# Patient Record
Sex: Male | Born: 1935
Health system: Southern US, Community
[De-identification: ages and names within clinical notes are randomized; demographics above are authoritative.]

## PROBLEM LIST (undated history)

## (undated) DIAGNOSIS — F32A Depression, unspecified: Secondary | ICD-10-CM

## (undated) DIAGNOSIS — J449 Chronic obstructive pulmonary disease, unspecified: Secondary | ICD-10-CM

## (undated) DIAGNOSIS — I10 Essential (primary) hypertension: Secondary | ICD-10-CM

## (undated) DIAGNOSIS — F319 Bipolar disorder, unspecified: Secondary | ICD-10-CM

## (undated) DIAGNOSIS — N400 Enlarged prostate without lower urinary tract symptoms: Secondary | ICD-10-CM

## (undated) DIAGNOSIS — N189 Chronic kidney disease, unspecified: Secondary | ICD-10-CM

## (undated) DIAGNOSIS — F329 Major depressive disorder, single episode, unspecified: Secondary | ICD-10-CM

## (undated) DIAGNOSIS — C801 Malignant (primary) neoplasm, unspecified: Secondary | ICD-10-CM

## (undated) DIAGNOSIS — E119 Type 2 diabetes mellitus without complications: Secondary | ICD-10-CM

## (undated) DIAGNOSIS — H269 Unspecified cataract: Secondary | ICD-10-CM

## (undated) HISTORY — DX: Benign prostatic hyperplasia without lower urinary tract symptoms: N40.0

## (undated) HISTORY — DX: Chronic obstructive pulmonary disease, unspecified: J44.9

## (undated) HISTORY — DX: Essential (primary) hypertension: I10

## (undated) HISTORY — DX: Chronic kidney disease, unspecified: N18.9

## (undated) HISTORY — DX: Type 2 diabetes mellitus without complications: E11.9

## (undated) HISTORY — DX: Unspecified cataract: H26.9

## (undated) HISTORY — DX: Bipolar disorder, unspecified: F31.9

## (undated) HISTORY — DX: Malignant (primary) neoplasm, unspecified: C80.1

## (undated) HISTORY — DX: Depression, unspecified: F32.A

## (undated) HISTORY — DX: Major depressive disorder, single episode, unspecified: F32.9

---

## 2000-10-23 ENCOUNTER — Inpatient Hospital Stay (HOSPITAL_COMMUNITY): Admission: EM | Admit: 2000-10-23 | Discharge: 2000-10-25 | Payer: Self-pay | Admitting: Emergency Medicine

## 2000-10-23 ENCOUNTER — Encounter: Payer: Self-pay | Admitting: Neurology

## 2000-10-23 ENCOUNTER — Encounter: Payer: Self-pay | Admitting: Emergency Medicine

## 2000-10-24 ENCOUNTER — Encounter: Payer: Self-pay | Admitting: Neurology

## 2001-07-18 ENCOUNTER — Ambulatory Visit (HOSPITAL_COMMUNITY): Admission: RE | Admit: 2001-07-18 | Discharge: 2001-07-18 | Payer: Self-pay | Admitting: *Deleted

## 2001-07-18 ENCOUNTER — Encounter (INDEPENDENT_AMBULATORY_CARE_PROVIDER_SITE_OTHER): Payer: Self-pay | Admitting: *Deleted

## 2004-09-10 ENCOUNTER — Ambulatory Visit (HOSPITAL_COMMUNITY): Admission: RE | Admit: 2004-09-10 | Discharge: 2004-09-10 | Payer: Self-pay | Admitting: *Deleted

## 2004-09-10 ENCOUNTER — Encounter (INDEPENDENT_AMBULATORY_CARE_PROVIDER_SITE_OTHER): Payer: Self-pay | Admitting: *Deleted

## 2007-03-22 ENCOUNTER — Ambulatory Visit (HOSPITAL_COMMUNITY): Admission: RE | Admit: 2007-03-22 | Discharge: 2007-03-22 | Payer: Self-pay | Admitting: Urology

## 2007-03-28 ENCOUNTER — Encounter (INDEPENDENT_AMBULATORY_CARE_PROVIDER_SITE_OTHER): Payer: Self-pay | Admitting: Urology

## 2007-03-28 ENCOUNTER — Inpatient Hospital Stay (HOSPITAL_COMMUNITY): Admission: RE | Admit: 2007-03-28 | Discharge: 2007-04-01 | Payer: Self-pay | Admitting: Urology

## 2007-09-24 ENCOUNTER — Ambulatory Visit (HOSPITAL_COMMUNITY): Admission: RE | Admit: 2007-09-24 | Discharge: 2007-09-24 | Payer: Self-pay | Admitting: Urology

## 2008-04-08 ENCOUNTER — Ambulatory Visit (HOSPITAL_COMMUNITY): Admission: RE | Admit: 2008-04-08 | Discharge: 2008-04-08 | Payer: Self-pay | Admitting: Urology

## 2008-10-10 ENCOUNTER — Ambulatory Visit (HOSPITAL_COMMUNITY): Admission: RE | Admit: 2008-10-10 | Discharge: 2008-10-10 | Payer: Self-pay | Admitting: Urology

## 2009-04-17 ENCOUNTER — Ambulatory Visit (HOSPITAL_COMMUNITY): Admission: RE | Admit: 2009-04-17 | Discharge: 2009-04-17 | Payer: Self-pay | Admitting: Urology

## 2009-11-17 ENCOUNTER — Ambulatory Visit (HOSPITAL_COMMUNITY): Admission: RE | Admit: 2009-11-17 | Discharge: 2009-11-17 | Payer: Self-pay | Admitting: Internal Medicine

## 2010-03-29 ENCOUNTER — Emergency Department (HOSPITAL_COMMUNITY): Admission: EM | Admit: 2010-03-29 | Discharge: 2010-03-29 | Payer: Self-pay | Admitting: Emergency Medicine

## 2010-12-14 NOTE — Consult Note (Signed)
NAME:  Blake Hines, Blake Hines NO.:  [id number]   MEDICAL RECORD NO.:  [id number]         PATIENT TYPE:  LINP   LOCATION:                               FACILITY:  [facility]   PHYSICIAN:  Felizardo Hoffmann, M.D.  DATE OF BIRTH:  [birth year]   DATE OF CONSULTATION:  03/30/2007  DATE OF DISCHARGE:                                 CONSULTATION   REASON FOR CONSULTATION:  Agitation.   REQUESTING PHYSICIAN:  Raynelle Bring, MD   HISTORY OF PRESENT ILLNESS:  Blake Hines is a 77 year old male admitted  to the [facility] on March 23, 2007, with a left renal mass   Blake Hines had been experiencing approximately 4 days of tangential  thought process with increased thought speed and expansive mood.  He has  also had elevated energy. He had been placed on Zyprexa 5 mg b.i.d.   His symptoms have improved.  He is now cooperative. He is not having any  thoughts of harming himself or others.  He is having no hallucinations  or delusions. He has constructive future goals   PAST PSYCHIATRIC HISTORY:  In review of the past medical record, in  10/27/2000, Blake Hines developed an alteration in his personality; he  had become more talkative with inappropriate comments. He also was  having memory difficulty at that time.   The patient was ultimately admitted to [facility]  for his manic psychosis.  He was treated with Zyprexa which was  eventually reduced down to a very small dose, but the patient has not  been taking the Zyprexa correctly.   FAMILY PSYCHIATRIC HISTORY:  One of the patient's children has bipolar  disorder.   The patient's father died of suicide.   SOCIAL HISTORY:  The patient has a history of drinking one mixed drink a  day   The patient is widowed.  His wife passed away in 10/28/03.  Occupation:  Retired English as a second language teacher.   [place]:  1. Type 2 diabetes.  2. Hypertension.  3. Benign prostatic hypertrophy.  4. History of colonic adenomas.  5.  Gilbert's syndrome.   ALLERGIES:  No known drug allergies.   MEDICATIONS:  The MAR is reviewed.  The patient is on Zyprexa 5 mg p.o.  b.i.d.   LABORATORY DATA:  TSH within normal limits.  Sodium 124, creatinine  1.59, BUN 7.  WBC 13.3, hemoglobin 12.4, platelet count 319.   REVIEW OF SYSTEMS:  Constitutional, head, eyes, ears, nose, throat,  mouth, neurologic, psychiatric, cardiovascular, respiratory,  gastrointestinal, genitourinary, skin, musculoskeletal, hematologic,  lymphatic, endocrine, metabolic all unremarkable.   PHYSICAL EXAMINATION:  VITAL SIGNS:  Temperature 98.1, pulse 109,  respiratory rate 20, blood pressure 136/88, O2 saturation on 2 liters  98%.  GENERAL APPEARANCE:  Blake Hines is an elderly male reclining in a supine  position in his hospital bed with no abnormal involuntary movements.   MENTAL STATUS EXAM:  Blake Hines is alert.  He is oriented to all  spheres.  His eye contact is good.  His attention span is within normal  limits.  His concentration is within normal limits.  His mood is  slightly anxious.  His affect is slightly anxious. He is oriented to all  spheres.  His fund of knowledge and intelligence are grossly within  normal limits.  Speech involves normal rate and prosody without  dysarthria.  Thought process is logical, coherent and goal directed.  No  looseness of associations.  Thought content:  No thoughts of harming  himself.  No thoughts of harming others. No delusions, no  hallucinations.  Insight is intact. Judgment is intact.   ASSESSMENT:  AXIS I:  (293.83) Mood disorder not otherwise specified  (history of manic symptoms accompanied with memory dysfunction but  recurring in a functional pattern similar to bipolar disorder). Now  stable after restarting Zyprexa.  AXIS II:  None.  AXIS III:  See Past Medical History.  AXIS IV:  General medical, primary support group.  AXIS V:  55.   Blake Hines is not at risk to harm himself or others.   He agrees to call  emergency services immediately for any psychiatric emergency symptoms.   RECOMMENDATIONS:  The patient's Zyprexa can be reduced to 5 mg p.o.  nightly for anti-psychosis and anti-mania as well as prevention.   Would have the patient follow up with one of the intensive outpatient  programs at [facility], [facility] or [facility]  psychiatric departments.      Felizardo Hoffmann, M.D.  Electronically Signed     JW/MEDQ  D:  12/09/2007  T:  12/09/2007  Job:  [id number]

## 2010-12-14 NOTE — Op Note (Signed)
NAME:  BRITT, BORST NO.:  [id number]   MEDICAL RECORD NO.:  [id number]          PATIENT TYPE:  INP   LOCATION:  [id number]                         FACILITY:  [facility]   PHYSICIAN:  Raynelle Bring, MD      DATE OF BIRTH:  25-Aug-1930   DATE OF PROCEDURE:  03/28/2007  DATE OF DISCHARGE:                               OPERATIVE REPORT   PREOPERATIVE DIAGNOSES:  1. Left renal mass.  2. Hematuria.   POSTOPERATIVE DIAGNOSES:  1. Left renal mass.  2. Hematuria.   PROCEDURE:  1. Left laparoscopic nephroureterectomy.  2. Retroperitoneal lymph node dissection (limited).   SURGEON:  Dr. Raynelle Bring.   ASSISTANT:  Dr. Finis Bud.   ANESTHESIA:  General.   COMPLICATIONS:  None.   ESTIMATED BLOOD LOSS:  175 mL.   SPECIMENS:  1. Left kidney and ureter with bladder cuff.  2. Periaortic and left renal hilar lymph nodes.   Disposition of specimens: Pathology.   DRAINS:  Number 15 Blake pelvic drain.   INDICATIONS:  Mr. Kaster is a 77 year old gentleman who recently  presented with hematuria and left-sided flank pain.  He underwent a CT  scan which demonstrated a left renal mass that was centrally located and  worrisome for a urothelial carcinoma versus possible centrally located  renal cell carcinoma.  He underwent cystoscopy which demonstrated no  bladder abnormalities, and his right kidney and ureter did not  demonstrate any concerning findings.  The patient underwent metastatic  evaluation which was negative except for some borderline retroperitoneal  lymphadenopathy which was concerning but not definite for metastatic  disease.  After discussing management options, the patient elected to  proceed with the above procedures.  Potential risks, complications and  alternative options were discussed with the patient.  Informed consent  was obtained.   DESCRIPTION OF PROCEDURE:  The patient was taken to the operating room,  and a general anesthetic was administered.   He was given preoperative  antibiotics, placed in the left modified flank position.  The patient's  abdomen was prepped and draped in the usual sterile fashion.  A  preoperative time-out was performed.  A site was selected just superior  to the umbilicus for placement of the camera port.  This was placed  using a standard open Hassan technique which allowed entry into the  peritoneal cavity under direct vision without difficulty.  A 10-mm  Hassan port was then placed and secured with 0 Vicryl fascial sutures.  A pneumoperitoneum was established, and the 30-degree lens was used to  inspect the abdomen which did not demonstrate any evidence of intra-  abdominal injuries or other abnormalities.  A 12-mm port was then placed  in the left lower quadrant and a 5-mm port was placed in the left upper  quadrant.  With the aid of the harmonic scalpel, the white line of Toldt  was incised along the length of the descending colon allowing the colon  be mobilized medially and the plane between the anterior layer of  Gerota's fascia and the colonic mesentery to be developed.  The patient  was noted on his preoperative imaging to have a urine leak medially  likely a result from his obstruction.  Therefore, an attempt was made to  stay out of Gerota's fascia.  The ureter and gonadal vein were  identified and lifted anteriorly off the psoas fascia.  There was noted  to be significant edema within the retroperitoneal fat.  However, the  dissection proceeded without tremendous difficulty.  Dissection then  continued superiorly toward the renal hilum.  The renal vein was  identified within the retroperitoneal fat.  Just below the level of the  renal vein, there was noted to be some lymphadenopathy.  The lymph node  tissue between the left renal vein, aorta and ureter was removed with  Hem-o-lok clips used for lymphostasis and hemostasis.  This was sent as  a permanent pathologic specimen.  A small lower pole  renal artery was  identified and was divided between multiple Hem-o-lok clips.  Dissection  then turned to the renal hilum where the main renal artery was  identified and ligated with multiple Hem-o-lok clips and was divided.  The renal vein was then isolated and divided with a 45-mm flex ETS  stapler with a vascular staple load.  This resulted in good hemostasis,  and Gerota's fascia was then intentionally entered superiorly allowing  the adrenal gland to be intentionally preserved.  The remainder of the  kidney was then dissected free with Gerota's fascia intact.  The gonadal  vein was then divided between multiple Hem-o-lok clips, and the ureter  was dissected inferiorly until it crossed the iliac vessels.  At this  point, the patient was placed in Trendelenburg position.  An additional  12-mm port was placed in the lower midline, and an additional 5-mm port  was placed in the left lower quadrant.  The bladder was then reflected  posteriorly, and the space of Retzius was entered on left side of the  bladder.  The ureter was dissected into the pelvis.  The vas deferens  was identified and divided between Hem-o-lok clips, and the superior  vesical artery on this side was identified and dissected and was divided  between multiple Hem-o-lok clips.  This allowed the ureter to be freed  down to a point just above its entry into the bladder.  At this point, 0  Vicryl sutures were used to close the 12-mm port sites.  The renal hilum  and adrenal gland were inspected prior to removal of ports and appeared  to be hemostatic.  A piece of Surgicel had been placed over the adrenal  bed previously.  All ports were then removed from the abdomen under  direct vision.  The 12-mm port site in the lower midline was then  extended allowing an opening just large enough for the kidney to be  removed from the abdomen.  A Bookwalter self-retaining retractor was  then placed.  The ureter was further dissected  until it was clearly  entering the bladder.  A figure-of-eight 2-0 Vicryl stitch was placed  well around the ureteral hiatus.  The ureter was then dissected off the  bladder, and the bladder was opened with Metzenbaum scissors, resulting  in a nice bladder cuff.  Unfortunately, the figure-of-eight suture was  also cut, and therefore, the opening in the bladder was closed with a  figure-of-eight 2-0 Vicryl suture followed by a second imbricating layer  of interrupted 2-0 Vicryl sutures.  The bladder was then filled with 200  mL of sterile saline, and no  leak was demonstrated.  Hemostasis appeared  excellent at this point.  A #15 Blake drain was brought through the left  lower quadrant 5-mm port site and positioned in the space of Retzius.  It was secured to the skin with a nylon suture.  The lower midline  incision was then closed with a running #1 PDS suture.  The superficial  wound was then  copiously irrigated, and all incision sites were closed with staples.  Sterile dressings were applied.  The patient appeared to tolerate the  procedure well and without complications.  He was able to be awakened  and transferred to the recovery unit in satisfactory condition.      Raynelle Bring, MD  Electronically Signed     LB/MEDQ  D:  03/28/2007  T:  03/29/2007  Job:  [id number]

## 2010-12-14 NOTE — Consult Note (Signed)
NAME:  BRIANNE, MUHS NO.:  [id number]   MEDICAL RECORD NO.:  [id number]          PATIENT TYPE:  INP   LOCATION:  [place]                         FACILITY:  [facility]   PHYSICIAN:  Mark A. Perini, M.D.   DATE OF BIRTH:  [birth year]   DATE OF CONSULTATION:  03/29/2007  DATE OF DISCHARGE:                                 CONSULTATION   REQUESTING PHYSICIAN:  Dr. Dutch Gray.   REASON FOR CONSULTATION:  Medical followup and mania.   HISTORY OF PRESENT ILLNESS:  Desean is a 77 year old male who presented  with gross hematuria last week.  He was found to have a renal mass  suspicious for carcinoma and underwent resection yesterday.  He states  his pain is well-controlled today; however, it has been noticed that he  has had increasing change in his affect with pressured speech as well.   PAST MEDICAL HISTORY:  1. Hypertension since 11/03/1990.  2. Dyslipidemia.  3. Psychotic manic episode in November 02, 2000 requiring inpatient stay at [facility].  He had good response to Zyprexa; however, he has not      been taking Zyprexa very much over the last year and when he does      take it, he takes a very small dose on the order of 1 to 1.5 mg in      the evening.  4. Type 2 diabetes.  5. Colonic adenomas.  6. Benign prostatic hypertrophy.  7. Non-melanoma skin cancer.  8. Gilbert's syndrome.  9. Right lower extremity sciatica in the past.   ALLERGIES:  LESCOL caused weakness.  MSG gives him problems.  ZOCOR  caused back pain and LIPITOR caused back pain.   MEDICATIONS AT HOME:  1. Hydrochlorothiazide 50 mg daily.  2. Atacand 16 mg daily.  3. Pepcid as needed.  4. Aspirin 325 mg every other day.  5. Multivitamin daily.  6. Fish oil daily.  7. Crestor one-half of a 5-mg pill two times a week.   MEDICATIONS IN THE HOSPITAL:  1. Dilaudid PCA.  2. Hydrochlorothiazide 50 mg daily.  3. Zocor 10 mg daily.  4. Ancef.  5. Cipro.   P.R.N. MEDICATIONS:  1. Protonix 40 mg daily.  2.  Sudafed 60 mg.  3. Zyprexa 5 mg twice a day.   SOCIAL HISTORY:  His wife' Collie Siad died in 2003/11/03 of lung cancer.  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  He quit tobacco in 11-02-04, although  according to his daughter, he still smokes an occasional cigarette.  He  uses some alcohol and no drug use.   FAMILY HISTORY:  Father died at 20 of suicide.  Mother died at 48 of  stroke and was in a nursing home.  He has no siblings.   PHYSICAL EXAM:  VITAL SIGNS:  Temperature 98.9, afebrile, pulse 93,  respiratory 16, blood pressure 125/74, SAT 98% on 1 L of oxygen.  He had  2300 mL in, 1600 mL out.  GENERAL:  He is sitting in a chair.  He is awake, alert.  Speech  is a  bit pressure.  His thoughts are a bit disordered and he seems to jump  from topic to topic too readily.  LUNGS:  Clear to auscultation bilaterally with no wheezes, rales or  rhonchi.  HEART:  Regular rate and rhythm with no murmur, rub or gallop.  ABDOMEN:  Soft, nontender.  He has wounds that are bandaged.  The  dressings are clean and dry.  EXTREMITIES:  He has no edema.  He moves extremities x4.   LABORATORY DATA:  White count 9.1, hemoglobin 12.4, platelet count  345,000.  Sodium 132, potassium 3.4, chloride 97, CO2 30, BUN 14,  creatinine 1.57, glucose 153.  GFR 43.  Calcium 7.8.   ASSESSMENT AND PLAN:  1. Renal mass and hematuria, status post resection, postoperative      management per Urology.  2. Hypertension.  He has always insisted on continuing on the higher-      dose hydrochlorothiazide; however, I will decrease it to 25 mg      daily, given his mild hyponatremia.  3. Nasal congestion.  I am concerned about using Sudafed, given his      hypertension history.  We will change to just nasal saline for now.  4. History of mania and psychosis.  He seems a bit manic at this time.      We will resume Zyprexa 5 mg twice daily and Dr. Rhona Raider is being      consulted as well.  5. Type 2 diabetes.  We will check his sugars  and follow along.  6. Pain control per Urology.  The timing of the discontinuation of the      patient-controlled analgesic pump will be per Urology.  7. We will replace potassium chloride.  We will follow with you.           ______________________________  Jeannette How. Joylene Draft, M.D.     MAP/MEDQ  D:  03/30/2007  T:  03/31/2007  Job:  [id number]

## 2010-12-17 NOTE — H&P (Signed)
[facility]  Patient:    Blake Hines, Blake Hines                          MRN: [id number] Adm. Date:  [id number] Attending:  Lenor Coffin CC:         Crist Infante, M.D.   History and Physical  HISTORY OF PRESENT ILLNESS:  Blake Hines is a 77 year old right-handed white male -- born 1931/06/10 -- with a history of progressive alteration in personality.  This patient was initially noted to become more talkative, which is out of character for him, about three to four weeks ago.  Within the last five days, patient has become inappropriate with a lot of his comments, that has significantly worsened over the last two days prior to this admission. Patient has been somewhat forgetful, cannot remember where important business papers have been kept and has lost his billfold.  The patient is laughing inappropriately quite a bit.  The problem has continually worsened and this patient is brought in for an evaluation.  CT of the head is unremarkable. Neurology was asked to see the patient for an evaluation.  PAST MEDICAL HISTORY: 1. History of altered personality, unusual affect, as above. 2. History of hypertension. 3. Tobacco abuse.  MEDICATIONS:  Medications at this time include hydrochlorothiazide 25 mg a day.  SOCIAL HISTORY:  Tobacco:  One-half pack of cigarettes a day.  Alcohol:  One drink of bourbon daily.  According to the wife, patient does not drink to excess.  This patient lives in the [place] area, is employed, lives with his wife, has two children.  One daughter is alive and well.  One son has a history of bipolar disorder.  ALLERGIES:  Patient has no known allergies.  FAMILY MEDICAL HISTORY:  Notable that mother has history of recurrent bladder infections and GU sepsis.  Father died following suicide.  Patient has no brothers or sisters.  REVIEW OF SYSTEMS:  Notable for no fevers or chills.  Patient had the flu three weeks ago lasting  about a week.  Patient denies any headache.  Blurred vision is present but is mild.  Patient has some ongoing neck pain and neck stiffness.  Denies shortness of breath, chest pains.  Denies any problems with nausea or vomiting.  Has some trouble with bowels.  Denies any bladder control problems.  Denies blackout episodes, dizziness.  PHYSICAL EXAMINATION:  VITALS:  Blood pressure is 168/103.  Heart rate 106 and regular.  Respiratory rate 22.  Temperature:  Afebrile.  GENERAL:  This patient is a well-developed white male who is somewhat diaphoretic, nervous appearing at the time of examination.  HEENT:  Head is atraumatic.  Eyes:  Pupils are equal, round and react to light.  Disks are flat bilaterally.  NECK:  Supple.  No carotid bruits noted.  RESPIRATORY:  Clear to auscultation and percussion.  CARDIOVASCULAR:  Regular rate and rhythm without obvious murmurs or rubs.  EXTREMITIES:  Without significant edema.  ABDOMEN:  Positive bowel sounds.  No organomegaly or tenderness is noted.  NEUROLOGIC:  Cranial nerves as above.  Facial symmetry is present.  Patient has a normal pinprick sensation of the face, has full extraocular movements, has full visual fields.  Speech is well-enunciated and not aphasic.  Patient has normal strength in all four extremities.  Good and symmetric motor tone is noted throughout.  Sensory testing is intact to pinprick, soft touch and vibratory sensation  throughout.  Cerebellar testing reveals normal finger-to-nose-to-finger, heel-to-shin.  No pronator drift is seen.  Patient was not ambulated.  Deep tendon reflexes are symmetric.  Toes are downgoing bilaterally.  No asterixis was seen on todays evaluation.  LABORATORY AND X-RAY FINDINGS:  CT of the head is unremarkable.  Negative urine toxin screen.  Blood work reveals WBC of 8.4, hemoglobin 15.2, hematocrit 44.5, MCV of 85.1, platelets of 317,000; sodium 134, potassium 3.1, chloride of 99, CO2 27,  glucose of 97, BUN of 13, creatinine 1.1, calcium 9.1, total protein 6.4, albumin of 4.0, AST of 24, ALT of 27, alkaline phosphatase of 36, total bilirubin 1.7.  EKG and chest x-ray are pending at this time.  Blood gas reveals pH of 7.487, PCO2 of 37, PO2 of 79.  IMPRESSION: 1. History of personality alteration, etiology unclear. 2. History of hypertension.  Mini-Mental Status Examination shows that this patient is basically fully oriented, is able to recall three-of-three words at five minutes, has fair math function, repeats well, names well, can follow three-step commands. Patients speech is somewhat hesitant and slightly inappropriate.  By history, the patients alteration in personality occurred over several weeks.  Need to rule out a low-grade glioma process not apparent on CT, rule out small vessel ischemic changes and rule out primary psychiatric disease. Also need to consider possibility of metabolic disturbance like thyroid disease or possibility of alcohol withdrawal, although patient claims he does not drink more than one 1-ounce drink per night.  Patient has been on no other medications he might withdraw from.  PLAN: 1. Admission to [facility]. 2. MRI scan of the brain. 3. EEG study. 4. Check blood work for TSH, RPR, B12, folate level, sed rate, CEA level. 5. Administer thiamine and will follow patients clinical course while in    house. DD:  10/23/00 TD:  10/24/00 Job: [id number]

## 2010-12-17 NOTE — Discharge Summary (Signed)
[facility]  Patient:    Blake Hines, Blake Hines                          MRN: [id number] Adm. Date:  [id number] Disc. Date: [id number] Attending:  Lenor Coffin                           Discharge Summary  ADMITTING DIAGNOSES: 1. Behavioral alteration, etiology unclear. 2. Hypertension. 3. Tobacco abuse.  DISCHARGE DIAGNOSES: 1. Altered personality, possible depression DD:  10/25/00 TD:  10/25/00 Job: [id number]

## 2010-12-17 NOTE — Discharge Summary (Signed)
NAMEBRITTIAN, Blake Hines NO.:  [id number]   MEDICAL RECORD NO.:  [id number]          PATIENT TYPE:  INP   LOCATION:  [id number]                         FACILITY:  [facility]   PHYSICIAN:  Raynelle Bring, MD      DATE OF BIRTH:  [birth year]   DATE OF ADMISSION:  03/28/2007  DATE OF DISCHARGE:  04/01/2007                               DISCHARGE SUMMARY   ADMISSION DIAGNOSES:  1. Hematuria.  2. Left renal mass.   DISCHARGE DIAGNOSES:  1. Hematuria.  2. Renal cell carcinoma.   HISTORY AND PHYSICAL:  For full details, please see admission History  and Physical.  Briefly, Mr. Blake Hines is a 77 year old gentleman who  presented with gross hematuria and left-sided flank pain.  On  evaluation, he was found to have an enhancing left renal mass worrisome  for a malignancy.  Due to the central location of the patient's renal  mass, this was felt to represent a possible urothelial carcinoma versus  a centrally located renal cell carcinoma.  He underwent a metastatic  evaluation which did demonstrate some retroperitoneal lymphadenopathy,  although nothing that was definitive for metastatic disease.  The  remainder of his metastatic evaluation was negative.  After discussing  options, the patient elected to proceed with a left nephroureterectomy  due to the high likelihood that this could be a urothelial carcinoma.   HOSPITAL COURSE:  On March 28, 2007, the patient was taken to the  operating room, and a left laparoscopic nephroureterectomy was  performed.  The patient tolerated this procedure well without  complications.  A regional lymphadenectomy was also performed for  staging purposes.  Postoperatively, the patient was able to be  transferred to a regular hospital room following recovery from  anesthesia.  He was monitored carefully and remained hemodynamically  stable over the first 24 hours.  Specifically, his hematocrit remained  stable at 35.4.  He was noted to have and an  increase in his serum  creatinine which stabilized. Toward the end of his hospitalization, it  was found to be 1.78 on April 01, 2007.   On postoperative day #1, the patient was also noted to exhibit pressured  speech as well as demonstrating sleeplessness which had been consistent  even in the days preceding surgery according to his daughter.  He did  have a known history of bipolar disorder with significant manic episodes  in the past which have been managed by his primary care physician, Dr.  Crist Hines.  Dr. Joylene Hines was, therefore, consulted and did recommend  that the patient restart his Zyprexa which he had been hesitant to take  on a regular basis in the past.   Over the course of the next a couple of days, the patient's diet was  gradually advanced as tolerated.  He was able to begin ambulating which  he did without difficulty, and his pain medication was able to be  transitioned to oral pain control.  A creatinine level was checked from  his pelvic drain and was found to be consistent with serum at 1.6.  His  drain  was, therefore, removed.  He also underwent a cystogram which did  not demonstrate any leak and, therefore, his Foley catheter was able to  be discontinued prior to his discharge.  By April 01, 2007, the patient  had met all discharge criteria and appeared to be stable from a medical  and psychiatric standpoint.  He was, therefore, able to be discharged  home in excellent condition.   SURGICAL PATHOLOGY:  The patient's pathology subsequently did  demonstrate a clear cell renal cell carcinoma with negative margins.  Regional lymph nodes were negative for metastatic disease.   DISPOSITION:  Home.   DISCHARGE MEDICATIONS:  The patient was given a prescription to take  Vicodin as needed for pain and Colace as a stool softener.  He was told  to resume his regular home medications except any aspirin, nonsteroidal  anti-inflammatory drugs, or herbal supplements.  He  was instructed to  resume his Zyprexa at 5 mg p.o. twice daily.   DISCHARGE INSTRUCTIONS:  The patient was instructed to refrain from any  heavy lifting, strenuous activity, or driving.  He was told to be on a  regular diet as tolerated.   FOLLOW UP:  Blake Hines will follow up in approximately 1 week for  removal of the staples and for further discussion of his surgical  pathology.  I also will check his renal function at that time.      Raynelle Bring, MD  Electronically Signed     LB/MEDQ  D:  04/02/2007  T:  04/02/2007  Job:  [id number]   cc:   Elta Guadeloupe A. Perini, M.D.  Fax: [phone]

## 2011-05-13 LAB — BASIC METABOLIC PANEL WITH GFR
BUN: 13
CO2: 32
Calcium: 8.3 — ABNORMAL LOW
Chloride: 87 — ABNORMAL LOW
Creatinine, Ser: 1.78 — ABNORMAL HIGH
GFR calc non Af Amer: 38 — ABNORMAL LOW
Glucose, Bld: 134 — ABNORMAL HIGH
Potassium: 3.8
Sodium: 126 — ABNORMAL LOW

## 2011-05-13 LAB — BASIC METABOLIC PANEL
BUN: 13
BUN: 16
CO2: 29
CO2: 29
CO2: 29
Calcium: 7.7 — ABNORMAL LOW
Calcium: 7.8 — ABNORMAL LOW
Calcium: 8.3 — ABNORMAL LOW
Calcium: 9.2
Chloride: 85 — ABNORMAL LOW
Chloride: 97
Creatinine, Ser: 1.5
Creatinine, Ser: 1.57 — ABNORMAL HIGH
Creatinine, Ser: 1.81 — ABNORMAL HIGH
GFR calc Af Amer: 53 — ABNORMAL LOW
GFR calc Af Amer: 55 — ABNORMAL LOW
GFR calc non Af Amer: 37 — ABNORMAL LOW
GFR calc non Af Amer: 46 — ABNORMAL LOW
Glucose, Bld: 116 — ABNORMAL HIGH
Glucose, Bld: 161 — ABNORMAL HIGH
Glucose, Bld: 264 — ABNORMAL HIGH
Potassium: 3.3 — ABNORMAL LOW
Potassium: 3.4 — ABNORMAL LOW
Sodium: 122 — ABNORMAL LOW
Sodium: 124 — ABNORMAL LOW
Sodium: 125 — ABNORMAL LOW
Sodium: 132 — ABNORMAL LOW

## 2011-05-13 LAB — CBC
HCT: 35.4 — ABNORMAL LOW
HCT: 35.4 — ABNORMAL LOW
HCT: 35.4 — ABNORMAL LOW
Hemoglobin: 12.4 — ABNORMAL LOW
Hemoglobin: 12.4 — ABNORMAL LOW
Hemoglobin: 14.9
MCHC: 34.1
MCHC: 35
MCV: 85.4
MCV: 86
MCV: 86
MCV: 86.5
Platelets: 319
Platelets: 378
RBC: 4.14 — ABNORMAL LOW
RDW: 11.8
RDW: 12.1
WBC: 13.3 — ABNORMAL HIGH
WBC: 18.5 — ABNORMAL HIGH
WBC: 9.1

## 2011-05-13 LAB — ABO/RH: ABO/RH(D): O POS

## 2011-05-13 LAB — DIFFERENTIAL
Basophils Absolute: 0
Basophils Relative: 0
Eosinophils Relative: 1
Monocytes Absolute: 0.8 — ABNORMAL HIGH

## 2011-05-13 LAB — TYPE AND SCREEN

## 2011-08-17 DIAGNOSIS — C649 Malignant neoplasm of unspecified kidney, except renal pelvis: Secondary | ICD-10-CM | POA: Diagnosis not present

## 2011-10-06 DIAGNOSIS — R7989 Other specified abnormal findings of blood chemistry: Secondary | ICD-10-CM | POA: Diagnosis not present

## 2011-10-06 DIAGNOSIS — E785 Hyperlipidemia, unspecified: Secondary | ICD-10-CM | POA: Diagnosis not present

## 2011-12-05 DIAGNOSIS — N184 Chronic kidney disease, stage 4 (severe): Secondary | ICD-10-CM | POA: Diagnosis not present

## 2011-12-05 DIAGNOSIS — I10 Essential (primary) hypertension: Secondary | ICD-10-CM | POA: Diagnosis not present

## 2011-12-05 DIAGNOSIS — F329 Major depressive disorder, single episode, unspecified: Secondary | ICD-10-CM | POA: Diagnosis not present

## 2011-12-05 DIAGNOSIS — E119 Type 2 diabetes mellitus without complications: Secondary | ICD-10-CM | POA: Diagnosis not present

## 2012-04-06 DIAGNOSIS — Z125 Encounter for screening for malignant neoplasm of prostate: Secondary | ICD-10-CM | POA: Diagnosis not present

## 2012-04-06 DIAGNOSIS — E119 Type 2 diabetes mellitus without complications: Secondary | ICD-10-CM | POA: Diagnosis not present

## 2012-04-06 DIAGNOSIS — E785 Hyperlipidemia, unspecified: Secondary | ICD-10-CM | POA: Diagnosis not present

## 2012-04-06 DIAGNOSIS — I10 Essential (primary) hypertension: Secondary | ICD-10-CM | POA: Diagnosis not present

## 2012-04-12 DIAGNOSIS — E119 Type 2 diabetes mellitus without complications: Secondary | ICD-10-CM | POA: Diagnosis not present

## 2012-04-12 DIAGNOSIS — J449 Chronic obstructive pulmonary disease, unspecified: Secondary | ICD-10-CM | POA: Diagnosis not present

## 2012-04-12 DIAGNOSIS — Z23 Encounter for immunization: Secondary | ICD-10-CM | POA: Diagnosis not present

## 2012-04-12 DIAGNOSIS — Z125 Encounter for screening for malignant neoplasm of prostate: Secondary | ICD-10-CM | POA: Diagnosis not present

## 2012-04-12 DIAGNOSIS — N184 Chronic kidney disease, stage 4 (severe): Secondary | ICD-10-CM | POA: Diagnosis not present

## 2012-04-12 DIAGNOSIS — Z1212 Encounter for screening for malignant neoplasm of rectum: Secondary | ICD-10-CM | POA: Diagnosis not present

## 2012-04-12 DIAGNOSIS — Z0000 Encounter for general adult medical examination without abnormal findings: Secondary | ICD-10-CM | POA: Diagnosis not present

## 2012-04-16 ENCOUNTER — Ambulatory Visit (HOSPITAL_COMMUNITY)
Admission: RE | Admit: 2012-04-16 | Discharge: 2012-04-16 | Disposition: A | Discharge status: Home / Self Care | Payer: Medicare Other | Source: Ambulatory Visit | Attending: Urology | Admitting: Urology

## 2012-04-16 ENCOUNTER — Other Ambulatory Visit (HOSPITAL_COMMUNITY): Payer: Self-pay | Admitting: Urology

## 2012-04-16 DIAGNOSIS — C649 Malignant neoplasm of unspecified kidney, except renal pelvis: Secondary | ICD-10-CM

## 2012-04-17 DIAGNOSIS — N281 Cyst of kidney, acquired: Secondary | ICD-10-CM | POA: Diagnosis not present

## 2012-04-17 DIAGNOSIS — C649 Malignant neoplasm of unspecified kidney, except renal pelvis: Secondary | ICD-10-CM | POA: Diagnosis not present

## 2012-04-20 DIAGNOSIS — C649 Malignant neoplasm of unspecified kidney, except renal pelvis: Secondary | ICD-10-CM | POA: Diagnosis not present

## 2012-10-12 DIAGNOSIS — C649 Malignant neoplasm of unspecified kidney, except renal pelvis: Secondary | ICD-10-CM | POA: Diagnosis not present

## 2013-01-23 DIAGNOSIS — E119 Type 2 diabetes mellitus without complications: Secondary | ICD-10-CM | POA: Diagnosis not present

## 2013-04-22 DIAGNOSIS — E119 Type 2 diabetes mellitus without complications: Secondary | ICD-10-CM | POA: Diagnosis not present

## 2013-04-22 DIAGNOSIS — Z125 Encounter for screening for malignant neoplasm of prostate: Secondary | ICD-10-CM | POA: Diagnosis not present

## 2013-04-22 DIAGNOSIS — E785 Hyperlipidemia, unspecified: Secondary | ICD-10-CM | POA: Diagnosis not present

## 2013-04-22 DIAGNOSIS — I10 Essential (primary) hypertension: Secondary | ICD-10-CM | POA: Diagnosis not present

## 2013-05-03 DIAGNOSIS — Z6828 Body mass index (BMI) 28.0-28.9, adult: Secondary | ICD-10-CM | POA: Diagnosis not present

## 2013-05-03 DIAGNOSIS — Z125 Encounter for screening for malignant neoplasm of prostate: Secondary | ICD-10-CM | POA: Diagnosis not present

## 2013-05-03 DIAGNOSIS — Z23 Encounter for immunization: Secondary | ICD-10-CM | POA: Diagnosis not present

## 2013-05-03 DIAGNOSIS — Z1331 Encounter for screening for depression: Secondary | ICD-10-CM | POA: Diagnosis not present

## 2013-05-03 DIAGNOSIS — Z0000 Encounter for general adult medical examination without abnormal findings: Secondary | ICD-10-CM | POA: Diagnosis not present

## 2013-05-03 DIAGNOSIS — F329 Major depressive disorder, single episode, unspecified: Secondary | ICD-10-CM | POA: Diagnosis not present

## 2013-05-03 DIAGNOSIS — E119 Type 2 diabetes mellitus without complications: Secondary | ICD-10-CM | POA: Diagnosis not present

## 2013-05-03 DIAGNOSIS — E785 Hyperlipidemia, unspecified: Secondary | ICD-10-CM | POA: Diagnosis not present

## 2013-05-03 DIAGNOSIS — N184 Chronic kidney disease, stage 4 (severe): Secondary | ICD-10-CM | POA: Diagnosis not present

## 2013-05-03 DIAGNOSIS — J449 Chronic obstructive pulmonary disease, unspecified: Secondary | ICD-10-CM | POA: Diagnosis not present

## 2013-05-07 DIAGNOSIS — Z1212 Encounter for screening for malignant neoplasm of rectum: Secondary | ICD-10-CM | POA: Diagnosis not present

## 2013-11-11 DIAGNOSIS — I10 Essential (primary) hypertension: Secondary | ICD-10-CM | POA: Diagnosis not present

## 2013-11-11 DIAGNOSIS — N184 Chronic kidney disease, stage 4 (severe): Secondary | ICD-10-CM | POA: Diagnosis not present

## 2013-11-11 DIAGNOSIS — J449 Chronic obstructive pulmonary disease, unspecified: Secondary | ICD-10-CM | POA: Diagnosis not present

## 2013-11-11 DIAGNOSIS — Z6827 Body mass index (BMI) 27.0-27.9, adult: Secondary | ICD-10-CM | POA: Diagnosis not present

## 2013-11-11 DIAGNOSIS — E785 Hyperlipidemia, unspecified: Secondary | ICD-10-CM | POA: Diagnosis not present

## 2013-11-11 DIAGNOSIS — E119 Type 2 diabetes mellitus without complications: Secondary | ICD-10-CM | POA: Diagnosis not present

## 2013-11-20 DIAGNOSIS — Z23 Encounter for immunization: Secondary | ICD-10-CM | POA: Diagnosis not present

## 2014-04-24 DIAGNOSIS — Z23 Encounter for immunization: Secondary | ICD-10-CM | POA: Diagnosis not present

## 2014-04-28 DIAGNOSIS — L570 Actinic keratosis: Secondary | ICD-10-CM | POA: Diagnosis not present

## 2014-04-28 DIAGNOSIS — Z85828 Personal history of other malignant neoplasm of skin: Secondary | ICD-10-CM | POA: Diagnosis not present

## 2014-04-28 DIAGNOSIS — L821 Other seborrheic keratosis: Secondary | ICD-10-CM | POA: Diagnosis not present

## 2014-05-20 DIAGNOSIS — Z125 Encounter for screening for malignant neoplasm of prostate: Secondary | ICD-10-CM | POA: Diagnosis not present

## 2014-05-20 DIAGNOSIS — E785 Hyperlipidemia, unspecified: Secondary | ICD-10-CM | POA: Diagnosis not present

## 2014-05-20 DIAGNOSIS — E119 Type 2 diabetes mellitus without complications: Secondary | ICD-10-CM | POA: Diagnosis not present

## 2014-05-20 DIAGNOSIS — I10 Essential (primary) hypertension: Secondary | ICD-10-CM | POA: Diagnosis not present

## 2014-05-27 DIAGNOSIS — E119 Type 2 diabetes mellitus without complications: Secondary | ICD-10-CM | POA: Diagnosis not present

## 2014-05-27 DIAGNOSIS — Z1389 Encounter for screening for other disorder: Secondary | ICD-10-CM | POA: Diagnosis not present

## 2014-05-27 DIAGNOSIS — Z1212 Encounter for screening for malignant neoplasm of rectum: Secondary | ICD-10-CM | POA: Diagnosis not present

## 2014-05-27 DIAGNOSIS — E785 Hyperlipidemia, unspecified: Secondary | ICD-10-CM | POA: Diagnosis not present

## 2014-05-27 DIAGNOSIS — Z008 Encounter for other general examination: Secondary | ICD-10-CM | POA: Diagnosis not present

## 2014-05-27 DIAGNOSIS — H9190 Unspecified hearing loss, unspecified ear: Secondary | ICD-10-CM | POA: Diagnosis not present

## 2014-05-27 DIAGNOSIS — J449 Chronic obstructive pulmonary disease, unspecified: Secondary | ICD-10-CM | POA: Diagnosis not present

## 2014-05-27 DIAGNOSIS — I10 Essential (primary) hypertension: Secondary | ICD-10-CM | POA: Diagnosis not present

## 2014-05-27 DIAGNOSIS — N184 Chronic kidney disease, stage 4 (severe): Secondary | ICD-10-CM | POA: Diagnosis not present

## 2014-05-27 DIAGNOSIS — F329 Major depressive disorder, single episode, unspecified: Secondary | ICD-10-CM | POA: Diagnosis not present

## 2014-08-27 DIAGNOSIS — I10 Essential (primary) hypertension: Secondary | ICD-10-CM | POA: Diagnosis not present

## 2014-08-27 DIAGNOSIS — E119 Type 2 diabetes mellitus without complications: Secondary | ICD-10-CM | POA: Diagnosis not present

## 2014-08-27 DIAGNOSIS — Z6826 Body mass index (BMI) 26.0-26.9, adult: Secondary | ICD-10-CM | POA: Diagnosis not present

## 2014-10-08 DIAGNOSIS — E119 Type 2 diabetes mellitus without complications: Secondary | ICD-10-CM | POA: Diagnosis not present

## 2014-10-08 DIAGNOSIS — I10 Essential (primary) hypertension: Secondary | ICD-10-CM | POA: Diagnosis not present

## 2014-10-08 DIAGNOSIS — Z6827 Body mass index (BMI) 27.0-27.9, adult: Secondary | ICD-10-CM | POA: Diagnosis not present

## 2014-10-08 DIAGNOSIS — N184 Chronic kidney disease, stage 4 (severe): Secondary | ICD-10-CM | POA: Diagnosis not present

## 2014-11-24 DIAGNOSIS — Z1389 Encounter for screening for other disorder: Secondary | ICD-10-CM | POA: Diagnosis not present

## 2014-11-24 DIAGNOSIS — N184 Chronic kidney disease, stage 4 (severe): Secondary | ICD-10-CM | POA: Diagnosis not present

## 2014-11-24 DIAGNOSIS — I10 Essential (primary) hypertension: Secondary | ICD-10-CM | POA: Diagnosis not present

## 2014-11-24 DIAGNOSIS — F3173 Bipolar disorder, in partial remission, most recent episode manic: Secondary | ICD-10-CM | POA: Diagnosis not present

## 2014-11-24 DIAGNOSIS — J449 Chronic obstructive pulmonary disease, unspecified: Secondary | ICD-10-CM | POA: Diagnosis not present

## 2014-11-24 DIAGNOSIS — F329 Major depressive disorder, single episode, unspecified: Secondary | ICD-10-CM | POA: Diagnosis not present

## 2014-11-24 DIAGNOSIS — Z6826 Body mass index (BMI) 26.0-26.9, adult: Secondary | ICD-10-CM | POA: Diagnosis not present

## 2014-11-24 DIAGNOSIS — E1129 Type 2 diabetes mellitus with other diabetic kidney complication: Secondary | ICD-10-CM | POA: Diagnosis not present

## 2015-03-26 DIAGNOSIS — Z6826 Body mass index (BMI) 26.0-26.9, adult: Secondary | ICD-10-CM | POA: Diagnosis not present

## 2015-03-26 DIAGNOSIS — N184 Chronic kidney disease, stage 4 (severe): Secondary | ICD-10-CM | POA: Diagnosis not present

## 2015-03-26 DIAGNOSIS — E1129 Type 2 diabetes mellitus with other diabetic kidney complication: Secondary | ICD-10-CM | POA: Diagnosis not present

## 2015-03-26 DIAGNOSIS — I10 Essential (primary) hypertension: Secondary | ICD-10-CM | POA: Diagnosis not present

## 2015-03-26 DIAGNOSIS — E785 Hyperlipidemia, unspecified: Secondary | ICD-10-CM | POA: Diagnosis not present

## 2015-03-26 DIAGNOSIS — J449 Chronic obstructive pulmonary disease, unspecified: Secondary | ICD-10-CM | POA: Diagnosis not present

## 2015-03-26 DIAGNOSIS — F329 Major depressive disorder, single episode, unspecified: Secondary | ICD-10-CM | POA: Diagnosis not present

## 2015-04-07 DIAGNOSIS — Z23 Encounter for immunization: Secondary | ICD-10-CM | POA: Diagnosis not present

## 2015-04-29 DIAGNOSIS — L821 Other seborrheic keratosis: Secondary | ICD-10-CM | POA: Diagnosis not present

## 2015-04-29 DIAGNOSIS — L570 Actinic keratosis: Secondary | ICD-10-CM | POA: Diagnosis not present

## 2015-04-29 DIAGNOSIS — D1801 Hemangioma of skin and subcutaneous tissue: Secondary | ICD-10-CM | POA: Diagnosis not present

## 2015-06-01 DIAGNOSIS — Z6827 Body mass index (BMI) 27.0-27.9, adult: Secondary | ICD-10-CM | POA: Diagnosis not present

## 2015-06-01 DIAGNOSIS — J029 Acute pharyngitis, unspecified: Secondary | ICD-10-CM | POA: Diagnosis not present

## 2015-06-01 DIAGNOSIS — J209 Acute bronchitis, unspecified: Secondary | ICD-10-CM | POA: Diagnosis not present

## 2015-07-09 DIAGNOSIS — E1129 Type 2 diabetes mellitus with other diabetic kidney complication: Secondary | ICD-10-CM | POA: Diagnosis not present

## 2015-07-09 DIAGNOSIS — Z125 Encounter for screening for malignant neoplasm of prostate: Secondary | ICD-10-CM | POA: Diagnosis not present

## 2015-07-09 DIAGNOSIS — E784 Other hyperlipidemia: Secondary | ICD-10-CM | POA: Diagnosis not present

## 2015-07-09 DIAGNOSIS — N184 Chronic kidney disease, stage 4 (severe): Secondary | ICD-10-CM | POA: Diagnosis not present

## 2015-07-16 DIAGNOSIS — Z0000 Encounter for general adult medical examination without abnormal findings: Secondary | ICD-10-CM | POA: Diagnosis not present

## 2015-07-16 DIAGNOSIS — J449 Chronic obstructive pulmonary disease, unspecified: Secondary | ICD-10-CM | POA: Diagnosis not present

## 2015-07-16 DIAGNOSIS — Z1389 Encounter for screening for other disorder: Secondary | ICD-10-CM | POA: Diagnosis not present

## 2015-07-16 DIAGNOSIS — H9190 Unspecified hearing loss, unspecified ear: Secondary | ICD-10-CM | POA: Diagnosis not present

## 2015-07-16 DIAGNOSIS — N184 Chronic kidney disease, stage 4 (severe): Secondary | ICD-10-CM | POA: Diagnosis not present

## 2015-07-16 DIAGNOSIS — F329 Major depressive disorder, single episode, unspecified: Secondary | ICD-10-CM | POA: Diagnosis not present

## 2015-07-16 DIAGNOSIS — E1129 Type 2 diabetes mellitus with other diabetic kidney complication: Secondary | ICD-10-CM | POA: Diagnosis not present

## 2015-07-16 DIAGNOSIS — E784 Other hyperlipidemia: Secondary | ICD-10-CM | POA: Diagnosis not present

## 2015-07-16 DIAGNOSIS — F3173 Bipolar disorder, in partial remission, most recent episode manic: Secondary | ICD-10-CM | POA: Diagnosis not present

## 2015-07-16 DIAGNOSIS — Z6826 Body mass index (BMI) 26.0-26.9, adult: Secondary | ICD-10-CM | POA: Diagnosis not present

## 2015-09-08 DIAGNOSIS — E119 Type 2 diabetes mellitus without complications: Secondary | ICD-10-CM | POA: Diagnosis not present

## 2015-09-08 DIAGNOSIS — H524 Presbyopia: Secondary | ICD-10-CM | POA: Diagnosis not present

## 2015-12-14 DIAGNOSIS — E119 Type 2 diabetes mellitus without complications: Secondary | ICD-10-CM | POA: Diagnosis not present

## 2015-12-14 DIAGNOSIS — I10 Essential (primary) hypertension: Secondary | ICD-10-CM | POA: Diagnosis not present

## 2015-12-14 DIAGNOSIS — J449 Chronic obstructive pulmonary disease, unspecified: Secondary | ICD-10-CM | POA: Diagnosis not present

## 2015-12-14 DIAGNOSIS — R05 Cough: Secondary | ICD-10-CM | POA: Diagnosis not present

## 2015-12-14 DIAGNOSIS — Z6826 Body mass index (BMI) 26.0-26.9, adult: Secondary | ICD-10-CM | POA: Diagnosis not present

## 2015-12-14 DIAGNOSIS — J441 Chronic obstructive pulmonary disease with (acute) exacerbation: Secondary | ICD-10-CM | POA: Diagnosis not present

## 2015-12-14 DIAGNOSIS — E1129 Type 2 diabetes mellitus with other diabetic kidney complication: Secondary | ICD-10-CM | POA: Diagnosis not present

## 2015-12-18 DIAGNOSIS — J441 Chronic obstructive pulmonary disease with (acute) exacerbation: Secondary | ICD-10-CM | POA: Diagnosis not present

## 2015-12-18 DIAGNOSIS — Z6826 Body mass index (BMI) 26.0-26.9, adult: Secondary | ICD-10-CM | POA: Diagnosis not present

## 2016-01-19 DIAGNOSIS — F329 Major depressive disorder, single episode, unspecified: Secondary | ICD-10-CM | POA: Diagnosis not present

## 2016-01-19 DIAGNOSIS — Z1389 Encounter for screening for other disorder: Secondary | ICD-10-CM | POA: Diagnosis not present

## 2016-01-19 DIAGNOSIS — J441 Chronic obstructive pulmonary disease with (acute) exacerbation: Secondary | ICD-10-CM | POA: Diagnosis not present

## 2016-01-19 DIAGNOSIS — E1129 Type 2 diabetes mellitus with other diabetic kidney complication: Secondary | ICD-10-CM | POA: Diagnosis not present

## 2016-03-22 DIAGNOSIS — H1131 Conjunctival hemorrhage, right eye: Secondary | ICD-10-CM | POA: Diagnosis not present

## 2016-04-14 DIAGNOSIS — Z23 Encounter for immunization: Secondary | ICD-10-CM | POA: Diagnosis not present

## 2016-04-28 DIAGNOSIS — L821 Other seborrheic keratosis: Secondary | ICD-10-CM | POA: Diagnosis not present

## 2016-04-28 DIAGNOSIS — L570 Actinic keratosis: Secondary | ICD-10-CM | POA: Diagnosis not present

## 2016-04-28 DIAGNOSIS — Z85828 Personal history of other malignant neoplasm of skin: Secondary | ICD-10-CM | POA: Diagnosis not present

## 2016-07-29 DIAGNOSIS — N184 Chronic kidney disease, stage 4 (severe): Secondary | ICD-10-CM | POA: Diagnosis not present

## 2016-07-29 DIAGNOSIS — E784 Other hyperlipidemia: Secondary | ICD-10-CM | POA: Diagnosis not present

## 2016-07-29 DIAGNOSIS — E1129 Type 2 diabetes mellitus with other diabetic kidney complication: Secondary | ICD-10-CM | POA: Diagnosis not present

## 2016-07-29 DIAGNOSIS — Z125 Encounter for screening for malignant neoplasm of prostate: Secondary | ICD-10-CM | POA: Diagnosis not present

## 2016-07-29 DIAGNOSIS — R946 Abnormal results of thyroid function studies: Secondary | ICD-10-CM | POA: Diagnosis not present

## 2016-08-05 DIAGNOSIS — I10 Essential (primary) hypertension: Secondary | ICD-10-CM | POA: Diagnosis not present

## 2016-08-05 DIAGNOSIS — Z6827 Body mass index (BMI) 27.0-27.9, adult: Secondary | ICD-10-CM | POA: Diagnosis not present

## 2016-08-05 DIAGNOSIS — N184 Chronic kidney disease, stage 4 (severe): Secondary | ICD-10-CM | POA: Diagnosis not present

## 2016-08-05 DIAGNOSIS — E784 Other hyperlipidemia: Secondary | ICD-10-CM | POA: Diagnosis not present

## 2016-08-05 DIAGNOSIS — F3173 Bipolar disorder, in partial remission, most recent episode manic: Secondary | ICD-10-CM | POA: Diagnosis not present

## 2016-08-05 DIAGNOSIS — Z1389 Encounter for screening for other disorder: Secondary | ICD-10-CM | POA: Diagnosis not present

## 2016-08-05 DIAGNOSIS — J449 Chronic obstructive pulmonary disease, unspecified: Secondary | ICD-10-CM | POA: Diagnosis not present

## 2016-08-05 DIAGNOSIS — Z0000 Encounter for general adult medical examination without abnormal findings: Secondary | ICD-10-CM | POA: Diagnosis not present

## 2016-08-05 DIAGNOSIS — C649 Malignant neoplasm of unspecified kidney, except renal pelvis: Secondary | ICD-10-CM | POA: Diagnosis not present

## 2016-08-05 DIAGNOSIS — R808 Other proteinuria: Secondary | ICD-10-CM | POA: Diagnosis not present

## 2016-08-05 DIAGNOSIS — H9193 Unspecified hearing loss, bilateral: Secondary | ICD-10-CM | POA: Diagnosis not present

## 2016-08-05 DIAGNOSIS — E1129 Type 2 diabetes mellitus with other diabetic kidney complication: Secondary | ICD-10-CM | POA: Diagnosis not present

## 2017-01-04 DIAGNOSIS — H524 Presbyopia: Secondary | ICD-10-CM | POA: Diagnosis not present

## 2017-01-04 DIAGNOSIS — E119 Type 2 diabetes mellitus without complications: Secondary | ICD-10-CM | POA: Diagnosis not present

## 2017-01-04 DIAGNOSIS — H25043 Posterior subcapsular polar age-related cataract, bilateral: Secondary | ICD-10-CM | POA: Diagnosis not present

## 2017-01-04 DIAGNOSIS — H2513 Age-related nuclear cataract, bilateral: Secondary | ICD-10-CM | POA: Diagnosis not present

## 2017-02-02 DIAGNOSIS — R946 Abnormal results of thyroid function studies: Secondary | ICD-10-CM | POA: Diagnosis not present

## 2017-02-02 DIAGNOSIS — E1129 Type 2 diabetes mellitus with other diabetic kidney complication: Secondary | ICD-10-CM | POA: Diagnosis not present

## 2017-02-02 DIAGNOSIS — I10 Essential (primary) hypertension: Secondary | ICD-10-CM | POA: Diagnosis not present

## 2017-02-02 DIAGNOSIS — Z6826 Body mass index (BMI) 26.0-26.9, adult: Secondary | ICD-10-CM | POA: Diagnosis not present

## 2017-02-02 DIAGNOSIS — N184 Chronic kidney disease, stage 4 (severe): Secondary | ICD-10-CM | POA: Diagnosis not present

## 2017-02-02 DIAGNOSIS — F3173 Bipolar disorder, in partial remission, most recent episode manic: Secondary | ICD-10-CM | POA: Diagnosis not present

## 2017-04-04 DIAGNOSIS — H25043 Posterior subcapsular polar age-related cataract, bilateral: Secondary | ICD-10-CM | POA: Diagnosis not present

## 2017-04-04 DIAGNOSIS — H40013 Open angle with borderline findings, low risk, bilateral: Secondary | ICD-10-CM | POA: Diagnosis not present

## 2017-04-04 DIAGNOSIS — E119 Type 2 diabetes mellitus without complications: Secondary | ICD-10-CM | POA: Diagnosis not present

## 2017-04-04 DIAGNOSIS — H2513 Age-related nuclear cataract, bilateral: Secondary | ICD-10-CM | POA: Diagnosis not present

## 2017-04-05 DIAGNOSIS — Z23 Encounter for immunization: Secondary | ICD-10-CM | POA: Diagnosis not present

## 2017-04-18 DIAGNOSIS — H2513 Age-related nuclear cataract, bilateral: Secondary | ICD-10-CM | POA: Diagnosis not present

## 2017-04-18 DIAGNOSIS — H40013 Open angle with borderline findings, low risk, bilateral: Secondary | ICD-10-CM | POA: Diagnosis not present

## 2017-04-20 DIAGNOSIS — K648 Other hemorrhoids: Secondary | ICD-10-CM | POA: Diagnosis not present

## 2017-04-20 DIAGNOSIS — Z6826 Body mass index (BMI) 26.0-26.9, adult: Secondary | ICD-10-CM | POA: Diagnosis not present

## 2017-04-20 DIAGNOSIS — K921 Melena: Secondary | ICD-10-CM | POA: Diagnosis not present

## 2017-05-02 DIAGNOSIS — L821 Other seborrheic keratosis: Secondary | ICD-10-CM | POA: Diagnosis not present

## 2017-05-02 DIAGNOSIS — L570 Actinic keratosis: Secondary | ICD-10-CM | POA: Diagnosis not present

## 2017-05-25 DIAGNOSIS — H25042 Posterior subcapsular polar age-related cataract, left eye: Secondary | ICD-10-CM | POA: Diagnosis not present

## 2017-05-25 DIAGNOSIS — H2512 Age-related nuclear cataract, left eye: Secondary | ICD-10-CM | POA: Diagnosis not present

## 2017-05-25 DIAGNOSIS — H25812 Combined forms of age-related cataract, left eye: Secondary | ICD-10-CM | POA: Diagnosis not present

## 2017-05-25 DIAGNOSIS — H25012 Cortical age-related cataract, left eye: Secondary | ICD-10-CM | POA: Diagnosis not present

## 2017-06-29 DIAGNOSIS — H25811 Combined forms of age-related cataract, right eye: Secondary | ICD-10-CM | POA: Diagnosis not present

## 2017-06-29 DIAGNOSIS — H2511 Age-related nuclear cataract, right eye: Secondary | ICD-10-CM | POA: Diagnosis not present

## 2017-06-29 DIAGNOSIS — H25041 Posterior subcapsular polar age-related cataract, right eye: Secondary | ICD-10-CM | POA: Diagnosis not present

## 2017-06-29 DIAGNOSIS — H25011 Cortical age-related cataract, right eye: Secondary | ICD-10-CM | POA: Diagnosis not present

## 2017-08-02 DIAGNOSIS — R82998 Other abnormal findings in urine: Secondary | ICD-10-CM | POA: Diagnosis not present

## 2017-08-02 DIAGNOSIS — E1129 Type 2 diabetes mellitus with other diabetic kidney complication: Secondary | ICD-10-CM | POA: Diagnosis not present

## 2017-08-02 DIAGNOSIS — E7849 Other hyperlipidemia: Secondary | ICD-10-CM | POA: Diagnosis not present

## 2017-08-02 DIAGNOSIS — N184 Chronic kidney disease, stage 4 (severe): Secondary | ICD-10-CM | POA: Diagnosis not present

## 2017-08-02 DIAGNOSIS — Z125 Encounter for screening for malignant neoplasm of prostate: Secondary | ICD-10-CM | POA: Diagnosis not present

## 2017-08-09 DIAGNOSIS — E7849 Other hyperlipidemia: Secondary | ICD-10-CM | POA: Diagnosis not present

## 2017-08-09 DIAGNOSIS — I10 Essential (primary) hypertension: Secondary | ICD-10-CM | POA: Diagnosis not present

## 2017-08-09 DIAGNOSIS — H9193 Unspecified hearing loss, bilateral: Secondary | ICD-10-CM | POA: Diagnosis not present

## 2017-08-09 DIAGNOSIS — E1129 Type 2 diabetes mellitus with other diabetic kidney complication: Secondary | ICD-10-CM | POA: Diagnosis not present

## 2017-08-09 DIAGNOSIS — N184 Chronic kidney disease, stage 4 (severe): Secondary | ICD-10-CM | POA: Diagnosis not present

## 2017-08-09 DIAGNOSIS — J449 Chronic obstructive pulmonary disease, unspecified: Secondary | ICD-10-CM | POA: Diagnosis not present

## 2017-08-09 DIAGNOSIS — C649 Malignant neoplasm of unspecified kidney, except renal pelvis: Secondary | ICD-10-CM | POA: Diagnosis not present

## 2017-08-09 DIAGNOSIS — Z6826 Body mass index (BMI) 26.0-26.9, adult: Secondary | ICD-10-CM | POA: Diagnosis not present

## 2017-08-09 DIAGNOSIS — Z1389 Encounter for screening for other disorder: Secondary | ICD-10-CM | POA: Diagnosis not present

## 2017-08-09 DIAGNOSIS — R05 Cough: Secondary | ICD-10-CM | POA: Diagnosis not present

## 2017-08-09 DIAGNOSIS — Z0000 Encounter for general adult medical examination without abnormal findings: Secondary | ICD-10-CM | POA: Diagnosis not present

## 2017-08-15 DIAGNOSIS — Z1212 Encounter for screening for malignant neoplasm of rectum: Secondary | ICD-10-CM | POA: Diagnosis not present

## 2017-10-18 DIAGNOSIS — H401123 Primary open-angle glaucoma, left eye, severe stage: Secondary | ICD-10-CM | POA: Diagnosis not present

## 2017-10-18 DIAGNOSIS — H401112 Primary open-angle glaucoma, right eye, moderate stage: Secondary | ICD-10-CM | POA: Diagnosis not present

## 2018-02-16 DIAGNOSIS — J449 Chronic obstructive pulmonary disease, unspecified: Secondary | ICD-10-CM | POA: Diagnosis not present

## 2018-02-16 DIAGNOSIS — C649 Malignant neoplasm of unspecified kidney, except renal pelvis: Secondary | ICD-10-CM | POA: Diagnosis not present

## 2018-02-16 DIAGNOSIS — Z6827 Body mass index (BMI) 27.0-27.9, adult: Secondary | ICD-10-CM | POA: Diagnosis not present

## 2018-02-16 DIAGNOSIS — I10 Essential (primary) hypertension: Secondary | ICD-10-CM | POA: Diagnosis not present

## 2018-02-16 DIAGNOSIS — F3173 Bipolar disorder, in partial remission, most recent episode manic: Secondary | ICD-10-CM | POA: Diagnosis not present

## 2018-02-16 DIAGNOSIS — E1129 Type 2 diabetes mellitus with other diabetic kidney complication: Secondary | ICD-10-CM | POA: Diagnosis not present

## 2018-02-19 DIAGNOSIS — H401123 Primary open-angle glaucoma, left eye, severe stage: Secondary | ICD-10-CM | POA: Diagnosis not present

## 2018-02-19 DIAGNOSIS — H401112 Primary open-angle glaucoma, right eye, moderate stage: Secondary | ICD-10-CM | POA: Diagnosis not present

## 2018-02-19 DIAGNOSIS — E119 Type 2 diabetes mellitus without complications: Secondary | ICD-10-CM | POA: Diagnosis not present

## 2018-02-19 DIAGNOSIS — H43813 Vitreous degeneration, bilateral: Secondary | ICD-10-CM | POA: Diagnosis not present

## 2018-05-01 DIAGNOSIS — L821 Other seborrheic keratosis: Secondary | ICD-10-CM | POA: Diagnosis not present

## 2018-05-01 DIAGNOSIS — L570 Actinic keratosis: Secondary | ICD-10-CM | POA: Diagnosis not present

## 2018-05-31 DIAGNOSIS — Z23 Encounter for immunization: Secondary | ICD-10-CM | POA: Diagnosis not present

## 2018-06-22 DIAGNOSIS — N184 Chronic kidney disease, stage 4 (severe): Secondary | ICD-10-CM | POA: Diagnosis not present

## 2018-06-22 DIAGNOSIS — R042 Hemoptysis: Secondary | ICD-10-CM | POA: Diagnosis not present

## 2018-06-22 DIAGNOSIS — E1129 Type 2 diabetes mellitus with other diabetic kidney complication: Secondary | ICD-10-CM | POA: Diagnosis not present

## 2018-06-22 DIAGNOSIS — R05 Cough: Secondary | ICD-10-CM | POA: Diagnosis not present

## 2018-06-22 DIAGNOSIS — R918 Other nonspecific abnormal finding of lung field: Secondary | ICD-10-CM | POA: Diagnosis not present

## 2018-06-22 DIAGNOSIS — I10 Essential (primary) hypertension: Secondary | ICD-10-CM | POA: Diagnosis not present

## 2018-06-22 DIAGNOSIS — Z6827 Body mass index (BMI) 27.0-27.9, adult: Secondary | ICD-10-CM | POA: Diagnosis not present

## 2018-06-25 ENCOUNTER — Other Ambulatory Visit: Payer: Self-pay | Admitting: Internal Medicine

## 2018-06-25 DIAGNOSIS — R9389 Abnormal findings on diagnostic imaging of other specified body structures: Secondary | ICD-10-CM

## 2018-06-26 ENCOUNTER — Ambulatory Visit
Admission: RE | Admit: 2018-06-26 | Discharge: 2018-06-26 | Disposition: A | Discharge status: Home / Self Care | Payer: Medicare Other | Source: Ambulatory Visit | Attending: Internal Medicine | Admitting: Internal Medicine

## 2018-06-26 DIAGNOSIS — H401112 Primary open-angle glaucoma, right eye, moderate stage: Secondary | ICD-10-CM | POA: Diagnosis not present

## 2018-06-26 DIAGNOSIS — H401123 Primary open-angle glaucoma, left eye, severe stage: Secondary | ICD-10-CM | POA: Diagnosis not present

## 2018-06-26 DIAGNOSIS — R9389 Abnormal findings on diagnostic imaging of other specified body structures: Secondary | ICD-10-CM

## 2018-06-26 DIAGNOSIS — R918 Other nonspecific abnormal finding of lung field: Secondary | ICD-10-CM | POA: Diagnosis not present

## 2018-07-02 ENCOUNTER — Other Ambulatory Visit (HOSPITAL_COMMUNITY): Payer: Self-pay | Admitting: Internal Medicine

## 2018-07-02 DIAGNOSIS — C7800 Secondary malignant neoplasm of unspecified lung: Secondary | ICD-10-CM

## 2018-07-02 DIAGNOSIS — C7970 Secondary malignant neoplasm of unspecified adrenal gland: Secondary | ICD-10-CM

## 2018-07-05 ENCOUNTER — Telehealth: Payer: Self-pay | Admitting: *Deleted

## 2018-07-05 DIAGNOSIS — R918 Other nonspecific abnormal finding of lung field: Secondary | ICD-10-CM

## 2018-07-05 NOTE — Telephone Encounter (Signed)
Oncology Nurse Navigator Documentation  Oncology Nurse Navigator Flowsheets 07/05/2018  Navigator Location [facility]  Referral date to RadOnc/MedOnc 07/04/2018  Navigator Encounter Type Telephone/I received referral on Blake Hines.  I called him to schedule at [facility] next week. He verbalized understanding of appt time and place.   Telephone Outgoing Call  Treatment Phase Abnormal Scans  Barriers/Navigation Needs Education;Coordination of Care  Education Other  Interventions Coordination of Care;Education  Coordination of Care Appts  Acuity Level 1  Time Spent with Patient 15

## 2018-07-06 ENCOUNTER — Other Ambulatory Visit: Payer: Self-pay | Admitting: Student

## 2018-07-09 ENCOUNTER — Other Ambulatory Visit: Payer: Self-pay

## 2018-07-09 ENCOUNTER — Ambulatory Visit (HOSPITAL_COMMUNITY)
Admission: RE | Admit: 2018-07-09 | Discharge: 2018-07-09 | Disposition: A | Discharge status: Home / Self Care | Payer: Medicare Other | Source: Ambulatory Visit | Attending: Internal Medicine | Admitting: Internal Medicine

## 2018-07-09 ENCOUNTER — Encounter (HOSPITAL_COMMUNITY): Payer: Self-pay

## 2018-07-09 DIAGNOSIS — C7800 Secondary malignant neoplasm of unspecified lung: Secondary | ICD-10-CM | POA: Insufficient documentation

## 2018-07-09 DIAGNOSIS — C7970 Secondary malignant neoplasm of unspecified adrenal gland: Secondary | ICD-10-CM | POA: Insufficient documentation

## 2018-07-09 DIAGNOSIS — E279 Disorder of adrenal gland, unspecified: Secondary | ICD-10-CM | POA: Diagnosis not present

## 2018-07-09 DIAGNOSIS — C801 Malignant (primary) neoplasm, unspecified: Secondary | ICD-10-CM | POA: Diagnosis not present

## 2018-07-09 DIAGNOSIS — C7972 Secondary malignant neoplasm of left adrenal gland: Secondary | ICD-10-CM | POA: Diagnosis not present

## 2018-07-09 LAB — GLUCOSE, CAPILLARY
Glucose-Capillary: 132 mg/dL — ABNORMAL HIGH (ref 70–99)
Glucose-Capillary: 146 mg/dL — ABNORMAL HIGH (ref 70–99)

## 2018-07-09 LAB — CBC
HCT: 49.4 % (ref 39.0–52.0)
Hemoglobin: 16.6 g/dL (ref 13.0–17.0)
MCH: 28.6 pg (ref 26.0–34.0)
MCHC: 33.6 g/dL (ref 30.0–36.0)
MCV: 85 fL (ref 80.0–100.0)
NRBC: 0 % (ref 0.0–0.2)
PLATELETS: 246 10*3/uL (ref 150–400)
RBC: 5.81 MIL/uL (ref 4.22–5.81)
RDW: 12.5 % (ref 11.5–15.5)
WBC: 8.5 10*3/uL (ref 4.0–10.5)

## 2018-07-09 LAB — PROTIME-INR
INR: 0.96
Prothrombin Time: 12.7 seconds (ref 11.4–15.2)

## 2018-07-09 MED ORDER — HYDROCODONE-ACETAMINOPHEN 5-325 MG PO TABS
1.0000 | ORAL_TABLET | ORAL | Status: DC | PRN
  Filled 2018-07-09: qty 2

## 2018-07-09 MED ORDER — MIDAZOLAM HCL 2 MG/2ML IJ SOLN
INTRAMUSCULAR | Status: AC
  Filled 2018-07-09: qty 2

## 2018-07-09 MED ORDER — MIDAZOLAM HCL 2 MG/2ML IJ SOLN
INTRAMUSCULAR | Status: AC | PRN
  Administered 2018-07-09: 1 mg via INTRAVENOUS
  Administered 2018-07-09: 0.5 mg via INTRAVENOUS

## 2018-07-09 MED ORDER — FENTANYL CITRATE (PF) 100 MCG/2ML IJ SOLN
INTRAMUSCULAR | Status: AC
  Filled 2018-07-09: qty 2

## 2018-07-09 MED ORDER — FENTANYL CITRATE (PF) 100 MCG/2ML IJ SOLN
INTRAMUSCULAR | Status: AC | PRN
  Administered 2018-07-09: 50 ug via INTRAVENOUS
  Administered 2018-07-09: 25 ug via INTRAVENOUS

## 2018-07-09 MED ORDER — SODIUM CHLORIDE 0.9 % IV SOLN
INTRAVENOUS | Status: AC | PRN
  Administered 2018-07-09: 10 mL/h via INTRAVENOUS

## 2018-07-09 MED ORDER — SODIUM CHLORIDE 0.9 % IV SOLN: INTRAVENOUS | Status: DC

## 2018-07-09 NOTE — Procedures (Signed)
Interventional Radiology Procedure:   Indications: Metastatic disease and needs tissue diagnosis.  Left adrenal mass  Procedure: CT guided left adrenal mass biopsy  Findings: Scant core material collected.  Aspirated 4 ml of blood and send for cytology  Complications: None     EBL: Minimal  Plan: Bedrest 3 hours.   Wetona Viramontes R. Anselm Pancoast, MD  Pager: [phone]

## 2018-07-09 NOTE — Sedation Documentation (Signed)
Patient is resting comfortably. 

## 2018-07-09 NOTE — Discharge Instructions (Addendum)

## 2018-07-09 NOTE — H&P (Signed)
Chief Complaint: Adrenal mass  Referring Physician(s): Perini,Mark  Supervising Physician: Markus Daft  Patient Status: [facility] - Out-pt  History of Present Illness: Blake Hines is a 84 y.o. male with history of left nephrectomy for cancer.  He apparently developed some hemoptysis and was seen by his PCP.  Xray was obtained which "abnormal" per chart so CT scan was done on 06/27/2018.  CT showed a left upper quadrant mass likely arising from the left adrenal gland.   This conceivably could be recurrent tumor in the nephrectomy bed.  He is a poor historian and really couldn't tell me why he was here today.   Once I explained things to him he said "oh yeah that's right". He didn't know the mass was on the left, but he did confirm he had the left kidney removed in the past.  Other medical issues include diabetes and hypertension.  He is NPO. He does not take blood thinners. He feels well today. No fever/chills, no cough/cold, no N/V   Allergies: Patient has no known allergies.  Medications: Prior to Admission medications   Medication Sig Start Date End Date Taking? Authorizing Provider  cholecalciferol (VITAMIN D3) 25 MCG (1000 UT) tablet Take 1,000 Units by mouth daily.   Yes [provider]  Coenzyme Q10 (COQ10) 200 MG CAPS Take 1 capsule by mouth daily.   Yes [provider]  fenofibrate (TRICOR) 48 MG tablet Take 48 mg by mouth daily.   Yes [provider]  hydrochlorothiazide (HYDRODIURIL) 25 MG tablet Take 25 mg by mouth daily.   Yes [provider]  Insulin Glargine (TOUJEO SOLOSTAR [place]) Inject 3 Units into the skin daily.   Yes [provider]  insulin lispro (HUMALOG KWIKPEN) 100 UNIT/ML KwikPen Inject 3 Units into the skin every evening. With dinner   Yes [provider]  latanoprost (XALATAN) 0.005 % ophthalmic solution Place 1 drop into both eyes at bedtime.   Yes [provider]  losartan (COZAAR)  100 MG tablet Take 100 mg by mouth daily.   Yes [provider]  Multiple Vitamins-Minerals (MULTIVITAMIN WITH MINERALS) tablet Take 1 tablet by mouth daily.   Yes [provider]  OLANZapine (ZYPREXA) 2.5 MG tablet Take 2.5 mg by mouth at bedtime.   Yes [provider]  sertraline (ZOLOFT) 50 MG tablet Take 50 mg by mouth daily.   Yes [provider]  sitaGLIPtin (JANUVIA) 50 MG tablet Take 50 mg by mouth every morning.   Yes [provider]     History reviewed. No pertinent family history.  Social History   Socioeconomic History  . Marital status: Widowed    Spouse name: Not on file  . Number of children: Not on file  . Years of education: Not on file  . Highest education level: Not on file  Occupational History  . Not on file  Social Needs  . Financial resource strain: Not on file  . Food insecurity:    Worry: Not on file    Inability: Not on file  . Transportation needs:    Medical: Not on file    Non-medical: Not on file  Tobacco Use  . Smoking status: Not on file  Substance and Sexual Activity  . Alcohol use: Not on file  . Drug use: Not on file  . Sexual activity: Not on file  Lifestyle  . Physical activity:    Days per week: Not on file    Minutes per  session: Not on file  . Stress: Not on file  Relationships  . Social connections:    Talks on phone: Not on file    Gets together: Not on file    Attends religious service: Not on file    Active member of club or organization: Not on file    Attends meetings of clubs or organizations: Not on file    Relationship status: Not on file  Other Topics Concern  . Not on file  Social History Narrative  . Not on file     Review of Systems: A 12 point ROS discussed and pertinent positives are indicated in the HPI above.  All other systems are negative.  Review of Systems  Vital Signs: BP (!) 199/96 (BP Location: Right Arm)   Pulse 97   Temp (!) 97.4 F (36.3 C)  (Oral)   Ht 5\' 8"  (1.727 m)   Wt 79.4 kg   SpO2 96%   BMI 26.61 kg/m   Physical Exam  Constitutional: He is oriented to person, place, and time. He appears well-developed.  HENT:  Head: Normocephalic and atraumatic.  Eyes: EOM are normal.  Neck: Normal range of motion.  Cardiovascular: Normal rate, regular rhythm and normal heart sounds.  Pulmonary/Chest: Effort normal and breath sounds normal. No respiratory distress.  Abdominal: Soft. He exhibits no distension. There is no tenderness.  Musculoskeletal: Normal range of motion.  Neurological: He is alert and oriented to person, place, and time.  Skin: Skin is warm and dry.  Psychiatric: He has a normal mood and affect. His behavior is normal. Judgment and thought content normal.  Vitals reviewed.   Imaging: Ct Chest Wo Contrast  Addendum Date: 06/27/2018   ADDENDUM REPORT: 06/27/2018 08:53 ADDENDUM: Mediastinum: Also noted is fullness in the left infrahilar region, likely left infrahilar adenopathy measuring up to 2.4 cm. Electronically Signed   By: Rolm Baptise M.D.   On: 06/27/2018 08:53   Result Date: 06/27/2018 CLINICAL DATA:  Hemoptysis. History of left renal cell carcinoma and nephrectomy. Abnormal chest x-ray in office EXAM: CT CHEST WITHOUT CONTRAST TECHNIQUE: Multidetector CT imaging of the chest was performed following the standard protocol without IV contrast. COMPARISON:  Chest x-ray 04/16/2012.  Chest CT 04/20/2011 FINDINGS: Cardiovascular: Aortic atherosclerotic calcifications. Heart is normal size. Aorta is normal caliber. Mediastinum/Nodes: Large subcarinal nodal mass measures 5.6 x 4.4 cm. There are calcifications in the subcarinal region, likely separate calcified subcarinal lymph nodes. Small scattered mediastinal lymph nodes elsewhere, including 9 mm AP window lymph node and similarly sized pretracheal/paratracheal lymph nodes. Lungs/Pleura: There are numerous calcified pulmonary nodules compatible with old  granulomas. Several noncalcified nodules are noted, the largest 2.3 x 1.9 cm in the medial right lower lobe on image 83. Peripheral right lower lobe nodule on image 93 measures 11 mm. Right lower lobe nodule on image 113 measures 7 mm. There is airway thickening and obstruction in the right upper lobe with adjacent tubular nodule on image 50 measuring 1.4 x 2.0 cm. Nodule is noted along the surface of the left lower lobe and the posterior mediastinum posterior to the heart on image 127 measuring 1.8 cm. It is difficult to determine if this is within the lung or possible pleural nodule. Nodularity noted along both diaphragms. There is nodular opacities and interstitial thickening in both upper lobes, right greater than left most compatible with scarring, present on prior study of 2012 left progressed. No effusions. Upper Abdomen: Prior left nephrectomy. There is a soft tissue mass  which appears to be extending from the left adrenal gland which measures up to 5.4 cm. This is new since 2012. Small nodule in the right adrenal gland measures 1.3 cm, new since 2012. Calcifications in the spleen. Musculoskeletal: Chest wall soft tissues are unremarkable. Sclerotic focus in a midthoracic vertebral body was present in 2012, likely bone island. Lucent areas noted along the inferior endplates at P92 and L1 is likely degenerative related. IMPRESSION: There are numerous bilateral pulmonary nodules which are suspicious for metastatic disease. Cannot exclude a primary lung cancer in the right lower lobe medially. Associated bulky subcarinal adenopathy and scattered borderline sized mediastinal lymph nodes elsewhere. Probable left infrahilar adenopathy, difficult to visualize well on this noncontrast study. Obstruction of the right upper lobe airways which could be related to endobronchial lesion. There is adjacent nodularity in the right upper lobe. Left upper quadrant mass likely arising from the left adrenal gland. This  conceivably could be recurrent tumor in the nephrectomy bed. Old granulomatous disease. Aortic atherosclerosis. These results were called by telephone at the time of interpretation on 06/27/2018 at 8:27 am to Dr. Crist Infante , who verbally acknowledged these results. Electronically Signed: By: Rolm Baptise M.D. On: 06/27/2018 08:27    Labs:  CBC: No results for input(s): WBC, HGB, HCT, PLT in the last 8760 hours.  COAGS: No results for input(s): INR, APTT in the last 8760 hours.  BMP: No results for input(s): NA, K, CL, CO2, GLUCOSE, BUN, CALCIUM, CREATININE, GFRNONAA, GFRAA in the last 8760 hours.  Invalid input(s): CMP  LIVER FUNCTION TESTS: No results for input(s): BILITOT, AST, ALT, ALKPHOS, PROT, ALBUMIN in the last 8760 hours.  TUMOR MARKERS: No results for input(s): AFPTM, CEA, CA199, CHROMGRNA in the last 8760 hours.  Assessment and Plan:  Left adrenal mass in the setting of previous renal cancer s/p nephrectomy.  Will proceed with image guided biopsy today by Dr. Anselm Pancoast.  Risks and benefits discussed with the patient including, but not limited to bleeding, infection, damage to adjacent structures or low yield requiring additional tests.  All of the patient's questions were answered, patient is agreeable to proceed. Consent signed and in chart.  Thank you for this interesting consult.  I greatly enjoyed meeting Blake Hines and look forward to participating in their care.  A copy of this report was sent to the requesting provider on this date.  Electronically Signed: Murrell Redden, PA-C   07/09/2018, 7:49 AM      I spent a total of  30 Minutes in face to face in clinical consultation, greater than 50% of which was counseling/coordinating care for left adrenal biopsy.

## 2018-07-11 ENCOUNTER — Encounter: Payer: Self-pay | Admitting: *Deleted

## 2018-07-11 DIAGNOSIS — R918 Other nonspecific abnormal finding of lung field: Secondary | ICD-10-CM

## 2018-07-12 ENCOUNTER — Inpatient Hospital Stay: Payer: Medicare Other

## 2018-07-12 ENCOUNTER — Telehealth: Payer: Self-pay

## 2018-07-12 ENCOUNTER — Other Ambulatory Visit: Payer: Self-pay | Admitting: *Deleted

## 2018-07-12 ENCOUNTER — Inpatient Hospital Stay: Payer: Medicare Other | Attending: Internal Medicine | Admitting: Internal Medicine

## 2018-07-12 ENCOUNTER — Encounter: Payer: Self-pay | Admitting: Internal Medicine

## 2018-07-12 ENCOUNTER — Telehealth: Payer: Self-pay | Admitting: *Deleted

## 2018-07-12 ENCOUNTER — Ambulatory Visit: Payer: Medicare Other | Admitting: Radiation Oncology

## 2018-07-12 VITALS — BP 147/93 | HR 88 | Temp 98.0°F | Resp 16 | Ht 68.0 in | Wt 180.6 lb

## 2018-07-12 DIAGNOSIS — C649 Malignant neoplasm of unspecified kidney, except renal pelvis: Secondary | ICD-10-CM | POA: Insufficient documentation

## 2018-07-12 DIAGNOSIS — Z5112 Encounter for antineoplastic immunotherapy: Secondary | ICD-10-CM

## 2018-07-12 DIAGNOSIS — R918 Other nonspecific abnormal finding of lung field: Secondary | ICD-10-CM

## 2018-07-12 DIAGNOSIS — C7972 Secondary malignant neoplasm of left adrenal gland: Secondary | ICD-10-CM | POA: Insufficient documentation

## 2018-07-12 DIAGNOSIS — F17210 Nicotine dependence, cigarettes, uncomplicated: Secondary | ICD-10-CM

## 2018-07-12 DIAGNOSIS — Z79899 Other long term (current) drug therapy: Secondary | ICD-10-CM | POA: Diagnosis not present

## 2018-07-12 DIAGNOSIS — R5383 Other fatigue: Secondary | ICD-10-CM | POA: Diagnosis not present

## 2018-07-12 DIAGNOSIS — E1122 Type 2 diabetes mellitus with diabetic chronic kidney disease: Secondary | ICD-10-CM

## 2018-07-12 DIAGNOSIS — I129 Hypertensive chronic kidney disease with stage 1 through stage 4 chronic kidney disease, or unspecified chronic kidney disease: Secondary | ICD-10-CM | POA: Insufficient documentation

## 2018-07-12 DIAGNOSIS — Z794 Long term (current) use of insulin: Secondary | ICD-10-CM

## 2018-07-12 DIAGNOSIS — N189 Chronic kidney disease, unspecified: Secondary | ICD-10-CM | POA: Insufficient documentation

## 2018-07-12 DIAGNOSIS — Z87891 Personal history of nicotine dependence: Secondary | ICD-10-CM | POA: Insufficient documentation

## 2018-07-12 DIAGNOSIS — R5382 Chronic fatigue, unspecified: Secondary | ICD-10-CM

## 2018-07-12 DIAGNOSIS — C7802 Secondary malignant neoplasm of left lung: Secondary | ICD-10-CM | POA: Insufficient documentation

## 2018-07-12 DIAGNOSIS — F1020 Alcohol dependence, uncomplicated: Secondary | ICD-10-CM | POA: Diagnosis not present

## 2018-07-12 DIAGNOSIS — C7801 Secondary malignant neoplasm of right lung: Secondary | ICD-10-CM

## 2018-07-12 DIAGNOSIS — Z7189 Other specified counseling: Secondary | ICD-10-CM

## 2018-07-12 DIAGNOSIS — Z85528 Personal history of other malignant neoplasm of kidney: Secondary | ICD-10-CM | POA: Diagnosis not present

## 2018-07-12 LAB — CBC WITH DIFFERENTIAL (CANCER CENTER ONLY)
Abs Immature Granulocytes: 0.03 10*3/uL (ref 0.00–0.07)
Basophils Absolute: 0 10*3/uL (ref 0.0–0.1)
Basophils Relative: 1 %
Eosinophils Absolute: 0.3 10*3/uL (ref 0.0–0.5)
Eosinophils Relative: 4 %
HCT: 47 % (ref 39.0–52.0)
Hemoglobin: 16.1 g/dL (ref 13.0–17.0)
Immature Granulocytes: 0 %
Lymphocytes Relative: 21 %
Lymphs Abs: 1.5 10*3/uL (ref 0.7–4.0)
MCH: 29.2 pg (ref 26.0–34.0)
MCHC: 34.3 g/dL (ref 30.0–36.0)
MCV: 85.3 fL (ref 80.0–100.0)
Monocytes Absolute: 0.6 10*3/uL (ref 0.1–1.0)
Monocytes Relative: 8 %
Neutro Abs: 4.5 10*3/uL (ref 1.7–7.7)
Neutrophils Relative %: 66 %
Platelet Count: 224 10*3/uL (ref 150–400)
RBC: 5.51 MIL/uL (ref 4.22–5.81)
RDW: 12.6 % (ref 11.5–15.5)
WBC: 6.9 10*3/uL (ref 4.0–10.5)
nRBC: 0 % (ref 0.0–0.2)

## 2018-07-12 LAB — CMP (CANCER CENTER ONLY)
ALT: 22 U/L (ref 0–44)
ANION GAP: 9 (ref 5–15)
AST: 21 U/L (ref 15–41)
Albumin: 4.3 g/dL (ref 3.5–5.0)
Alkaline Phosphatase: 72 U/L (ref 38–126)
BUN: 31 mg/dL — ABNORMAL HIGH (ref 8–23)
CO2: 29 mmol/L (ref 22–32)
Calcium: 10.5 mg/dL — ABNORMAL HIGH (ref 8.9–10.3)
Chloride: 95 mmol/L — ABNORMAL LOW (ref 98–111)
Creatinine: 1.89 mg/dL — ABNORMAL HIGH (ref 0.61–1.24)
GFR, EST NON AFRICAN AMERICAN: 32 mL/min — AB (ref >60)
GFR, Est AFR Am: 37 mL/min — ABNORMAL LOW (ref >60)
Glucose, Bld: 176 mg/dL — ABNORMAL HIGH (ref 70–99)
Potassium: 4.5 mmol/L (ref 3.5–5.1)
Sodium: 133 mmol/L — ABNORMAL LOW (ref 135–145)
Total Bilirubin: 0.6 mg/dL (ref 0.3–1.2)
Total Protein: 7.8 g/dL (ref 6.5–8.1)

## 2018-07-12 NOTE — Patient Instructions (Signed)
Kidney Cancer Kidney cancer is an abnormal growth of cells in the kidney that is cancerous (malignant). Unlike noncancerous (benign) tumors, malignant tumors can spread to other parts of your body. The kidneys are the organs that filter your blood and keep it clean. They move waste out of your blood and into your urine. Urine passes from the kidneys, through the ureters, and into the bladder. When you urinate, these wastes leave the body. What increases the risk? There are a number of risk factors that can increase your chances of getting kidney cancer. They include:  Age. The likelihood of developing kidney cancer increases with age.  Family history of kidney cancer.  Being [place] American, Native [place], or Native [place].  Smoking.  Being male.  Obesity.  High blood pressure (hypertension).  Advanced kidney disease, especially requiring long-term kidney dialysis.  Inherited conditions, such as von Hippel-Lindau disease, tuberous sclerosis, and familial papillary renal cell carcinoma.  Exposure to certain chemicals in the workplace.  What are the signs or symptoms? Early kidney cancer often does not cause symptoms. As the cancer grows, symptoms may include:  Blood in the urine.  Abdominal pain or back pain.  Fatigue.  Unexplained weight loss.  Fever.  How is this diagnosed? Your health care provider may ask about your medical history and perform a physical exam. Other tests that may be done include:  Blood and urine tests.  X-rays.  Imaging tests, such as CT scans, MRI, and PET scans.  Intravenous pyelogram.  Angiogram.  Examination of a tissue sample (biopsy) from your kidney.  If kidney cancer is confirmed, it will be staged to determine its severity and extent. Staging is an assessment of the size of the tumor and if and where the cancer has spread. Cancer stages include:  Stage I. The cancer is found only in the kidney. The tumor may be up to 2 inches  (7 cm) wide.  Stage II. The cancer is found only in the kidney. The tumor is wider than 2 inches (7 cm).  Stage III. The cancer has spread to nearby tissues and possibly to the lymph nodes.  Stage IV. The cancer has spread to the lymph nodes and other parts of the body, such as the bones, brain, liver, or lungs.  How is this treated? Treatment for kidney cancer depends on the type and stage of the cancer. Treatment may include one or more of the following:  Surgery. This may include surgery to remove: ? Just the tumor (nephron-sparing surgery). ? The entire kidney (nephrectomy). ? The kidney, some of the surrounding healthy tissue and nearby lymph nodes, as well as the adrenal gland in certain cases (radical nephrectomy).  Radiation therapy. This uses high-energy rays to kill cancer cells.  Cryoablation. This uses a needle that delivers gas into the cancer cells to freeze them.  Radiofrequency ablation. This uses a needle to deliver high-energy radio waves that heat and burn the cancer cells.  Arterial embolization. This uses a tiny tube that is inserted through a groin artery and threaded up toward the kidney tumor. A substance is injected into the tube to block blood flow to the tumor, which causes it to die.  Drug therapy. Drugs may be used to help your body fight the cancer or to stop cancer cells from growing. This may include chemotherapy, biological therapy, and immunological therapy.  Follow these instructions at home:  Take medicines, supplements, and herbal remedies only as directed by your health care provider.  Maintain  a healthy diet.  Consider joining a support group. This may help you learn to cope with the stress of having kidney cancer.  Seek advice to help you manage side effects of treatment.  Keep all follow-up appointments as directed by your health care provider. This is important. Contact a health care provider if:  You notice that you bruise or bleed  easily.  You notice that you are losing weight without trying.  You have new or increased fatigue or weakness. Get help right away if:  You have blood in your urine.  You have a sudden increase in pain.  You have a fever.  You are short of breath.  You have chest pain.  Your skin or your eyes are yellow. This information is not intended to replace advice given to you by your health care provider. Make sure you discuss any questions you have with your health care provider. Document Released: 05/28/2004 Document Revised: 12/24/2015 Document Reviewed: 12/31/2013 Elsevier Interactive Patient Education  2018 [place] What is this medicine? IPILIMUMAB (IP i LIM ue mab) is a monoclonal antibody. It is used to treat melanoma, a type of skin cancer. This medicine may be used for other purposes; ask your health care provider or pharmacist if you have questions. COMMON BRAND NAME(S): YERVOY What should I tell my health care provider before I take this medicine? They need to know if you have any of these conditions: -Addison's disease -blood in your stools (black or tarry stools) or if you have blood in your vomit -eye disease, vision problems -history of pancreatitis -history of stomach bleeding -immune system problems -inflammatory bowel disease -kidney disease -liver disease -lupus -myasthenia gravis -organ transplant -rheumatoid arthritis -sarcoidosis -stomach or intestine problems -thyroid disease -tingling of the fingers or toes, or other nerve disorder -an unusual or allergic reaction to ipilimumab, other medicines, foods, dyes, or preservatives -pregnant or trying to get pregnant -breast-feeding How should I use this medicine? This medicine is for infusion into a vein. It is given by a health care professional in a hospital or clinic setting. A special MedGuide will be given to you before each treatment. Be sure to read this information  carefully each time. Talk to your pediatrician regarding the use of this medicine in children. While this drug may be prescribed for children as young as 12 years for selected conditions, precautions do apply. Overdosage: If you think you have taken too much of this medicine contact a poison control center or emergency room at once. NOTE: This medicine is only for you. Do not share this medicine with others. What if I miss a dose? It is important not to miss your dose. Call your doctor or health care professional if you are unable to keep an appointment. What may interact with this medicine? Interactions are not expected. This list may not describe all possible interactions. Give your health care provider a list of all the medicines, herbs, non-prescription drugs, or dietary supplements you use. Also tell them if you smoke, drink alcohol, or use illegal drugs. Some items may interact with your medicine. What should I watch for while using this medicine? Tell your doctor or healthcare professional if your symptoms do not start to get better or if they get worse. Do not become pregnant while taking this medicine or for 3 months after stopping it. Women should inform their doctor if they wish to become pregnant or think they might be pregnant. There is a potential for serious side  effects to an unborn child. Talk to your health care professional or pharmacist for more information. Do not breast-feed an infant while taking this medicine or for 3 months after the last dose. Your condition will be monitored carefully while you are receiving this medicine. You may need blood work done while you are taking this medicine. What side effects may I notice from receiving this medicine? Side effects that you should report to your doctor or health care professional as soon as possible: -allergic reactions like skin rash, itching or hives, swelling of the face, lips, or tongue -black, tarry stools -bloody or  watery diarrhea -changes in vision -dizziness -eye pain -fast, irregular heartbeat -feeling anxious -feeling faint or lightheaded, falls -nausea, vomiting -pain, tingling, numbness in the hands or feet -redness, blistering, peeling or loosening of the skin, including inside the mouth -signs and symptoms of liver injury like dark yellow or brown urine; general ill feeling or flu-like symptoms; light-colored stools; loss of appetite; nausea; right upper belly pain; unusually weak or tired; yellowing of the eyes or skin -unusual bleeding or bruising Side effects that usually do not require medical attention (report to your doctor or health care professional if they continue or are bothersome): -headache -loss of appetite -trouble sleeping This list may not describe all possible side effects. Call your doctor for medical advice about side effects. You may report side effects to FDA at 1-800-FDA-1088. Where should I keep my medicine? This drug is given in a hospital or clinic and will not be stored at home. NOTE: This sheet is a summary. It may not cover all possible information. If you have questions about this medicine, talk to your doctor, pharmacist, or health care provider.  2018 Elsevier/Gold Standard (2016-02-23 11:41:46)  Nivolumab injection What is this medicine? NIVOLUMAB (nye VOL ue mab) is a monoclonal antibody. It is used to treat melanoma, lung cancer, kidney cancer, head and neck cancer, Hodgkin lymphoma, urothelial cancer, colon cancer, and liver cancer. This medicine may be used for other purposes; ask your health care provider or pharmacist if you have questions. COMMON BRAND NAME(S): Opdivo What should I tell my health care provider before I take this medicine? They need to know if you have any of these conditions: -diabetes -immune system problems -kidney disease -liver disease -lung disease -organ transplant -stomach or intestine problems -thyroid disease -an  unusual or allergic reaction to nivolumab, other medicines, foods, dyes, or preservatives -pregnant or trying to get pregnant -breast-feeding How should I use this medicine? This medicine is for infusion into a vein. It is given by a health care professional in a hospital or clinic setting. A special MedGuide will be given to you before each treatment. Be sure to read this information carefully each time. Talk to your pediatrician regarding the use of this medicine in children. While this drug may be prescribed for children as young as 12 years for selected conditions, precautions do apply. Overdosage: If you think you have taken too much of this medicine contact a poison control center or emergency room at once. NOTE: This medicine is only for you. Do not share this medicine with others. What if I miss a dose? It is important not to miss your dose. Call your doctor or health care professional if you are unable to keep an appointment. What may interact with this medicine? Interactions have not been studied. Give your health care provider a list of all the medicines, herbs, non-prescription drugs, or dietary supplements you use. Also  tell them if you smoke, drink alcohol, or use illegal drugs. Some items may interact with your medicine. This list may not describe all possible interactions. Give your health care provider a list of all the medicines, herbs, non-prescription drugs, or dietary supplements you use. Also tell them if you smoke, drink alcohol, or use illegal drugs. Some items may interact with your medicine. What should I watch for while using this medicine? This drug may make you feel generally unwell. Continue your course of treatment even though you feel ill unless your doctor tells you to stop. You may need blood work done while you are taking this medicine. Do not become pregnant while taking this medicine or for 5 months after stopping it. Women should inform their doctor if they  wish to become pregnant or think they might be pregnant. There is a potential for serious side effects to an unborn child. Talk to your health care professional or pharmacist for more information. Do not breast-feed an infant while taking this medicine. What side effects may I notice from receiving this medicine? Side effects that you should report to your doctor or health care professional as soon as possible: -allergic reactions like skin rash, itching or hives, swelling of the face, lips, or tongue -black, tarry stools -blood in the urine -bloody or watery diarrhea -changes in vision -change in sex drive -changes in emotions or moods -chest pain -confusion -cough -decreased appetite -diarrhea -facial flushing -feeling faint or lightheaded -fever, chills -hair loss -hallucination, loss of contact with reality -headache -irritable -joint pain -loss of memory -muscle pain -muscle weakness -seizures -shortness of breath -signs and symptoms of high blood sugar such as dizziness; dry mouth; dry skin; fruity breath; nausea; stomach pain; increased hunger or thirst; increased urination -signs and symptoms of kidney injury like trouble passing urine or change in the amount of urine -signs and symptoms of liver injury like dark yellow or brown urine; general ill feeling or flu-like symptoms; light-colored stools; loss of appetite; nausea; right upper belly pain; unusually weak or tired; yellowing of the eyes or skin -stiff neck -swelling of the ankles, feet, hands -weight gain Side effects that usually do not require medical attention (report to your doctor or health care professional if they continue or are bothersome): -bone pain -constipation -tiredness -vomiting This list may not describe all possible side effects. Call your doctor for medical advice about side effects. You may report side effects to FDA at 1-800-FDA-1088. Where should I keep my medicine? This drug is given in a  hospital or clinic and will not be stored at home. NOTE: This sheet is a summary. It may not cover all possible information. If you have questions about this medicine, talk to your doctor, pharmacist, or health care provider.  2018 Elsevier/Gold Standard (2016-04-25 17:49:34)

## 2018-07-12 NOTE — Progress Notes (Signed)
[place] [phone]   [phone]  CONSULT NOTE  REFERRING PHYSICIAN: Dr. Crist Infante  REASON FOR CONSULTATION:  84 years old white male with recurrent renal cell carcinoma  HPI Blake Hines is a 84 y.o. male with past medical history significant for bipolar disorder, benign prostatic hypertrophy, stage Ia renal cell carcinoma diagnosed in 2008 status post left nephrectomy on March 29, 2007 with the final pathology consistent with 3.8 cm clear cell carcinoma, T1a.  The patient also has a history of COPD, depression, diabetes mellitus as well as hypertension.  He also has a long history of smoking but quit 10 years ago.  He was seen by his primary care physician Dr. Haynes Kerns complaining of cough and blood in mid October 2019.  Chest x-ray was performed in his office and showed some abnormality in the lung.  This was followed by CT scan of the chest without contrast on June 26, 2018 and it showed numerous calcified pulmonary nodules compatible with old granulomas.  There was also several noncalcified nodules noted the largest measured 2.3 x 1.9 cm in the medial right lower lobe, peripheral right lower lobe nodule measuring 1.1 cm, right lower lobe nodule measuring 0.7 cm.  There was also a nodule noted along the service of the left lower lobe and posterior mediastinum posterior to the heart measuring 1.8 cm.  The scan also showed large subcarinal nodal mass measuring 5.6 x 4.4 cm in addition to small scattered mediastinal lymph nodes elsewhere including 0.9 cm AP window lymph node and similarly sized pretracheal/paratracheal lymph nodes.  The upper abdominal portion of the scan showed a soft tissue mass which appears to be extending from the left adrenal gland and measuring 5.4 cm.  This is new since 2012.  There was also a small nodule in the right adrenal gland measuring 1.3 cm that is also new from 2012. On July 09, 2018 the patient underwent CT-guided  core biopsy of the left adrenal gland mass by interventional radiology and the final pathology [phone]) showed metastatic carcinoma consistent with renal primary. The malignant cells are positive for CD10, PAX-8, and racemase. They are negative for cytokeratin 20, cytokeratin 7, Napsin-A, and TTF-1. Given the patients history, the findings are consistent with metastatic renal cell carcinoma. Dr. Haynes Kerns kindly referred the patient to me today for evaluation and recommendation regarding treatment of his condition. When seen today the patient is feeling fine with no concerning complaints.  He denied having any chest pain, shortness of breath, cough or hemoptysis.  He denied having any significant weight loss or night sweats.  He has no headache or visual changes.  He has no nausea, vomiting, diarrhea or constipation. Family history significant for mother died from old age and dementia.  Father died in a suicide attack. The patient is a widow and has 2 children his son who lives in [place] and daughter who is currently in [place].  He is currently retired and used to work as a English as a second language teacher.  The patient has a history of smoking 0.5 pack/day for around 40 years and quit around 10 years ago.  He drinks wine on daily basis and no history of drug abuse.  HPI  Past Medical History:  Diagnosis Date  . Bipolar 1 disorder ([place])   . BPH (benign prostatic hyperplasia)   . Cancer ([place])    RCC  . Cataract   . Chronic kidney disease   . COPD (chronic obstructive pulmonary disease) ([place])   .  Depression   . Diabetes mellitus without complication ([place])   . Hypertension     No past surgical history on file.  No family history on file.  Social History Social History   Tobacco Use  . Smoking status: Current Every Day Smoker  Substance Use Topics  . Alcohol use: Yes    Comment: 50 cc wild [place] daily  . Drug use: Not on file    No Known Allergies  Current Outpatient Medications  Medication Sig  Dispense Refill  . cholecalciferol (VITAMIN D3) 25 MCG (1000 UT) tablet Take 1,000 Units by mouth daily.    . Coenzyme Q10 (COQ10) 200 MG CAPS Take 1 capsule by mouth daily.    . fenofibrate (TRICOR) 48 MG tablet Take 48 mg by mouth daily.    . hydrochlorothiazide (HYDRODIURIL) 25 MG tablet Take 25 mg by mouth daily.    . Insulin Glargine (TOUJEO SOLOSTAR [place]) Inject 3 Units into the skin daily.    . insulin lispro (HUMALOG KWIKPEN) 100 UNIT/ML KwikPen Inject 3 Units into the skin every evening. With dinner    . latanoprost (XALATAN) 0.005 % ophthalmic solution Place 1 drop into both eyes at bedtime.    Marland Kitchen losartan (COZAAR) 100 MG tablet Take 100 mg by mouth daily.    . Multiple Vitamins-Minerals (MULTIVITAMIN WITH MINERALS) tablet Take 1 tablet by mouth daily.    Marland Kitchen OLANZapine (ZYPREXA) 2.5 MG tablet Take 2.5 mg by mouth at bedtime.    . sertraline (ZOLOFT) 50 MG tablet Take 50 mg by mouth daily.    . sitaGLIPtin (JANUVIA) 50 MG tablet Take 50 mg by mouth every morning.     No current facility-administered medications for this visit.     Review of Systems  Constitutional: positive for fatigue Eyes: negative Ears, nose, mouth, throat, and face: negative Respiratory: negative Cardiovascular: negative Gastrointestinal: negative Genitourinary:negative Integument/breast: negative Hematologic/lymphatic: negative Musculoskeletal:negative Neurological: negative Behavioral/Psych: negative Endocrine: negative Allergic/Immunologic: negative  Physical Exam  NGE:XBMWU, healthy, no distress, well nourished and well developed SKIN: skin color, texture, turgor are normal, no rashes or significant lesions HEAD: Normocephalic, No masses, lesions, tenderness or abnormalities EYES: normal, PERRLA, Conjunctiva are pink and non-injected EARS: External ears normal, Canals clear OROPHARYNX:no exudate, no erythema and lips, buccal mucosa, and tongue normal  NECK: supple, no adenopathy, no JVD LYMPH:   no palpable lymphadenopathy, no hepatosplenomegaly LUNGS: clear to auscultation , and palpation HEART: regular rate & rhythm, no murmurs and no gallops ABDOMEN:abdomen soft, non-tender, normal bowel sounds and no masses or organomegaly BACK: Back symmetric, no curvature., No CVA tenderness EXTREMITIES:no joint deformities, effusion, or inflammation, no edema  NEURO: alert & oriented x 3 with fluent speech, no focal motor/sensory deficits  PERFORMANCE STATUS: ECOG 1  LABORATORY DATA: Lab Results  Component Value Date   WBC 6.9 07/12/2018   HGB 16.1 07/12/2018   HCT 47.0 07/12/2018   MCV 85.3 07/12/2018   PLT 224 07/12/2018      Chemistry      Component Value Date/Time   NA 126 (L) 04/01/2007 0525   K 3.8 04/01/2007 0525   CL 87 DELTA CHECK NOTED RESULT REPEATED AND VERIFIED (L) 04/01/2007 0525   CO2 32 04/01/2007 0525   BUN 13 04/01/2007 0525   CREATININE 1.78 (H) 04/01/2007 0525      Component Value Date/Time   CALCIUM 8.3 (L) 04/01/2007 0525       RADIOGRAPHIC STUDIES: Ct Chest Wo Contrast  Addendum Date: 06/27/2018   ADDENDUM REPORT: 06/27/2018  08:53 ADDENDUM: Mediastinum: Also noted is fullness in the left infrahilar region, likely left infrahilar adenopathy measuring up to 2.4 cm. Electronically Signed   By: Rolm Baptise M.D.   On: 06/27/2018 08:53   Result Date: 06/27/2018 CLINICAL DATA:  Hemoptysis. History of left renal cell carcinoma and nephrectomy. Abnormal chest x-ray in office EXAM: CT CHEST WITHOUT CONTRAST TECHNIQUE: Multidetector CT imaging of the chest was performed following the standard protocol without IV contrast. COMPARISON:  Chest x-ray 04/16/2012.  Chest CT 04/20/2011 FINDINGS: Cardiovascular: Aortic atherosclerotic calcifications. Heart is normal size. Aorta is normal caliber. Mediastinum/Nodes: Large subcarinal nodal mass measures 5.6 x 4.4 cm. There are calcifications in the subcarinal region, likely separate calcified subcarinal lymph nodes. Small  scattered mediastinal lymph nodes elsewhere, including 9 mm AP window lymph node and similarly sized pretracheal/paratracheal lymph nodes. Lungs/Pleura: There are numerous calcified pulmonary nodules compatible with old granulomas. Several noncalcified nodules are noted, the largest 2.3 x 1.9 cm in the medial right lower lobe on image 83. Peripheral right lower lobe nodule on image 93 measures 11 mm. Right lower lobe nodule on image 113 measures 7 mm. There is airway thickening and obstruction in the right upper lobe with adjacent tubular nodule on image 50 measuring 1.4 x 2.0 cm. Nodule is noted along the surface of the left lower lobe and the posterior mediastinum posterior to the heart on image 127 measuring 1.8 cm. It is difficult to determine if this is within the lung or possible pleural nodule. Nodularity noted along both diaphragms. There is nodular opacities and interstitial thickening in both upper lobes, right greater than left most compatible with scarring, present on prior study of 2012 left progressed. No effusions. Upper Abdomen: Prior left nephrectomy. There is a soft tissue mass which appears to be extending from the left adrenal gland which measures up to 5.4 cm. This is new since 2012. Small nodule in the right adrenal gland measures 1.3 cm, new since 2012. Calcifications in the spleen. Musculoskeletal: Chest wall soft tissues are unremarkable. Sclerotic focus in a midthoracic vertebral body was present in 2012, likely bone island. Lucent areas noted along the inferior endplates at T06 and L1 is likely degenerative related. IMPRESSION: There are numerous bilateral pulmonary nodules which are suspicious for metastatic disease. Cannot exclude a primary lung cancer in the right lower lobe medially. Associated bulky subcarinal adenopathy and scattered borderline sized mediastinal lymph nodes elsewhere. Probable left infrahilar adenopathy, difficult to visualize well on this noncontrast study.  Obstruction of the right upper lobe airways which could be related to endobronchial lesion. There is adjacent nodularity in the right upper lobe. Left upper quadrant mass likely arising from the left adrenal gland. This conceivably could be recurrent tumor in the nephrectomy bed. Old granulomatous disease. Aortic atherosclerosis. These results were called by telephone at the time of interpretation on 06/27/2018 at 8:27 am to Dr. Crist Infante , who verbally acknowledged these results. Electronically Signed: By: Rolm Baptise M.D. On: 06/27/2018 08:27   Ct Biopsy  Result Date: 07/09/2018 INDICATION: 84 year old with lung lesions and a large left adrenal mass. Findings are concerning for metastatic disease and possibly a lung primary. Patient also has history of renal cell carcinoma and left nephrectomy. EXAM: CT-GUIDED BIOPSY OF LEFT ADRENAL MASS MEDICATIONS: None. ANESTHESIA/SEDATION: Moderate (conscious) sedation was employed during this procedure. A total of Versed 0.5 mg and Fentanyl 75 mcg was administered intravenously. Moderate Sedation Time: 25 minutes. The patient's level of consciousness and vital signs were monitored continuously  by radiology nursing throughout the procedure under my direct supervision. FLUOROSCOPY TIME:  None COMPLICATIONS: None immediate. PROCEDURE: Informed written consent was obtained from the patient after a thorough discussion of the procedural risks, benefits and alternatives. All questions were addressed. Maximal Sterile Barrier Technique was utilized including caps, mask, sterile gowns, sterile gloves, sterile drape, hand hygiene and skin antiseptic. A timeout was performed prior to the initiation of the procedure. Patient was placed in a left lateral decubitus position. Images through the upper abdomen were obtained. The left adrenal mass was identified. The back was prepped and draped in a sterile fashion. Skin and soft tissues anesthetized with 1% lidocaine. Using CT guidance,  17 gauge coaxial needle was directed into the left adrenal mass from a left paraspinal approach. Needle position confirmed within the posterior aspect of the lesion. Multiple core biopsies were performed with an 18 gauge device but scant bloody material was obtained. Therefore, 4 cc of blood was aspirated through the 17 gauge needle and collected for cytology. 17 gauge coaxial needle was removed without complication. Bandage placed over the puncture site. FINDINGS: Approximately 5.5 cm left adrenal mass. Needle position confirmed within the lesion. Bloody scant material was obtained from the core biopsies. Approximately 4 mL of blood was aspirated from the 17 gauge coaxial needle. Minimal blood around the adrenal lesion after the biopsy. IMPRESSION: CT-guided core biopsies and aspiration from the left adrenal mass. Electronically Signed   By: Markus Daft M.D.   On: 07/09/2018 13:54    ASSESSMENT: This is a very pleasant 84 years old white male recently diagnosed with metastatic renal cell carcinoma in December 2019 that was initially diagnosed as a stage Ia (T1 a, N0, M0) in August 2008 status post left nephrectomy.  He currently present with numerous bilateral pulmonary nodules in addition to large mediastinal lymphadenopathy and metastatic left adrenal mass.   PLAN: I had a lengthy discussion with the patient today about his current disease stage, prognosis and treatment options. I personally and independently reviewed the scan images and discussed the result and showed the images to the patient today. I explained to the patient that he has incurable condition and all the treatment will be of palliative nature. I recommended for the patient to complete the staging work-up by ordering a PET scan as well as MRI of the brain to rule out any other metastatic lesions. I discussed with the patient his treatment options including palliative care and hospice referral versus palliative treatment with immunotherapy  with a combination of ipilimumab 1 mg/KG and nivolumab 3 mg/KG every 3 weeks for 4 cycles followed by maintenance treatment with nivolumab if the patient has no evidence for disease progression after cycle #4. The patient is interested in treatment with this immunotherapy.  I discussed with him the adverse effect of this treatment including but not limited to immunotherapy mediated skin rash, diarrhea, inflammation of the lung, kidney, liver, thyroid or other endocrine dysfunction including type 1 diabetes mellitus. The patient is expected to start the first cycle of this treatment after Christmas. I will arrange for the patient to have a chemotherapy education class before the first dose of his treatment. I will see him back for follow-up visit in around 2 weeks for evaluation and discussion of the pending imaging studies before starting the first dose of his treatment. He was advised to call immediately if he has any concerning symptoms in the interval. The patient voices understanding of current disease status and treatment options and is  in agreement with the current care plan.  All questions were answered. The patient knows to call the clinic with any problems, questions or concerns. We can certainly see the patient much sooner if necessary.  Thank you so much for allowing me to participate in the care of Dimas Chyle. I will continue to follow up the patient with you and assist in his care.  I spent 55 minutes counseling the patient face to face. The total time spent in the appointment was 80 minutes.  Disclaimer: This note was dictated with voice recognition software. Similar sounding words can inadvertently be transcribed and may not be corrected upon review.   Eilleen Kempf July 12, 2018, 1:50 PM

## 2018-07-12 NOTE — Progress Notes (Signed)
START ON PATHWAY REGIMEN - Renal Cell     A cycle is every 21 days:     Nivolumab      Ipilimumab    A cycle is every 28 days:     Nivolumab   **Always confirm dose/schedule in your pharmacy ordering system**  Patient Characteristics: Metastatic, Clear Cell, First Line, Intermediate or Poor Risk AJCC M Category: M1 AJCC 8 Stage Grouping: IV Current evidence of distant metastases<= Yes AJCC T Category: T1a AJCC N Category: N0 Does patient have oligometastatic disease<= No Histology: Clear Cell Line of Therapy: First Line Risk Status: Intermediate Risk Intent of Therapy: Non-Curative / Palliative Intent, Discussed with Patient

## 2018-07-12 NOTE — Progress Notes (Signed)
The proposed treatment discussed in cancer conference 07/12/18 is for discussion purpose only and is not a binding recommendation. The patient was not physically examined nor present therefore, final treatment plans cannot be decided.  

## 2018-07-12 NOTE — Telephone Encounter (Signed)
Printed avs and calender of upcoming appointment. Per 12/12 los 

## 2018-07-12 NOTE — Telephone Encounter (Signed)
Per vate on 07-12-18 cancel appt don't need to see Dr.Moody

## 2018-07-14 ENCOUNTER — Encounter: Payer: Self-pay | Admitting: Internal Medicine

## 2018-07-14 DIAGNOSIS — Z5112 Encounter for antineoplastic immunotherapy: Secondary | ICD-10-CM | POA: Insufficient documentation

## 2018-07-14 DIAGNOSIS — Z7189 Other specified counseling: Secondary | ICD-10-CM | POA: Insufficient documentation

## 2018-07-16 ENCOUNTER — Inpatient Hospital Stay: Payer: Medicare Other

## 2018-07-16 DIAGNOSIS — R5382 Chronic fatigue, unspecified: Secondary | ICD-10-CM

## 2018-07-16 DIAGNOSIS — Z5112 Encounter for antineoplastic immunotherapy: Secondary | ICD-10-CM | POA: Diagnosis not present

## 2018-07-16 DIAGNOSIS — C649 Malignant neoplasm of unspecified kidney, except renal pelvis: Secondary | ICD-10-CM

## 2018-07-16 DIAGNOSIS — C7802 Secondary malignant neoplasm of left lung: Principal | ICD-10-CM

## 2018-07-16 LAB — CMP (CANCER CENTER ONLY)
ALBUMIN: 4.5 g/dL (ref 3.5–5.0)
ALT: 26 U/L (ref 0–44)
AST: 18 U/L (ref 15–41)
Alkaline Phosphatase: 62 U/L (ref 38–126)
Anion gap: 12 (ref 5–15)
BILIRUBIN TOTAL: 0.8 mg/dL (ref 0.3–1.2)
BUN: 29 mg/dL — ABNORMAL HIGH (ref 8–23)
CO2: 26 mmol/L (ref 22–32)
Calcium: 10.9 mg/dL — ABNORMAL HIGH (ref 8.9–10.3)
Chloride: 94 mmol/L — ABNORMAL LOW (ref 98–111)
Creatinine: 1.93 mg/dL — ABNORMAL HIGH (ref 0.61–1.24)
GFR, Est AFR Am: 36 mL/min — ABNORMAL LOW (ref >60)
GFR, Estimated: 31 mL/min — ABNORMAL LOW (ref >60)
GLUCOSE: 251 mg/dL — AB (ref 70–99)
Potassium: 4.1 mmol/L (ref 3.5–5.1)
Sodium: 132 mmol/L — ABNORMAL LOW (ref 135–145)
Total Protein: 8.3 g/dL — ABNORMAL HIGH (ref 6.5–8.1)

## 2018-07-16 LAB — CBC WITH DIFFERENTIAL (CANCER CENTER ONLY)
Abs Immature Granulocytes: 0.05 10*3/uL (ref 0.00–0.07)
Basophils Absolute: 0.1 10*3/uL (ref 0.0–0.1)
Basophils Relative: 1 %
Eosinophils Absolute: 0.3 10*3/uL (ref 0.0–0.5)
Eosinophils Relative: 4 %
HEMATOCRIT: 49.5 % (ref 39.0–52.0)
Hemoglobin: 16.7 g/dL (ref 13.0–17.0)
Immature Granulocytes: 1 %
LYMPHS ABS: 1.5 10*3/uL (ref 0.7–4.0)
Lymphocytes Relative: 19 %
MCH: 28.8 pg (ref 26.0–34.0)
MCHC: 33.7 g/dL (ref 30.0–36.0)
MCV: 85.3 fL (ref 80.0–100.0)
Monocytes Absolute: 0.6 10*3/uL (ref 0.1–1.0)
Monocytes Relative: 7 %
Neutro Abs: 5.5 10*3/uL (ref 1.7–7.7)
Neutrophils Relative %: 68 %
Platelet Count: 245 10*3/uL (ref 150–400)
RBC: 5.8 MIL/uL (ref 4.22–5.81)
RDW: 12.7 % (ref 11.5–15.5)
WBC Count: 8.1 10*3/uL (ref 4.0–10.5)
nRBC: 0 % (ref 0.0–0.2)

## 2018-07-16 LAB — TSH: TSH: 2.603 u[IU]/mL (ref 0.320–4.118)

## 2018-07-19 ENCOUNTER — Telehealth: Payer: Self-pay

## 2018-07-19 NOTE — Telephone Encounter (Signed)
Spoke with patient he is aware of infusion being added on for 12/26.per 12/12 los

## 2018-07-20 ENCOUNTER — Telehealth: Payer: Self-pay

## 2018-07-20 NOTE — Telephone Encounter (Signed)
Unable to leave a msg concerning inf. Approval. Per 12/20 follow up calls to verify appointmwnt

## 2018-07-23 ENCOUNTER — Ambulatory Visit (HOSPITAL_COMMUNITY)
Admission: RE | Admit: 2018-07-23 | Discharge: 2018-07-23 | Disposition: A | Discharge status: Home / Self Care | Payer: Medicare Other | Source: Ambulatory Visit | Attending: Internal Medicine | Admitting: Internal Medicine

## 2018-07-23 DIAGNOSIS — C649 Malignant neoplasm of unspecified kidney, except renal pelvis: Secondary | ICD-10-CM | POA: Diagnosis not present

## 2018-07-23 DIAGNOSIS — R918 Other nonspecific abnormal finding of lung field: Secondary | ICD-10-CM | POA: Insufficient documentation

## 2018-07-23 MED ORDER — GADOBUTROL 1 MMOL/ML IV SOLN
8.0000 mL | Freq: Once | INTRAVENOUS | Status: AC | PRN
  Administered 2018-07-23: 8 mL via INTRAVENOUS

## 2018-07-24 ENCOUNTER — Encounter: Payer: Self-pay | Admitting: Internal Medicine

## 2018-07-24 NOTE — Progress Notes (Signed)
Called pt to introduce myself as his Arboriculturist.  Unfortunately there aren't any foundations offering copay assistance for his Dx and the type of ins he has.  I offered the [employer] and went over what it covers.  Pt declined the grant at this time.  I will give him my card on 07/26/18 for any questions or concerns he may have in the future.

## 2018-07-26 ENCOUNTER — Encounter (HOSPITAL_COMMUNITY): Payer: Self-pay

## 2018-07-26 ENCOUNTER — Ambulatory Visit (HOSPITAL_COMMUNITY)
Admission: RE | Admit: 2018-07-26 | Discharge: 2018-07-26 | Disposition: A | Discharge status: Home / Self Care | Payer: Medicare Other | Source: Ambulatory Visit | Attending: Internal Medicine | Admitting: Internal Medicine

## 2018-07-26 ENCOUNTER — Inpatient Hospital Stay: Payer: Medicare Other

## 2018-07-26 ENCOUNTER — Inpatient Hospital Stay (HOSPITAL_BASED_OUTPATIENT_CLINIC_OR_DEPARTMENT_OTHER): Payer: Medicare Other | Admitting: Medical

## 2018-07-26 VITALS — BP 143/82 | HR 84 | Temp 97.7°F | Resp 16

## 2018-07-26 VITALS — BP 160/93 | HR 93 | Temp 97.8°F | Resp 18 | Ht 68.0 in | Wt 182.6 lb

## 2018-07-26 DIAGNOSIS — C7972 Secondary malignant neoplasm of left adrenal gland: Secondary | ICD-10-CM

## 2018-07-26 DIAGNOSIS — F1020 Alcohol dependence, uncomplicated: Secondary | ICD-10-CM

## 2018-07-26 DIAGNOSIS — Z85528 Personal history of other malignant neoplasm of kidney: Secondary | ICD-10-CM

## 2018-07-26 DIAGNOSIS — Z794 Long term (current) use of insulin: Secondary | ICD-10-CM

## 2018-07-26 DIAGNOSIS — C649 Malignant neoplasm of unspecified kidney, except renal pelvis: Secondary | ICD-10-CM

## 2018-07-26 DIAGNOSIS — C7802 Secondary malignant neoplasm of left lung: Principal | ICD-10-CM

## 2018-07-26 DIAGNOSIS — Z79899 Other long term (current) drug therapy: Secondary | ICD-10-CM

## 2018-07-26 DIAGNOSIS — R918 Other nonspecific abnormal finding of lung field: Secondary | ICD-10-CM | POA: Insufficient documentation

## 2018-07-26 DIAGNOSIS — Z5112 Encounter for antineoplastic immunotherapy: Secondary | ICD-10-CM | POA: Diagnosis not present

## 2018-07-26 DIAGNOSIS — N189 Chronic kidney disease, unspecified: Secondary | ICD-10-CM

## 2018-07-26 DIAGNOSIS — C7801 Secondary malignant neoplasm of right lung: Secondary | ICD-10-CM | POA: Diagnosis not present

## 2018-07-26 DIAGNOSIS — I129 Hypertensive chronic kidney disease with stage 1 through stage 4 chronic kidney disease, or unspecified chronic kidney disease: Secondary | ICD-10-CM

## 2018-07-26 DIAGNOSIS — R5383 Other fatigue: Secondary | ICD-10-CM

## 2018-07-26 DIAGNOSIS — Z87891 Personal history of nicotine dependence: Secondary | ICD-10-CM

## 2018-07-26 DIAGNOSIS — E1122 Type 2 diabetes mellitus with diabetic chronic kidney disease: Secondary | ICD-10-CM

## 2018-07-26 LAB — CBC WITH DIFFERENTIAL (CANCER CENTER ONLY)
Abs Immature Granulocytes: 0.03 10*3/uL (ref 0.00–0.07)
Basophils Absolute: 0.1 10*3/uL (ref 0.0–0.1)
Basophils Relative: 1 %
Eosinophils Absolute: 0.3 10*3/uL (ref 0.0–0.5)
Eosinophils Relative: 4 %
HCT: 46.5 % (ref 39.0–52.0)
Hemoglobin: 16 g/dL (ref 13.0–17.0)
Immature Granulocytes: 0 %
LYMPHS ABS: 2.1 10*3/uL (ref 0.7–4.0)
Lymphocytes Relative: 29 %
MCH: 29.1 pg (ref 26.0–34.0)
MCHC: 34.4 g/dL (ref 30.0–36.0)
MCV: 84.5 fL (ref 80.0–100.0)
MONO ABS: 0.7 10*3/uL (ref 0.1–1.0)
Monocytes Relative: 9 %
Neutro Abs: 4.2 10*3/uL (ref 1.7–7.7)
Neutrophils Relative %: 57 %
Platelet Count: 202 10*3/uL (ref 150–400)
RBC: 5.5 MIL/uL (ref 4.22–5.81)
RDW: 12.5 % (ref 11.5–15.5)
WBC Count: 7.4 10*3/uL (ref 4.0–10.5)
nRBC: 0 % (ref 0.0–0.2)

## 2018-07-26 LAB — CMP (CANCER CENTER ONLY)
ALK PHOS: 59 U/L (ref 38–126)
ALT: 26 U/L (ref 0–44)
AST: 20 U/L (ref 15–41)
Albumin: 4.2 g/dL (ref 3.5–5.0)
Anion gap: 11 (ref 5–15)
BILIRUBIN TOTAL: 0.8 mg/dL (ref 0.3–1.2)
BUN: 30 mg/dL — ABNORMAL HIGH (ref 8–23)
CALCIUM: 10.2 mg/dL (ref 8.9–10.3)
CO2: 26 mmol/L (ref 22–32)
Chloride: 93 mmol/L — ABNORMAL LOW (ref 98–111)
Creatinine: 1.95 mg/dL — ABNORMAL HIGH (ref 0.61–1.24)
GFR, Est AFR Am: 35 mL/min — ABNORMAL LOW (ref >60)
GFR, Estimated: 30 mL/min — ABNORMAL LOW (ref >60)
Glucose, Bld: 121 mg/dL — ABNORMAL HIGH (ref 70–99)
Potassium: 4.3 mmol/L (ref 3.5–5.1)
Sodium: 130 mmol/L — ABNORMAL LOW (ref 135–145)
TOTAL PROTEIN: 7.5 g/dL (ref 6.5–8.1)

## 2018-07-26 LAB — GLUCOSE, CAPILLARY: Glucose-Capillary: 139 mg/dL — ABNORMAL HIGH (ref 70–99)

## 2018-07-26 MED ORDER — SODIUM CHLORIDE 0.9 % IV SOLN
Freq: Once | INTRAVENOUS | Status: AC
  Administered 2018-07-26: 12:00:00 via INTRAVENOUS
  Filled 2018-07-26: qty 250

## 2018-07-26 MED ORDER — SODIUM CHLORIDE 0.9 % IV SOLN
1.0000 mg/kg | Freq: Once | INTRAVENOUS | Status: AC
  Administered 2018-07-26: 80 mg via INTRAVENOUS
  Filled 2018-07-26: qty 16

## 2018-07-26 MED ORDER — FAMOTIDINE IN NACL 20-0.9 MG/50ML-% IV SOLN
20.0000 mg | Freq: Once | INTRAVENOUS | Status: AC
  Administered 2018-07-26: 20 mg via INTRAVENOUS

## 2018-07-26 MED ORDER — DIPHENHYDRAMINE HCL 50 MG/ML IJ SOLN
25.0000 mg | Freq: Once | INTRAMUSCULAR | Status: AC
  Administered 2018-07-26: 25 mg via INTRAVENOUS

## 2018-07-26 MED ORDER — SODIUM CHLORIDE 0.9 % IV SOLN
2.9300 mg/kg | Freq: Once | INTRAVENOUS | Status: AC
  Administered 2018-07-26: 240 mg via INTRAVENOUS
  Filled 2018-07-26: qty 24

## 2018-07-26 MED ORDER — FAMOTIDINE IN NACL 20-0.9 MG/50ML-% IV SOLN
INTRAVENOUS | Status: AC
  Filled 2018-07-26: qty 50

## 2018-07-26 MED ORDER — DIPHENHYDRAMINE HCL 50 MG/ML IJ SOLN
INTRAMUSCULAR | Status: AC
  Filled 2018-07-26: qty 1

## 2018-07-26 NOTE — Patient Instructions (Signed)
PET Scan A PET scan (positron emission tomography) is a test that creates pictures of the inside of your body. For the test, a small amount of radioactive material is injected into a vein. A special scanner then takes pictures of your body. The pictures created during a PET scan can be used to study diseases, like cancer. The colors and brightness on the pictures show different levels of organ and tissue function. For example, cancer tissue appears brighter than normal tissue on a PET scan image. Tell a health care provider about:  Any allergies you have.  All medicines you are taking, including vitamins, herbs, eye drops, creams, and over-the-counter medicines.  Any blood disorders you have.  Any surgeries you have had.  Any medical conditions you have.  If you are afraid of cramped spaces (claustrophobic). If claustrophobia is a problem, it usually can be relieved with a medicine to help you relax (sedative) or a medicine to treat anxiety.  If you have trouble staying still for long periods of time.  Whether you are pregnant or may be pregnant. What are the risks? Generally, this is a safe test. However, problems may occur, including:  Bleeding, pain, or swelling at the injection site.  Allergic reactions to the radioactive material. This is rare. What happens before the procedure?  Do not eat or drink anything after midnight on the night before the procedure, or as directed by your health care provider.  Take medicines only as directed by your health care provider.  Tell your health care provider if you are pregnant or breastfeeding.  If you have diabetes, ask your health care provider for diet guidelines to control your blood sugar (glucose) levels on the day of the test. What happens during the procedure?  An IV will be inserted into one of your veins.  A small amount of radioactive material will be injected into a vein.  You will wait 30-60 minutes after the injection.  This allows the material to travel through your body.  You will lie on a cushioned table, and the table will be moved through the center of an imaging machine similar to a CT scanner.  Pictures of your body will be taken. It will take about 30-60 minutes for the machine to produce the pictures. You will need to stay very still during this time. What happens after the procedure?   Ask your health care provider, or the department that is doing the test: ? When will my results be ready? ? How will I get my results? ? What are my treatment options? ? What other tests do I need? ? What are my next steps?  You may resume your normal diet and activities.  Drink 6-8 glasses of water after the test to flush the radioactive material out of your body. Drink enough fluid to keep your urine pale yellow. Summary  A PET scan is a test that creates pictures of the inside of the body. PET stands for positron emission tomography.  For this test, a small dose of a harmless radioactive material is injected into a vein. It will travel through your body in 30-60 minutes.  While lying down and staying very still, you will be moved through a machine that takes pictures of your body. This will take 30-60 minutes.  The colors and brightness on the pictures show different levels of organ and tissue function. For example, cancer tissue appears brighter than normal tissue on a PET scan image. This information is not intended  to replace advice given to you by your health care provider. Make sure you discuss any questions you have with your health care provider. Document Released: 01/22/2003 Document Revised: 08/09/2017 Document Reviewed: 08/09/2017 Elsevier Interactive Patient Education  2019 [employer].

## 2018-07-26 NOTE — Patient Instructions (Signed)
[facility] Discharge Instructions for Patients Receiving Chemotherapy  Today you received the following chemotherapy agents: Rae Halsted.  To help prevent nausea and vomiting after your treatment, we encourage you to take your nausea medication as prescribed.   If you develop nausea and vomiting that is not controlled by your nausea medication, call the clinic.   BELOW ARE SYMPTOMS THAT SHOULD BE REPORTED IMMEDIATELY:  *FEVER GREATER THAN 100.5 F  *CHILLS WITH OR WITHOUT FEVER  NAUSEA AND VOMITING THAT IS NOT CONTROLLED WITH YOUR NAUSEA MEDICATION  *UNUSUAL SHORTNESS OF BREATH  *UNUSUAL BRUISING OR BLEEDING  TENDERNESS IN MOUTH AND THROAT WITH OR WITHOUT PRESENCE OF ULCERS  *URINARY PROBLEMS  *BOWEL PROBLEMS  UNUSUAL RASH Items with * indicate a potential emergency and should be followed up as soon as possible.  Feel free to call the clinic should you have any questions or concerns. The clinic phone number is [phone].  Please show the [facility] at check-in to the Emergency Department and triage nurse.   Nivolumab injection (Opdivo) What is this medicine? NIVOLUMAB (nye VOL ue mab) is a monoclonal antibody. It is used to treat melanoma, lung cancer, kidney cancer, head and neck cancer, Hodgkin lymphoma, urothelial cancer, colon cancer, and liver cancer. This medicine may be used for other purposes; ask your health care provider or pharmacist if you have questions. COMMON BRAND NAME(S): Opdivo What should I tell my health care provider before I take this medicine? They need to know if you have any of these conditions: -diabetes -immune system problems -kidney disease -liver disease -lung disease -organ transplant -stomach or intestine problems -thyroid disease -an unusual or allergic reaction to nivolumab, other medicines, foods, dyes, or preservatives -pregnant or trying to get pregnant -breast-feeding How should I use this  medicine? This medicine is for infusion into a vein. It is given by a health care professional in a hospital or clinic setting. A special MedGuide will be given to you before each treatment. Be sure to read this information carefully each time. Talk to your pediatrician regarding the use of this medicine in children. While this drug may be prescribed for children as young as 12 years for selected conditions, precautions do apply. Overdosage: If you think you have taken too much of this medicine contact a poison control center or emergency room at once. NOTE: This medicine is only for you. Do not share this medicine with others. What if I miss a dose? It is important not to miss your dose. Call your doctor or health care professional if you are unable to keep an appointment. What may interact with this medicine? Interactions have not been studied. Give your health care provider a list of all the medicines, herbs, non-prescription drugs, or dietary supplements you use. Also tell them if you smoke, drink alcohol, or use illegal drugs. Some items may interact with your medicine. This list may not describe all possible interactions. Give your health care provider a list of all the medicines, herbs, non-prescription drugs, or dietary supplements you use. Also tell them if you smoke, drink alcohol, or use illegal drugs. Some items may interact with your medicine. What should I watch for while using this medicine? This drug may make you feel generally unwell. Continue your course of treatment even though you feel ill unless your doctor tells you to stop. You may need blood work done while you are taking this medicine. Do not become pregnant while taking this medicine or for 5  months after stopping it. Women should inform their doctor if they wish to become pregnant or think they might be pregnant. There is a potential for serious side effects to an unborn child. Talk to your health care professional or  pharmacist for more information. Do not breast-feed an infant while taking this medicine or for 5 months after stopping it. What side effects may I notice from receiving this medicine? Side effects that you should report to your doctor or health care professional as soon as possible: -allergic reactions like skin rash, itching or hives, swelling of the face, lips, or tongue -breathing problems -blood in the urine -bloody or watery diarrhea or black, tarry stools -changes in emotions or moods -changes in vision -chest pain -cough -dizziness -feeling faint or lightheaded, falls -fever, chills -headache with fever, neck stiffness, confusion, loss of memory, sensitivity to light, hallucination, loss of contact with reality, or seizures -joint pain -mouth sores -redness, blistering, peeling or loosening of the skin, including inside the mouth -severe muscle pain or weakness -signs and symptoms of high blood sugar such as dizziness; dry mouth; dry skin; fruity breath; nausea; stomach pain; increased hunger or thirst; increased urination -signs and symptoms of kidney injury like trouble passing urine or change in the amount of urine -signs and symptoms of liver injury like dark yellow or brown urine; general ill feeling or flu-like symptoms; light-colored stools; loss of appetite; nausea; right upper belly pain; unusually weak or tired; yellowing of the eyes or skin -swelling of the ankles, feet, hands -trouble passing urine or change in the amount of urine -unusually weak or tired -weight gain or loss Side effects that usually do not require medical attention (report to your doctor or health care professional if they continue or are bothersome): -bone pain -constipation -decreased appetite -diarrhea -muscle pain -nausea, vomiting -tiredness This list may not describe all possible side effects. Call your doctor for medical advice about side effects. You may report side effects to FDA at  1-800-FDA-1088. Where should I keep my medicine? This drug is given in a hospital or clinic and will not be stored at home. NOTE: This sheet is a summary. It may not cover all possible information. If you have questions about this medicine, talk to your doctor, pharmacist, or health care provider.  2019 Elsevier/Gold Standard (2017-12-06 12:55:04)   Ipilimumab injection Curt Bears) What is this medicine? IPILIMUMAB (IP i LIM ue mab) is a monoclonal antibody. It is used to treat melanoma, colorectal cancer, and kidney cancer. This medicine may be used for other purposes; ask your health care provider or pharmacist if you have questions. COMMON BRAND NAME(S): YERVOY What should I tell my health care provider before I take this medicine? They need to know if you have any of these conditions: -Addison's disease -blood in your stools (black or tarry stools) or if you have blood in your vomit -eye disease, vision problems -history of pancreatitis -history of stomach bleeding -immune system problems -inflammatory bowel disease -kidney disease -liver disease -lupus -myasthenia gravis -organ transplant -rheumatoid arthritis -sarcoidosis -stomach or intestine problems -thyroid disease -tingling of the fingers or toes, or other nerve disorder -an unusual or allergic reaction to ipilimumab, other medicines, foods, dyes, or preservatives -pregnant or trying to get pregnant -breast-feeding How should I use this medicine? This medicine is for infusion into a vein. It is given by a health care professional in a hospital or clinic setting. A special MedGuide will be given to you before each treatment. Be  sure to read this information carefully each time. Talk to your pediatrician regarding the use of this medicine in children. While this drug may be prescribed for children as young as 12 years for selected conditions, precautions do apply. Overdosage: If you think you have taken too much of this  medicine contact a poison control center or emergency room at once. NOTE: This medicine is only for you. Do not share this medicine with others. What if I miss a dose? It is important not to miss your dose. Call your doctor or health care professional if you are unable to keep an appointment. What may interact with this medicine? Interactions are not expected. This list may not describe all possible interactions. Give your health care provider a list of all the medicines, herbs, non-prescription drugs, or dietary supplements you use. Also tell them if you smoke, drink alcohol, or use illegal drugs. Some items may interact with your medicine. What should I watch for while using this medicine? Tell your doctor or healthcare professional if your symptoms do not start to get better or if they get worse. Do not become pregnant while taking this medicine or for 3 months after stopping it. Women should inform their doctor if they wish to become pregnant or think they might be pregnant. There is a potential for serious side effects to an unborn child. Talk to your health care professional or pharmacist for more information. Do not breast-feed an infant while taking this medicine or for 3 months after the last dose. Your condition will be monitored carefully while you are receiving this medicine. You may need blood work done while you are taking this medicine. What side effects may I notice from receiving this medicine? Side effects that you should report to your doctor or health care professional as soon as possible: -allergic reactions like skin rash, itching or hives, swelling of the face, lips, or tongue -black, tarry stools -bloody or watery diarrhea -changes in vision -dizziness -eye pain -fast, irregular heartbeat -feeling anxious -feeling faint or lightheaded, falls -nausea, vomiting -pain, tingling, numbness in the hands or feet -redness, blistering, peeling or loosening of the skin,  including inside the mouth -signs and symptoms of liver injury like dark yellow or brown urine; general ill feeling or flu-like symptoms; light-colored stools; loss of appetite; nausea; right upper belly pain; unusually weak or tired; yellowing of the eyes or skin -unusual bleeding or bruising Side effects that usually do not require medical attention (report to your doctor or health care professional if they continue or are bothersome): -headache -loss of appetite -trouble sleeping This list may not describe all possible side effects. Call your doctor for medical advice about side effects. You may report side effects to FDA at 1-800-FDA-1088. Where should I keep my medicine? This drug is given in a hospital or clinic and will not be stored at home. NOTE: This sheet is a summary. It may not cover all possible information. If you have questions about this medicine, talk to your doctor, pharmacist, or health care provider.  2019 Elsevier/Gold Standard (2017-11-10 13:21:42)

## 2018-07-26 NOTE — Progress Notes (Signed)
Pt to be seen by PA Lucianne Lei today on behalf of MD [facility] for check in appt before his chemo this afternoon.  Pt ambulatory to radiology for PET scan then will return for treatment today.

## 2018-07-26 NOTE — Progress Notes (Signed)
Okay to tx with elevated Cr per Dr. Presley Raddle.

## 2018-07-27 ENCOUNTER — Telehealth: Payer: Self-pay | Admitting: Medical

## 2018-07-27 NOTE — Progress Notes (Signed)
[facility] [phone]   [phone]  CONSULT NOTE  REFERRING PHYSICIAN: Dr. Crist Infante  HPI Blake Hines is a 84 y.o. male with past medical history significant for bipolar disorder, benign prostatic hypertrophy, stage Ia renal cell carcinoma diagnosed in 2008 status post left nephrectomy on March 29, 2007 with the final pathology consistent with 3.8 cm clear cell carcinoma, T1a.  The patient also has a history of COPD, depression, diabetes mellitus as well as hypertension.  He also has a long history of smoking but quit 10 years ago.  He was seen by his primary care physician Dr. Haynes Kerns complaining of cough and blood in mid October 2019.  Chest x-ray was performed in his office and showed some abnormality in the lung.  This was followed by CT scan of the chest without contrast on June 26, 2018 and it showed numerous calcified pulmonary nodules compatible with old granulomas.  There was also several noncalcified nodules noted the largest measured 2.3 x 1.9 cm in the medial right lower lobe, peripheral right lower lobe nodule measuring 1.1 cm, right lower lobe nodule measuring 0.7 cm.  There was also a nodule noted along the service of the left lower lobe and posterior mediastinum posterior to the heart measuring 1.8 cm.  The scan also showed large subcarinal nodal mass measuring 5.6 x 4.4 cm in addition to small scattered mediastinal lymph nodes elsewhere including 0.9 cm AP window lymph node and similarly sized pretracheal/paratracheal lymph nodes.  The upper abdominal portion of the scan showed a soft tissue mass which appears to be extending from the left adrenal gland and measuring 5.4 cm.  This is new since 2012.  There was also a small nodule in the right adrenal gland measuring 1.3 cm that is also new from 2012. On July 09, 2018 the patient underwent CT-guided core biopsy of the left adrenal gland mass by interventional radiology and the final  pathology [phone]) showed metastatic carcinoma consistent with renal primary. The malignant cells are positive for CD10, PAX-8, and racemase. They are negative for cytokeratin 20, cytokeratin 7, Napsin-A, and TTF-1. Given the patients history, the findings are consistent with metastatic renal cell carcinoma.  Dr. Haynes Kerns kindly referred the patient to me for evaluation and recommendation regarding treatment of his condition.    Blake Hines was originally seen by Dr. Julien Nordmann on 07/12/2018.  A PET scan was completed on 07/26/2018.  Blake Hines received cycle 1 of Opdivo and Yervoy on 07/26/2018.  Family history significant for mother died from old age and dementia.  Father died in a suicide attack. The patient is a widow and has 2 children his son who lives in [place] and daughter who is currently in [place].  He is currently retired and used to work as a English as a second language teacher.  The patient has a history of smoking 0.5 pack/day for around 40 years and quit around 10 years ago.  He drinks wine on daily basis and no history of drug abuse.  HPI  Past Medical History:  Diagnosis Date  . Bipolar 1 disorder ([place])   . BPH (benign prostatic hyperplasia)   . Cancer ([place])    RCC  . Cataract   . Chronic kidney disease   . COPD (chronic obstructive pulmonary disease) ([place])   . Depression   . Diabetes mellitus without complication ([place])   . Hypertension     No past surgical history on file.  No family history on file.  Social History  Social History   Tobacco Use  . Smoking status: Former Smoker    Packs/day: 0.50    Years: 40.00    Pack years: 20.00    Types: Cigarettes    Last attempt to quit: 07/13/2003    Years since quitting: 15.0  . Smokeless tobacco: Never Used  Substance Use Topics  . Alcohol use: Yes    Comment: 50 cc wild [place] daily  . Drug use: Not on file    No Known Allergies  Current Outpatient Medications  Medication Sig Dispense Refill  . cholecalciferol (VITAMIN D3) 25  MCG (1000 UT) tablet Take 1,000 Units by mouth daily.    . Coenzyme Q10 (COQ10) 200 MG CAPS Take 1 capsule by mouth daily.    . fenofibrate (TRICOR) 48 MG tablet Take 48 mg by mouth daily.    Marland Kitchen FREESTYLE LITE test strip USE TO TEST BLOOD SUGAR TWICE DAILY (E11.29)  1  . hydrochlorothiazide (HYDRODIURIL) 25 MG tablet Take 25 mg by mouth daily.    . Insulin Glargine (TOUJEO SOLOSTAR [place]) Inject 3 Units into the skin daily.    . insulin lispro (HUMALOG KWIKPEN) 100 UNIT/ML KwikPen Inject 3 Units into the skin every evening. With dinner    . latanoprost (XALATAN) 0.005 % ophthalmic solution Place 1 drop into both eyes at bedtime.    Marland Kitchen losartan (COZAAR) 100 MG tablet Take 100 mg by mouth daily.    . Multiple Vitamins-Minerals (MULTIVITAMIN WITH MINERALS) tablet Take 1 tablet by mouth daily.    Marland Kitchen OLANZapine (ZYPREXA) 2.5 MG tablet Take 2.5 mg by mouth at bedtime.    . sertraline (ZOLOFT) 50 MG tablet Take 50 mg by mouth daily.    . sitaGLIPtin (JANUVIA) 50 MG tablet Take 50 mg by mouth every morning.     No current facility-administered medications for this visit.     Review of Systems  Constitutional: Negative for appetite change, chills, diaphoresis, fever and unexpected weight change.  HENT:   Negative for trouble swallowing.   Respiratory: Negative for cough and shortness of breath.   Cardiovascular: Negative for chest pain and palpitations.  Gastrointestinal: Negative for abdominal distention, abdominal pain, blood in stool, constipation, diarrhea, nausea and vomiting.  Musculoskeletal: Negative for arthralgias and back pain.   Physical Exam Constitutional:      General: He is not in acute distress.    Appearance: He is not diaphoretic.  HENT:     Head: Normocephalic and atraumatic.  Cardiovascular:     Rate and Rhythm: Normal rate and regular rhythm.     Heart sounds: Normal heart sounds. No murmur. No friction rub. No gallop.   Pulmonary:     Effort: Pulmonary effort is normal. No  respiratory distress.     Breath sounds: Normal breath sounds. No wheezing or rales.  Skin:    General: Skin is warm and dry.     Findings: No erythema or rash.  Neurological:     General: No focal deficit present.     Mental Status: He is alert.     Coordination: Coordination normal.  Psychiatric:        Mood and Affect: Mood normal.        Behavior: Behavior normal.        Thought Content: Thought content normal.        Judgment: Judgment normal.     PERFORMANCE STATUS: ECOG 1  LABORATORY DATA: Lab Results  Component Value Date   WBC 7.4 07/26/2018   HGB  16.0 07/26/2018   HCT 46.5 07/26/2018   MCV 84.5 07/26/2018   PLT 202 07/26/2018      Chemistry      Component Value Date/Time   NA 130 (L) 07/26/2018 0839   K 4.3 07/26/2018 0839   CL 93 (L) 07/26/2018 0839   CO2 26 07/26/2018 0839   BUN 30 (H) 07/26/2018 0839   CREATININE 1.95 (H) 07/26/2018 0839      Component Value Date/Time   CALCIUM 10.2 07/26/2018 0839   ALKPHOS 59 07/26/2018 0839   AST 20 07/26/2018 0839   ALT 26 07/26/2018 0839   BILITOT 0.8 07/26/2018 0839       RADIOGRAPHIC STUDIES: Mr Jeri Cos VE Contrast  Result Date: 07/23/2018 CLINICAL DATA:  Lung mass. Kidney cancer. Staging for metastatic disease. EXAM: MRI HEAD WITHOUT AND WITH CONTRAST TECHNIQUE: Multiplanar, multiecho pulse sequences of the brain and surrounding structures were obtained without and with intravenous contrast. CONTRAST:  8 mL Gadovist IV COMPARISON:  None. FINDINGS: Brain: Generalized atrophy. Patchy white matter hyperintensities bilaterally consistent with chronic ischemia. Small hyperintensity in the mid pons. Negative for acute infarct. Negative for hemorrhage or mass Normal enhancement.  No enhancing metastatic deposits in the brain. Vascular: Normal arterial flow voids.  Normal venous enhancement. Skull and upper cervical spine: No skull lesions. Sinuses/Orbits: Mild mucosal edema paranasal sinuses and left mastoid sinus.  Large retention cyst left maxillary sinus. Bilateral cataract surgery Other: None IMPRESSION: Negative for metastatic disease to the brain.  No acute abnormality. Electronically Signed   By: Franchot Gallo M.D.   On: 07/23/2018 11:37   Ct Biopsy  Result Date: 07/09/2018 INDICATION: 84 year old with lung lesions and a large left adrenal mass. Findings are concerning for metastatic disease and possibly a lung primary. Patient also has history of renal cell carcinoma and left nephrectomy. EXAM: CT-GUIDED BIOPSY OF LEFT ADRENAL MASS MEDICATIONS: None. ANESTHESIA/SEDATION: Moderate (conscious) sedation was employed during this procedure. A total of Versed 0.5 mg and Fentanyl 75 mcg was administered intravenously. Moderate Sedation Time: 25 minutes. The patient's level of consciousness and vital signs were monitored continuously by radiology nursing throughout the procedure under my direct supervision. FLUOROSCOPY TIME:  None COMPLICATIONS: None immediate. PROCEDURE: Informed written consent was obtained from the patient after a thorough discussion of the procedural risks, benefits and alternatives. All questions were addressed. Maximal Sterile Barrier Technique was utilized including caps, mask, sterile gowns, sterile gloves, sterile drape, hand hygiene and skin antiseptic. A timeout was performed prior to the initiation of the procedure. Patient was placed in a left lateral decubitus position. Images through the upper abdomen were obtained. The left adrenal mass was identified. The back was prepped and draped in a sterile fashion. Skin and soft tissues anesthetized with 1% lidocaine. Using CT guidance, 17 gauge coaxial needle was directed into the left adrenal mass from a left paraspinal approach. Needle position confirmed within the posterior aspect of the lesion. Multiple core biopsies were performed with an 18 gauge device but scant bloody material was obtained. Therefore, 4 cc of blood was aspirated through the  17 gauge needle and collected for cytology. 17 gauge coaxial needle was removed without complication. Bandage placed over the puncture site. FINDINGS: Approximately 5.5 cm left adrenal mass. Needle position confirmed within the lesion. Bloody scant material was obtained from the core biopsies. Approximately 4 mL of blood was aspirated from the 17 gauge coaxial needle. Minimal blood around the adrenal lesion after the biopsy. IMPRESSION: CT-guided core biopsies and aspiration  from the left adrenal mass. Electronically Signed   By: Markus Daft M.D.   On: 07/09/2018 13:54    ASSESSMENT: Blake Hines is an 84 year old male with a history of a metastatic renal cell carcinoma who is managed by Dr. Julien Nordmann.  He has a PET scan planned for today.  He additionally will start with cycle #1 of Opdivo and Yervoy today.   PLAN: 1) A PET scan is pending. 2) Cycle 1 of Opdivo and Yervoy will be given today. 3) Blake Hines will return for labs only on 08/03/2018. 4) Blake Hines will return to see Dr. Julien Nordmann and for consideration of his next cycle of Opdivo and Yervoy on 08/17/2018. 5) This case was discussed with Dr. Julien Nordmann who agrees with this assessment and plan.  Lyndon Code  July 27, 2018, 5:48 PM

## 2018-07-27 NOTE — Telephone Encounter (Signed)
No los per 12/26 °

## 2018-07-30 ENCOUNTER — Telehealth: Payer: Self-pay | Admitting: *Deleted

## 2018-07-30 NOTE — Telephone Encounter (Signed)
-----   Message from Levada Schilling, RN sent at 07/26/2018  3:29 PM EST ----- Regarding: Blake Hines 1st tx f/u call Please call for f/u of 1st time treatment.

## 2018-07-30 NOTE — Telephone Encounter (Signed)
TCT patient to follow up with him after his 1st time treatment with Yervoy and Opdivo.  Spoke with patient. He states he feels well, no c/o, in good spirits. Eating and drinking well.  He is aware of upcoming appts and knows to call with any questions or concerns.

## 2018-08-03 ENCOUNTER — Inpatient Hospital Stay (HOSPITAL_BASED_OUTPATIENT_CLINIC_OR_DEPARTMENT_OTHER): Payer: Medicare Other | Admitting: Medical

## 2018-08-03 ENCOUNTER — Ambulatory Visit (HOSPITAL_COMMUNITY)
Admission: RE | Admit: 2018-08-03 | Discharge: 2018-08-03 | Disposition: A | Discharge status: Home / Self Care | Payer: Medicare Other | Source: Ambulatory Visit | Attending: Medical | Admitting: Medical

## 2018-08-03 ENCOUNTER — Inpatient Hospital Stay: Payer: Medicare Other | Attending: Internal Medicine

## 2018-08-03 VITALS — BP 138/80 | HR 129 | Temp 98.7°F | Resp 22

## 2018-08-03 DIAGNOSIS — D72823 Leukemoid reaction: Secondary | ICD-10-CM | POA: Insufficient documentation

## 2018-08-03 DIAGNOSIS — Z5112 Encounter for antineoplastic immunotherapy: Secondary | ICD-10-CM | POA: Diagnosis not present

## 2018-08-03 DIAGNOSIS — R509 Fever, unspecified: Secondary | ICD-10-CM | POA: Insufficient documentation

## 2018-08-03 DIAGNOSIS — R918 Other nonspecific abnormal finding of lung field: Secondary | ICD-10-CM | POA: Diagnosis not present

## 2018-08-03 DIAGNOSIS — C649 Malignant neoplasm of unspecified kidney, except renal pelvis: Secondary | ICD-10-CM | POA: Diagnosis not present

## 2018-08-03 DIAGNOSIS — R5382 Chronic fatigue, unspecified: Secondary | ICD-10-CM

## 2018-08-03 DIAGNOSIS — Z794 Long term (current) use of insulin: Secondary | ICD-10-CM | POA: Insufficient documentation

## 2018-08-03 DIAGNOSIS — C7802 Secondary malignant neoplasm of left lung: Secondary | ICD-10-CM

## 2018-08-03 DIAGNOSIS — C7801 Secondary malignant neoplasm of right lung: Secondary | ICD-10-CM | POA: Diagnosis not present

## 2018-08-03 DIAGNOSIS — E119 Type 2 diabetes mellitus without complications: Secondary | ICD-10-CM | POA: Diagnosis not present

## 2018-08-03 DIAGNOSIS — C7972 Secondary malignant neoplasm of left adrenal gland: Secondary | ICD-10-CM | POA: Diagnosis not present

## 2018-08-03 DIAGNOSIS — Z87891 Personal history of nicotine dependence: Secondary | ICD-10-CM

## 2018-08-03 DIAGNOSIS — R042 Hemoptysis: Secondary | ICD-10-CM | POA: Diagnosis not present

## 2018-08-03 DIAGNOSIS — Z79899 Other long term (current) drug therapy: Secondary | ICD-10-CM | POA: Insufficient documentation

## 2018-08-03 LAB — CMP (CANCER CENTER ONLY)
ALT: 63 U/L — ABNORMAL HIGH (ref 0–44)
AST: 38 U/L (ref 15–41)
Albumin: 3.7 g/dL (ref 3.5–5.0)
Alkaline Phosphatase: 61 U/L (ref 38–126)
Anion gap: 9 (ref 5–15)
BUN: 26 mg/dL — AB (ref 8–23)
CO2: 24 mmol/L (ref 22–32)
Calcium: 10.3 mg/dL (ref 8.9–10.3)
Chloride: 88 mmol/L — ABNORMAL LOW (ref 98–111)
Creatinine: 1.88 mg/dL — ABNORMAL HIGH (ref 0.61–1.24)
GFR, Est AFR Am: 37 mL/min — ABNORMAL LOW (ref >60)
GFR, Estimated: 32 mL/min — ABNORMAL LOW (ref >60)
Glucose, Bld: 319 mg/dL — ABNORMAL HIGH (ref 70–99)
Potassium: 4.4 mmol/L (ref 3.5–5.1)
Sodium: 121 mmol/L — ABNORMAL LOW (ref 135–145)
Total Bilirubin: 1.7 mg/dL — ABNORMAL HIGH (ref 0.3–1.2)
Total Protein: 7.7 g/dL (ref 6.5–8.1)

## 2018-08-03 LAB — TSH: TSH: 3.615 u[IU]/mL (ref 0.320–4.118)

## 2018-08-03 LAB — CBC WITH DIFFERENTIAL (CANCER CENTER ONLY)
Abs Immature Granulocytes: 0.08 10*3/uL — ABNORMAL HIGH (ref 0.00–0.07)
BASOS ABS: 0 10*3/uL (ref 0.0–0.1)
Basophils Relative: 0 %
EOS PCT: 3 %
Eosinophils Absolute: 0.4 10*3/uL (ref 0.0–0.5)
HCT: 43.5 % (ref 39.0–52.0)
Hemoglobin: 15.2 g/dL (ref 13.0–17.0)
Immature Granulocytes: 1 %
Lymphocytes Relative: 7 %
Lymphs Abs: 0.9 10*3/uL (ref 0.7–4.0)
MCH: 29 pg (ref 26.0–34.0)
MCHC: 34.9 g/dL (ref 30.0–36.0)
MCV: 82.9 fL (ref 80.0–100.0)
MONO ABS: 1 10*3/uL (ref 0.1–1.0)
Monocytes Relative: 8 %
NRBC: 0 % (ref 0.0–0.2)
Neutro Abs: 10.1 10*3/uL — ABNORMAL HIGH (ref 1.7–7.7)
Neutrophils Relative %: 81 %
Platelet Count: 215 10*3/uL (ref 150–400)
RBC: 5.25 MIL/uL (ref 4.22–5.81)
RDW: 12 % (ref 11.5–15.5)
WBC: 12.4 10*3/uL — AB (ref 4.0–10.5)

## 2018-08-03 MED ORDER — DOXYCYCLINE HYCLATE 100 MG PO TABS: 100.0000 mg | ORAL_TABLET | Freq: Two times a day (BID) | ORAL | 0 refills | Status: DC

## 2018-08-03 NOTE — Progress Notes (Signed)
These results were called to Dimas Chyle and were reviewed with him . His were answered. He expressed understanding.

## 2018-08-03 NOTE — Progress Notes (Signed)
Pt presents today from the lab with c/o coughing up blood.  Pt coughing during assessment, thin bloody sputum visible.  Denies CP or SOB.  Denies N/V/D or changes in bowels/bladder.  Denies dark stools.  Reports intermittent fever over 101 and chills over last few days.  Denies fatigue or weakness or dizziness.  Denies recent injury or falls.  Denies nosebleeds or excessive bruising/bleeding or changes in skin.  A&Ox4.  Ambulatory w/steady gait.  DOE but speaks in full sentences.  Currently afebrile.  Pt sent to radiology for CXR, ambulatory w/belongings.  Pt to go home after and start oral abx ordered by PA Lucianne Lei.  Pt verbalized understanding and knows to call with worsening symptoms.  Will receive call from [facility] about CXR results.

## 2018-08-03 NOTE — Patient Instructions (Signed)
Chest X-Ray  A chest X-ray is a painless test that uses radiation to create images of the structures inside of your chest. Chest X-rays are used to look for many health conditions, including heart failure, pneumonia, tuberculosis, rib fractures, breathing disorders, and cancer. They may be used to diagnose chest pain, constant coughing, or trouble breathing. Tell a health care provider about:  Any allergies you have.  All medicines you are taking, including vitamins, herbs, eye drops, creams, and over-the-counter medicines.  Any surgeries you have had.  Any medical conditions you have.  Whether you are pregnant or may be pregnant. What are the risks? Getting a chest X-ray is a safe procedure. However, you will be exposed to a small amount of radiation. Being exposed to too much radiation over a lifetime can increase the risk of cancer. This risk is small, but it may occur if you have many X-rays throughout your life. What happens before the procedure?  You may be asked to remove glasses, jewelry, and any other metal objects.  You will be asked to undress from the waist up. You may be given a hospital gown to wear.  You may be asked to wear a protective lead apron to protect parts of your body from radiation. What happens during the procedure?  You will be asked to stand still as each picture is taken to get the best possible images.  You will be asked to take a deep breath and hold your breath for a few seconds.  The X-ray machine will create a picture of your chest using a tiny burst of radiation. This is painless.  More pictures may be taken from other angles. Typically, one picture will be taken while you face the X-ray camera, and another picture will be taken from the side while you stand. If you cannot stand, you may be asked to lie down. The procedure may vary among health care providers and hospitals. What happens after the procedure?  The X-ray(s) will be reviewed by your  health care provider or an X-ray (radiology) specialist.  It is up to you to get your test results. Ask your health care provider, or the department that is doing the test, when your results will be ready.  Your health care provider will tell you if you need more tests or a follow-up exam. Keep all follow-up visits as told by your health care provider. This is important. Summary  A chest X-ray is a safe, painless test that is used to examine the inside of the chest, heart, and lungs.  You will need to undress from the waist up and remove jewelry and metal objects before the procedure.  You will be exposed to a small amount of radiation during the procedure.  The X-ray machine will take one or more pictures of your chest while you remain as still as possible.  Later, a health care provider or specialist will review the test results with you. This information is not intended to replace advice given to you by your health care provider. Make sure you discuss any questions you have with your health care provider. Document Released: 09/13/2016 Document Revised: 09/13/2016 Document Reviewed: 09/13/2016 Elsevier Interactive Patient Education  2019 [employer]  

## 2018-08-07 NOTE — Progress Notes (Signed)
[facility] Progress Note   Blake Hines [id number] February 17, 1931 85 y.o.  Blake Hines is managed by Dr. Julien Nordmann  Actively treated with chemotherapy/immunotherapy/hormonal therapy: yes  Current Therapy: Opdivo and Yervoy  Last Treated: 07/26/2018 (cycle 1, day 1)  Assessment: Plan:    Hemoptysis - Plan: DG Chest 2 View, doxycycline (VIBRA-TABS) 100 MG tablet  Fever, unspecified fever cause - Plan: DG Chest 2 View, doxycycline (VIBRA-TABS) 100 MG tablet  Leukemoid reaction - Plan: DG Chest 2 View, doxycycline (VIBRA-TABS) 100 MG tablet   Hemoptysis: A CBC was completed today which showed a hemoglobin of 15.2 and a hematocrit at 43.5.  This was slightly lower than 8 days ago when his hemoglobin was 16 and his hematocrit was 46.5.  The patient has been reassured and has been asked to return or present to the emergency room should hemoptysis worsen.  Fever and leukemoid reaction.  The patient reports that he has had temperatures to around 100.4 episodically since last week.  A CBC returned today with a WBC of 12.4 and an ANC of 10.1.  He was referred for a chest x-ray returned as follows:  1.  No active cardiopulmonary disease. 2.  Grossly unchanged subcarinal lymphadenopathy and right lower lobe pulmonary metastasis. Other pulmonary nodules seen on recent CT are not well visualized by x-ray.  He was given a prescription for doxycycline 100 mg p.o. twice daily x7 days.  Please see After Visit Summary for patient specific instructions.  Future Appointments  Date Time Provider [place]  08/10/2018 10:00 AM [facility] PET CT 1 [facility] [place]  08/10/2018 11:00 AM [facility] LAB 3 [facility] None  08/17/2018 10:00 AM [facility] LAB 4 [facility] None  08/17/2018 10:30 AM Curt Bears, MD [facility] None  08/17/2018 11:15 AM [facility] INFUSION [facility] None  08/24/2018 11:00 AM [facility] LAB 6 [facility] None  08/31/2018 11:00 AM [facility] LAB 1  [facility] None    Orders Placed This Encounter  Procedures  . DG Chest 2 View       Subjective:   Patient ID:  Blake Hines is a 85 y.o. (DOB February 15, 1931) male.  Chief Complaint:  Chief Complaint  Patient presents with  . Hemoptysis    HPI Blake Hines is an 85 year old male with a history of a metastatic recurrent renal cell carcinoma who is managed by Dr. Julien Nordmann and is currently treated with Armida Sans and Yervoy with cycle 1, day 1 dosed on 07/26/2018.  He presents to the clinic today as a walk-in patient after having an episode of hemoptysis today.  He had his first episode of hemoptysis and mid October and then again in early November.  He coughed up a small amount of blood today which was thin.  He is on no blood thinners.  He had labs completed today which showed a platelet count of 215 and a hemoglobin of 15.2 with a hematocrit of 43.5.  He has had no other evidence of bleeding.  He did report having a low-grade temperature of 100.4 with chills last week.  He took several doses of Tylenol.  He denies appetite axis, fatigue, dizziness, weakness, chest pain, shortness of breath, nausea, vomiting, diarrhea, melena, or bright red blood per rectum.  A CBC today returned with a WBC of 12.4 and an ANC of 10.1.  Medications: I have reviewed the patient's current medications.  Allergies: No Known Allergies  Past Medical History:  Diagnosis Date  . Bipolar 1 disorder ([place])   . BPH (  benign prostatic hyperplasia)   . Cancer ([place])    RCC  . Cataract   . Chronic kidney disease   . COPD (chronic obstructive pulmonary disease) ([place])   . Depression   . Diabetes mellitus without complication ([place])   . Hypertension     No past surgical history on file.  No family history on file.  Social History   Socioeconomic History  . Marital status: Widowed    Spouse name: Not on file  . Number of children: Not on file  . Years of education: Not on file  . Highest education level: Not on  file  Occupational History  . Not on file  Social Needs  . Financial resource strain: Not on file  . Food insecurity:    Worry: Not on file    Inability: Not on file  . Transportation needs:    Medical: Not on file    Non-medical: Not on file  Tobacco Use  . Smoking status: Former Smoker    Packs/day: 0.50    Years: 40.00    Pack years: 20.00    Types: Cigarettes    Last attempt to quit: 07/13/2003    Years since quitting: 15.0  . Smokeless tobacco: Never Used  Substance and Sexual Activity  . Alcohol use: Yes    Comment: 50 cc wild [place] daily  . Drug use: Not on file  . Sexual activity: Not on file  Lifestyle  . Physical activity:    Days per week: Not on file    Minutes per session: Not on file  . Stress: Not on file  Relationships  . Social connections:    Talks on phone: Not on file    Gets together: Not on file    Attends religious service: Not on file    Active member of club or organization: Not on file    Attends meetings of clubs or organizations: Not on file    Relationship status: Not on file  . Intimate partner violence:    Fear of current or ex partner: Not on file    Emotionally abused: Not on file    Physically abused: Not on file    Forced sexual activity: Not on file  Other Topics Concern  . Not on file  Social History Narrative  . Not on file    Past Medical History, Surgical history, Social history, and Family history were reviewed and updated as appropriate.   Please see review of systems for further details on the patient's review from today.   Review of Systems:  Review of Systems  Constitutional: Positive for fever. Negative for chills, diaphoresis and fatigue.  HENT: Negative for congestion, postnasal drip, rhinorrhea and sore throat.   Respiratory: Positive for cough. Negative for shortness of breath and wheezing.        Hemoptysis  Cardiovascular: Negative for palpitations.  Gastrointestinal: Negative for anal bleeding and blood  in stool.  Genitourinary: Negative for hematuria.  Neurological: Negative for dizziness, weakness, light-headedness and headaches.    Objective:   Physical Exam:  BP 138/80 (BP Location: Left Arm, Patient Position: Sitting) Comment: manual  Pulse (!) 129   Temp 98.7 F (37.1 C) (Oral)   Resp (!) 22   SpO2 97%  ECOG: 0  Manual Pulse: 84  Physical Exam Constitutional:      General: He is not in acute distress.    Appearance: He is not diaphoretic.  HENT:     Head: Normocephalic and atraumatic.  Mouth/Throat:     Pharynx: No oropharyngeal exudate.  Cardiovascular:     Rate and Rhythm: Normal rate and regular rhythm.     Heart sounds: Normal heart sounds. No murmur. No friction rub. No gallop.   Pulmonary:     Effort: Pulmonary effort is normal. No respiratory distress.     Breath sounds: Normal breath sounds. No wheezing or rales.  Skin:    General: Skin is warm and dry.     Findings: No erythema or rash.  Neurological:     Mental Status: He is alert.     Coordination: Coordination normal.  Psychiatric:        Behavior: Behavior normal.        Thought Content: Thought content normal.        Judgment: Judgment normal.     Lab Review:     Component Value Date/Time   NA 121 (L) 08/03/2018 1057   K 4.4 08/03/2018 1057   CL 88 (L) 08/03/2018 1057   CO2 24 08/03/2018 1057   GLUCOSE 319 (H) 08/03/2018 1057   BUN 26 (H) 08/03/2018 1057   CREATININE 1.88 (H) 08/03/2018 1057   CALCIUM 10.3 08/03/2018 1057   PROT 7.7 08/03/2018 1057   ALBUMIN 3.7 08/03/2018 1057   AST 38 08/03/2018 1057   ALT 63 (H) 08/03/2018 1057   ALKPHOS 61 08/03/2018 1057   BILITOT 1.7 (H) 08/03/2018 1057   GFRNONAA 32 (L) 08/03/2018 1057   GFRAA 37 (L) 08/03/2018 1057       Component Value Date/Time   WBC 12.4 (H) 08/03/2018 1057   WBC 8.5 07/09/2018 0839   RBC 5.25 08/03/2018 1057   HGB 15.2 08/03/2018 1057   HCT 43.5 08/03/2018 1057   PLT 215 08/03/2018 1057   MCV 82.9 08/03/2018  1057   MCH 29.0 08/03/2018 1057   MCHC 34.9 08/03/2018 1057   RDW 12.0 08/03/2018 1057   LYMPHSABS 0.9 08/03/2018 1057   MONOABS 1.0 08/03/2018 1057   EOSABS 0.4 08/03/2018 1057   BASOSABS 0.0 08/03/2018 1057   -------------------------------  Imaging from last 24 hours (if applicable):  Radiology interpretation: Dg Chest 2 View  Result Date: 08/03/2018 CLINICAL DATA:  Fever and hemoptysis. EXAM: CHEST - 2 VIEW COMPARISON:  CT chest dated June 26, 2018. FINDINGS: The heart size and mediastinal contours are within normal limits. Normal pulmonary vascularity. Stable calcified granuloma in the right lung. Grossly unchanged pulmonary nodule in the medial superior segment of the right lower lobe. Other previously seen pulmonary nodules on recent chest CT are not well visualized on x-ray. No focal consolidation, pleural effusion, or pneumothorax. Grossly unchanged subcarinal nodal metastasis. No acute osseous abnormality. IMPRESSION: 1.  No active cardiopulmonary disease. 2. Grossly unchanged subcarinal lymphadenopathy and right lower lobe pulmonary metastasis. Other pulmonary nodules seen on recent CT are not well visualized by x-ray. Electronically Signed   By: Titus Dubin M.D.   On: 08/03/2018 12:49   Mr Jeri Cos OZ Contrast  Result Date: 07/23/2018 CLINICAL DATA:  Lung mass. Kidney cancer. Staging for metastatic disease. EXAM: MRI HEAD WITHOUT AND WITH CONTRAST TECHNIQUE: Multiplanar, multiecho pulse sequences of the brain and surrounding structures were obtained without and with intravenous contrast. CONTRAST:  8 mL Gadovist IV COMPARISON:  None. FINDINGS: Brain: Generalized atrophy. Patchy white matter hyperintensities bilaterally consistent with chronic ischemia. Small hyperintensity in the mid pons. Negative for acute infarct. Negative for hemorrhage or mass Normal enhancement.  No enhancing metastatic deposits in the brain. Vascular: Normal  arterial flow voids.  Normal venous  enhancement. Skull and upper cervical spine: No skull lesions. Sinuses/Orbits: Mild mucosal edema paranasal sinuses and left mastoid sinus. Large retention cyst left maxillary sinus. Bilateral cataract surgery Other: None IMPRESSION: Negative for metastatic disease to the brain.  No acute abnormality. Electronically Signed   By: Franchot Gallo M.D.   On: 07/23/2018 11:37   Ct Biopsy  Result Date: 07/09/2018 INDICATION: 85 year old with lung lesions and a large left adrenal mass. Findings are concerning for metastatic disease and possibly a lung primary. Patient also has history of renal cell carcinoma and left nephrectomy. EXAM: CT-GUIDED BIOPSY OF LEFT ADRENAL MASS MEDICATIONS: None. ANESTHESIA/SEDATION: Moderate (conscious) sedation was employed during this procedure. A total of Versed 0.5 mg and Fentanyl 75 mcg was administered intravenously. Moderate Sedation Time: 25 minutes. The patient's level of consciousness and vital signs were monitored continuously by radiology nursing throughout the procedure under my direct supervision. FLUOROSCOPY TIME:  None COMPLICATIONS: None immediate. PROCEDURE: Informed written consent was obtained from the patient after a thorough discussion of the procedural risks, benefits and alternatives. All questions were addressed. Maximal Sterile Barrier Technique was utilized including caps, mask, sterile gowns, sterile gloves, sterile drape, hand hygiene and skin antiseptic. A timeout was performed prior to the initiation of the procedure. Patient was placed in a left lateral decubitus position. Images through the upper abdomen were obtained. The left adrenal mass was identified. The back was prepped and draped in a sterile fashion. Skin and soft tissues anesthetized with 1% lidocaine. Using CT guidance, 17 gauge coaxial needle was directed into the left adrenal mass from a left paraspinal approach. Needle position confirmed within the posterior aspect of the lesion. Multiple core  biopsies were performed with an 18 gauge device but scant bloody material was obtained. Therefore, 4 cc of blood was aspirated through the 17 gauge needle and collected for cytology. 17 gauge coaxial needle was removed without complication. Bandage placed over the puncture site. FINDINGS: Approximately 5.5 cm left adrenal mass. Needle position confirmed within the lesion. Bloody scant material was obtained from the core biopsies. Approximately 4 mL of blood was aspirated from the 17 gauge coaxial needle. Minimal blood around the adrenal lesion after the biopsy. IMPRESSION: CT-guided core biopsies and aspiration from the left adrenal mass. Electronically Signed   By: Markus Daft M.D.   On: 07/09/2018 13:54        This case was discussed with Dr. Julien Nordmann. He expressed agreement with my management of this patient.

## 2018-08-10 ENCOUNTER — Encounter (HOSPITAL_COMMUNITY)
Admission: RE | Admit: 2018-08-10 | Discharge: 2018-08-10 | Disposition: A | Discharge status: Home / Self Care | Payer: Medicare Other | Source: Ambulatory Visit | Attending: Internal Medicine | Admitting: Internal Medicine

## 2018-08-10 ENCOUNTER — Telehealth: Payer: Self-pay | Admitting: Internal Medicine

## 2018-08-10 ENCOUNTER — Inpatient Hospital Stay: Payer: Medicare Other

## 2018-08-10 DIAGNOSIS — Z5112 Encounter for antineoplastic immunotherapy: Secondary | ICD-10-CM | POA: Diagnosis not present

## 2018-08-10 DIAGNOSIS — C7801 Secondary malignant neoplasm of right lung: Secondary | ICD-10-CM | POA: Diagnosis not present

## 2018-08-10 DIAGNOSIS — R918 Other nonspecific abnormal finding of lung field: Secondary | ICD-10-CM | POA: Diagnosis not present

## 2018-08-10 DIAGNOSIS — C7972 Secondary malignant neoplasm of left adrenal gland: Secondary | ICD-10-CM | POA: Diagnosis not present

## 2018-08-10 DIAGNOSIS — E119 Type 2 diabetes mellitus without complications: Secondary | ICD-10-CM | POA: Diagnosis not present

## 2018-08-10 DIAGNOSIS — C7802 Secondary malignant neoplasm of left lung: Secondary | ICD-10-CM | POA: Diagnosis not present

## 2018-08-10 DIAGNOSIS — C649 Malignant neoplasm of unspecified kidney, except renal pelvis: Secondary | ICD-10-CM | POA: Diagnosis not present

## 2018-08-10 LAB — CBC WITH DIFFERENTIAL (CANCER CENTER ONLY)
Abs Immature Granulocytes: 0.14 10*3/uL — ABNORMAL HIGH (ref 0.00–0.07)
Basophils Absolute: 0.1 10*3/uL (ref 0.0–0.1)
Basophils Relative: 1 %
Eosinophils Absolute: 0.4 10*3/uL (ref 0.0–0.5)
Eosinophils Relative: 4 %
HEMATOCRIT: 43.4 % (ref 39.0–52.0)
HEMOGLOBIN: 14.9 g/dL (ref 13.0–17.0)
Immature Granulocytes: 1 %
LYMPHS PCT: 13 %
Lymphs Abs: 1.3 10*3/uL (ref 0.7–4.0)
MCH: 28.6 pg (ref 26.0–34.0)
MCHC: 34.3 g/dL (ref 30.0–36.0)
MCV: 83.3 fL (ref 80.0–100.0)
Monocytes Absolute: 0.8 10*3/uL (ref 0.1–1.0)
Monocytes Relative: 8 %
Neutro Abs: 7.6 10*3/uL (ref 1.7–7.7)
Neutrophils Relative %: 73 %
Platelet Count: 312 10*3/uL (ref 150–400)
RBC: 5.21 MIL/uL (ref 4.22–5.81)
RDW: 12 % (ref 11.5–15.5)
WBC Count: 10.4 10*3/uL (ref 4.0–10.5)
nRBC: 0 % (ref 0.0–0.2)

## 2018-08-10 LAB — CMP (CANCER CENTER ONLY)
ALT: 37 U/L (ref 0–44)
AST: 21 U/L (ref 15–41)
Albumin: 3.8 g/dL (ref 3.5–5.0)
Alkaline Phosphatase: 64 U/L (ref 38–126)
Anion gap: 8 (ref 5–15)
BUN: 25 mg/dL — ABNORMAL HIGH (ref 8–23)
CO2: 28 mmol/L (ref 22–32)
Calcium: 10.3 mg/dL (ref 8.9–10.3)
Chloride: 90 mmol/L — ABNORMAL LOW (ref 98–111)
Creatinine: 1.62 mg/dL — ABNORMAL HIGH (ref 0.61–1.24)
GFR, Est AFR Am: 44 mL/min — ABNORMAL LOW (ref >60)
GFR, Estimated: 38 mL/min — ABNORMAL LOW (ref >60)
GLUCOSE: 140 mg/dL — AB (ref 70–99)
Potassium: 4.8 mmol/L (ref 3.5–5.1)
Sodium: 126 mmol/L — ABNORMAL LOW (ref 135–145)
Total Bilirubin: 0.8 mg/dL (ref 0.3–1.2)
Total Protein: 7.7 g/dL (ref 6.5–8.1)

## 2018-08-10 LAB — GLUCOSE, CAPILLARY: Glucose-Capillary: 144 mg/dL — ABNORMAL HIGH (ref 70–99)

## 2018-08-10 MED ORDER — FLUDEOXYGLUCOSE F - 18 (FDG) INJECTION
9.1000 | Freq: Once | INTRAVENOUS | Status: AC | PRN
  Administered 2018-08-10: 9.1 via INTRAVENOUS

## 2018-08-10 NOTE — Telephone Encounter (Signed)
Per MM moved appointments for 1/17 to 1/16. Left message for patient and mailed schedule.

## 2018-08-16 ENCOUNTER — Inpatient Hospital Stay (HOSPITAL_BASED_OUTPATIENT_CLINIC_OR_DEPARTMENT_OTHER): Payer: Medicare Other | Admitting: Internal Medicine

## 2018-08-16 ENCOUNTER — Inpatient Hospital Stay: Payer: Medicare Other

## 2018-08-16 ENCOUNTER — Encounter: Payer: Self-pay | Admitting: Internal Medicine

## 2018-08-16 ENCOUNTER — Telehealth: Payer: Self-pay | Admitting: Internal Medicine

## 2018-08-16 VITALS — BP 153/88 | HR 93 | Temp 97.5°F | Resp 17 | Ht 68.0 in | Wt 177.6 lb

## 2018-08-16 DIAGNOSIS — E119 Type 2 diabetes mellitus without complications: Secondary | ICD-10-CM | POA: Diagnosis not present

## 2018-08-16 DIAGNOSIS — C7802 Secondary malignant neoplasm of left lung: Principal | ICD-10-CM

## 2018-08-16 DIAGNOSIS — Z794 Long term (current) use of insulin: Secondary | ICD-10-CM

## 2018-08-16 DIAGNOSIS — Z87891 Personal history of nicotine dependence: Secondary | ICD-10-CM | POA: Diagnosis not present

## 2018-08-16 DIAGNOSIS — C7972 Secondary malignant neoplasm of left adrenal gland: Secondary | ICD-10-CM

## 2018-08-16 DIAGNOSIS — C649 Malignant neoplasm of unspecified kidney, except renal pelvis: Secondary | ICD-10-CM | POA: Diagnosis not present

## 2018-08-16 DIAGNOSIS — Z5112 Encounter for antineoplastic immunotherapy: Secondary | ICD-10-CM | POA: Diagnosis not present

## 2018-08-16 DIAGNOSIS — C7801 Secondary malignant neoplasm of right lung: Secondary | ICD-10-CM

## 2018-08-16 DIAGNOSIS — Z79899 Other long term (current) drug therapy: Secondary | ICD-10-CM | POA: Diagnosis not present

## 2018-08-16 LAB — CMP (CANCER CENTER ONLY)
ALK PHOS: 66 U/L (ref 38–126)
ALT: 26 U/L (ref 0–44)
AST: 21 U/L (ref 15–41)
Albumin: 3.9 g/dL (ref 3.5–5.0)
Anion gap: 9 (ref 5–15)
BUN: 26 mg/dL — ABNORMAL HIGH (ref 8–23)
CO2: 26 mmol/L (ref 22–32)
Calcium: 10.4 mg/dL — ABNORMAL HIGH (ref 8.9–10.3)
Chloride: 95 mmol/L — ABNORMAL LOW (ref 98–111)
Creatinine: 1.64 mg/dL — ABNORMAL HIGH (ref 0.61–1.24)
GFR, Est AFR Am: 44 mL/min — ABNORMAL LOW (ref >60)
GFR, Estimated: 38 mL/min — ABNORMAL LOW (ref >60)
Glucose, Bld: 181 mg/dL — ABNORMAL HIGH (ref 70–99)
Potassium: 4.3 mmol/L (ref 3.5–5.1)
Sodium: 130 mmol/L — ABNORMAL LOW (ref 135–145)
Total Bilirubin: 0.5 mg/dL (ref 0.3–1.2)
Total Protein: 7.5 g/dL (ref 6.5–8.1)

## 2018-08-16 LAB — CBC WITH DIFFERENTIAL (CANCER CENTER ONLY)
Abs Immature Granulocytes: 0.11 10*3/uL — ABNORMAL HIGH (ref 0.00–0.07)
Basophils Absolute: 0.1 10*3/uL (ref 0.0–0.1)
Basophils Relative: 1 %
EOS ABS: 0.4 10*3/uL (ref 0.0–0.5)
Eosinophils Relative: 4 %
HCT: 42.7 % (ref 39.0–52.0)
Hemoglobin: 14.3 g/dL (ref 13.0–17.0)
Immature Granulocytes: 1 %
LYMPHS ABS: 1.2 10*3/uL (ref 0.7–4.0)
Lymphocytes Relative: 13 %
MCH: 28.3 pg (ref 26.0–34.0)
MCHC: 33.5 g/dL (ref 30.0–36.0)
MCV: 84.4 fL (ref 80.0–100.0)
Monocytes Absolute: 0.6 10*3/uL (ref 0.1–1.0)
Monocytes Relative: 7 %
NRBC: 0 % (ref 0.0–0.2)
Neutro Abs: 6.5 10*3/uL (ref 1.7–7.7)
Neutrophils Relative %: 74 %
Platelet Count: 271 10*3/uL (ref 150–400)
RBC: 5.06 MIL/uL (ref 4.22–5.81)
RDW: 12.4 % (ref 11.5–15.5)
WBC Count: 8.8 10*3/uL (ref 4.0–10.5)

## 2018-08-16 MED ORDER — DIPHENHYDRAMINE HCL 50 MG/ML IJ SOLN
INTRAMUSCULAR | Status: AC
  Filled 2018-08-16: qty 1

## 2018-08-16 MED ORDER — SODIUM CHLORIDE 0.9 % IV SOLN
2.9300 mg/kg | Freq: Once | INTRAVENOUS | Status: AC
  Administered 2018-08-16: 240 mg via INTRAVENOUS
  Filled 2018-08-16: qty 24

## 2018-08-16 MED ORDER — SODIUM CHLORIDE 0.9 % IV SOLN
Freq: Once | INTRAVENOUS | Status: AC
  Administered 2018-08-16: 14:00:00 via INTRAVENOUS
  Filled 2018-08-16: qty 250

## 2018-08-16 MED ORDER — FAMOTIDINE IN NACL 20-0.9 MG/50ML-% IV SOLN
20.0000 mg | Freq: Once | INTRAVENOUS | Status: AC
  Administered 2018-08-16: 20 mg via INTRAVENOUS

## 2018-08-16 MED ORDER — DIPHENHYDRAMINE HCL 50 MG/ML IJ SOLN
25.0000 mg | Freq: Once | INTRAMUSCULAR | Status: AC
  Administered 2018-08-16: 25 mg via INTRAVENOUS

## 2018-08-16 MED ORDER — SODIUM CHLORIDE 0.9 % IV SOLN
1.0000 mg/kg | Freq: Once | INTRAVENOUS | Status: AC
  Administered 2018-08-16: 80 mg via INTRAVENOUS
  Filled 2018-08-16: qty 16

## 2018-08-16 MED ORDER — FAMOTIDINE IN NACL 20-0.9 MG/50ML-% IV SOLN
INTRAVENOUS | Status: AC
  Filled 2018-08-16: qty 50

## 2018-08-16 NOTE — Progress Notes (Signed)
Per Dr. Julien Nordmann, [place] to tx with creatinine levels today.

## 2018-08-16 NOTE — Progress Notes (Signed)
[place] [phone]   Fax:([phone]) [place], [place] [address]  DIAGNOSIS:  Metastatic renal cell carcinoma in December 2019 that was initially diagnosed as a stage Ia (T1 a, N0, M0) in August 2008 status post left nephrectomy.  He currently present with numerous bilateral pulmonary nodules in addition to large mediastinal lymphadenopathy and metastatic left adrenal mass.  PRIOR THERAPY: None.  CURRENT THERAPY: Palliative treatment with immunotherapy with a combination of ipilimumab 1 mg/KG and nivolumab 3 mg/KG every 3 weeks for 4 cycles followed by maintenance treatment with nivolumab if the patient has no evidence for disease progression after cycle #4.  He is status post 1 cycle of the combination treatment.  INTERVAL HISTORY: Blake Hines 85 y.o. male returns to the clinic today for follow-up visit.  Patient is feeling fine today with no concerning complaints.  He tolerated the first cycle of his treatment with ipilimumab and nivolumab fairly well except for low-grade fever and chills for around 4 days resolved spontaneously.  He denied having any chest pain, shortness of breath, cough or hemoptysis.  He denied having any current fever or chills.  He has no nausea, vomiting, diarrhea or constipation.  The patient is here today for evaluation before starting cycle #2.  MEDICAL HISTORY: Past Medical History:  Diagnosis Date  . Bipolar 1 disorder ([place])   . BPH (benign prostatic hyperplasia)   . Cancer ([place])    RCC  . Cataract   . Chronic kidney disease   . COPD (chronic obstructive pulmonary disease) ([place])   . Depression   . Diabetes mellitus without complication ([place])   . Hypertension     ALLERGIES:  has No Known Allergies.  MEDICATIONS:  Current Outpatient Medications  Medication Sig Dispense Refill  . ANORO ELLIPTA 62.5-25 MCG/INH AEPB     . cholecalciferol (VITAMIN D3) 25 MCG  (1000 UT) tablet Take 1,000 Units by mouth daily.    . Coenzyme Q10 (COQ10) 200 MG CAPS Take 1 capsule by mouth daily.    . fenofibrate (TRICOR) 48 MG tablet Take 48 mg by mouth daily.    Marland Kitchen FREESTYLE LITE test strip USE TO TEST BLOOD SUGAR TWICE DAILY (E11.29)  1  . hydrochlorothiazide (HYDRODIURIL) 25 MG tablet Take 25 mg by mouth daily.    . Insulin Glargine (TOUJEO SOLOSTAR [facility]) Inject 3 Units into the skin daily.    . insulin lispro (HUMALOG KWIKPEN) 100 UNIT/ML KwikPen Inject 3 Units into the skin every evening. With dinner    . latanoprost (XALATAN) 0.005 % ophthalmic solution Place 1 drop into both eyes at bedtime.    Marland Kitchen losartan (COZAAR) 100 MG tablet Take 100 mg by mouth daily.    . Multiple Vitamins-Minerals (MULTIVITAMIN WITH MINERALS) tablet Take 1 tablet by mouth daily.    Marland Kitchen OLANZapine (ZYPREXA) 2.5 MG tablet Take 2.5 mg by mouth at bedtime.    . sertraline (ZOLOFT) 50 MG tablet Take 50 mg by mouth daily.    . sitaGLIPtin (JANUVIA) 50 MG tablet Take 50 mg by mouth every morning.     No current facility-administered medications for this visit.     SURGICAL HISTORY: No past surgical history on file.  REVIEW OF SYSTEMS:  A comprehensive review of systems was negative except for: Constitutional: positive for fatigue   PHYSICAL EXAMINATION: General appearance: alert, cooperative, fatigued and no distress Head: Normocephalic, without obvious abnormality, atraumatic Neck: no  adenopathy, no JVD, supple, symmetrical, trachea midline and thyroid not enlarged, symmetric, no tenderness/mass/nodules Lymph nodes: Cervical, supraclavicular, and axillary nodes normal. Resp: clear to auscultation bilaterally Back: symmetric, no curvature. ROM normal. No CVA tenderness. Cardio: regular rate and rhythm, S1, S2 normal, no murmur, click, rub or gallop GI: soft, non-tender; bowel sounds normal; no masses,  no organomegaly Extremities: extremities normal, atraumatic, no cyanosis or edema  ECOG  PERFORMANCE STATUS: 1 - Symptomatic but completely ambulatory  Blood pressure (!) 153/88, pulse 93, temperature (!) 97.5 F (36.4 C), temperature source Oral, resp. rate 17, height 5\' 8"  (1.727 m), weight 177 lb 9.6 oz (80.6 kg), SpO2 97 %.  LABORATORY DATA: Lab Results  Component Value Date   WBC 8.8 08/16/2018   HGB 14.3 08/16/2018   HCT 42.7 08/16/2018   MCV 84.4 08/16/2018   PLT 271 08/16/2018      Chemistry      Component Value Date/Time   NA 126 (L) 08/10/2018 1125   K 4.8 08/10/2018 1125   CL 90 (L) 08/10/2018 1125   CO2 28 08/10/2018 1125   BUN 25 (H) 08/10/2018 1125   CREATININE 1.62 (H) 08/10/2018 1125      Component Value Date/Time   CALCIUM 10.3 08/10/2018 1125   ALKPHOS 64 08/10/2018 1125   AST 21 08/10/2018 1125   ALT 37 08/10/2018 1125   BILITOT 0.8 08/10/2018 1125       RADIOGRAPHIC STUDIES: Dg Chest 2 View  Result Date: 08/03/2018 CLINICAL DATA:  Fever and hemoptysis. EXAM: CHEST - 2 VIEW COMPARISON:  CT chest dated June 26, 2018. FINDINGS: The heart size and mediastinal contours are within normal limits. Normal pulmonary vascularity. Stable calcified granuloma in the right lung. Grossly unchanged pulmonary nodule in the medial superior segment of the right lower lobe. Other previously seen pulmonary nodules on recent chest CT are not well visualized on x-ray. No focal consolidation, pleural effusion, or pneumothorax. Grossly unchanged subcarinal nodal metastasis. No acute osseous abnormality. IMPRESSION: 1.  No active cardiopulmonary disease. 2. Grossly unchanged subcarinal lymphadenopathy and right lower lobe pulmonary metastasis. Other pulmonary nodules seen on recent CT are not well visualized by x-ray. Electronically Signed   By: Titus Dubin M.D.   On: 08/03/2018 12:49   Mr Jeri Cos HX Contrast  Result Date: 07/23/2018 CLINICAL DATA:  Lung mass. Kidney cancer. Staging for metastatic disease. EXAM: MRI HEAD WITHOUT AND WITH CONTRAST TECHNIQUE:  Multiplanar, multiecho pulse sequences of the brain and surrounding structures were obtained without and with intravenous contrast. CONTRAST:  8 mL Gadovist IV COMPARISON:  None. FINDINGS: Brain: Generalized atrophy. Patchy white matter hyperintensities bilaterally consistent with chronic ischemia. Small hyperintensity in the mid pons. Negative for acute infarct. Negative for hemorrhage or mass Normal enhancement.  No enhancing metastatic deposits in the brain. Vascular: Normal arterial flow voids.  Normal venous enhancement. Skull and upper cervical spine: No skull lesions. Sinuses/Orbits: Mild mucosal edema paranasal sinuses and left mastoid sinus. Large retention cyst left maxillary sinus. Bilateral cataract surgery Other: None IMPRESSION: Negative for metastatic disease to the brain.  No acute abnormality. Electronically Signed   By: Franchot Gallo M.D.   On: 07/23/2018 11:37   Nm Pet Image Initial (pi) Skull Base To Thigh  Result Date: 08/10/2018 CLINICAL DATA:  Initial treatment strategy for lung mass. History of stage IV renal cell carcinoma EXAM: NUCLEAR MEDICINE PET SKULL BASE TO THIGH TECHNIQUE: 9.1 mCi F-18 FDG was injected intravenously. Full-ring PET imaging was performed from the skull  base to thigh after the radiotracer. CT data was obtained and used for attenuation correction and anatomic localization. Fasting blood glucose: 144 mg/dl COMPARISON:  CT chest 06/26/2018 FINDINGS: Mediastinal blood pool activity: SUV max 3.11 NECK: No hypermetabolic lymph nodes in the neck. Incidental CT findings: none CHEST: No hypermetabolic axillary or supraclavicular lymph nodes. The lower left paratracheal lymph nodes measure up to 1 cm within SUV max of 3.2. On CT chest from 04/20/2011 this node measured 0.5 cm. Large subcarinal lymph node measures 4.2 cm and has an SUV max 5.18. This is new when compared with 04/20/2011. On 06/26/2018 this measured 4.4 cm. Scattered pulmonary nodules are identified within both  lungs. Index nodules include: -subpleural nodule within the anteromedial left lower lobe measures 2.9 cm and has an SUV max 3.45. On 06/26/2018 this nodule measured 3.1 cm. -subpleural nodule within the anteromedial left base posterior to the left ventricle measures 1.7 cm and has an SUV max 3.03. Previously this measured 1.8 cm. -the largest nodule is in the medial right lower lobe measuring 2 cm within SUV max 2.58. New from 04/20/2011. On 06/26/2018 this measured 2.3 mm. -the tubular configured nodule within the right upper lobe adjacent to airway thickening and obstruction the measures 1.9 cm and has an SUV max 3.1. most recently this measured 2.0 cm. Incidental CT findings: none ABDOMEN/PELVIS: No abnormal radiotracer activity within the liver, pancreas, or spleen. Normal right adrenal gland. Left adrenal gland mass measures 5.1 cm and is new from 04/20/2011. On 12/24/2017 this measured 5.3 cm. This has an SUV max 4.78. Normal right adrenal gland. No hypermetabolic lymph nodes within the abdomen or pelvis. Incidental CT findings: Calcified granulomas identified within the spleen. Status post left nephrectomy. Right kidney cysts are again noted. Incompletely characterized without IV contrast. Aortic atherosclerosis. Aneurysmal dilatation of the common iliac arteries noted. The right common iliac artery measures 3 cm in maximum diameter, image 155/4. SKELETON: Muscular uptake identified adjacent to the greater trochanter of the proximal left femur is identified. This may be postinflammatory or related to prior trauma. No suspicious hypermetabolic bone lesions. Incidental CT findings: none IMPRESSION: 1. Overall stable to slight improvement in metastatic disease within the chest and upper abdomen compared with 06/26/2018. No significant areas of progressive disease noted. 2. Multifocal pulmonary nodules are stable to mildly decreased in size in the interval. 3. Hypermetabolic subcarinal lymph node is mildly  decreased in size in the interval. No progressive adenopathy identified. 4. Left adrenal gland metastasis is slightly decreased in size in the interval. 5.  Aortic Atherosclerosis (ICD10-I70.0). Electronically Signed   By: Kerby Moors M.D.   On: 08/10/2018 12:51    ASSESSMENT AND PLAN: This is a very pleasant 85 years old white male with metastatic renal cell carcinoma.  The patient is currently undergoing treatment with immunotherapy with ipilimumab and nivolumab status post 1 cycle.  He tolerated the first cycle of his treatment well except for few days of low-grade fever and chills which resolved spontaneously. I recommended for the patient to proceed with cycle #2 today as scheduled. He will come back for follow-up visit in 3 weeks for evaluation with the start of cycle #3. The patient was advised to call immediately if he has any concerning symptoms in the interval. The patient voices understanding of current disease status and treatment options and is in agreement with the current care plan.  All questions were answered. The patient knows to call the clinic with any problems, questions or concerns. We  can certainly see the patient much sooner if necessary.  I spent 10 minutes counseling the patient face to face. The total time spent in the appointment was 15 minutes.  Disclaimer: This note was dictated with voice recognition software. Similar sounding words can inadvertently be transcribed and may not be corrected upon review.

## 2018-08-16 NOTE — Patient Instructions (Signed)
Blake Hines Discharge Instructions for Patients Receiving Chemotherapy  Today you received the following chemotherapy agents: Blake Hines.  To help prevent nausea and vomiting after your treatment, we encourage you to take your nausea medication as prescribed.   If you develop nausea and vomiting that is not controlled by your nausea medication, call the clinic.   BELOW ARE SYMPTOMS THAT SHOULD BE REPORTED IMMEDIATELY:  *FEVER GREATER THAN 100.5 F  *CHILLS WITH OR WITHOUT FEVER  NAUSEA AND VOMITING THAT IS NOT CONTROLLED WITH YOUR NAUSEA MEDICATION  *UNUSUAL SHORTNESS OF BREATH  *UNUSUAL BRUISING OR BLEEDING  TENDERNESS IN MOUTH AND THROAT WITH OR WITHOUT PRESENCE OF ULCERS  *URINARY PROBLEMS  *BOWEL PROBLEMS  UNUSUAL RASH Items with * indicate a potential emergency and should be followed up as soon as possible.  Feel free to call the clinic should you have any questions or concerns. The clinic phone number is [phone].  Please show the [facility] at check-in to the Emergency Department and triage nurse.

## 2018-08-16 NOTE — Telephone Encounter (Signed)
Scheduled appt per 1/16 los - pt to get an updated schedule in the treatment area.  

## 2018-08-17 ENCOUNTER — Other Ambulatory Visit: Payer: Medicare Other

## 2018-08-17 ENCOUNTER — Ambulatory Visit: Payer: Medicare Other

## 2018-08-17 ENCOUNTER — Ambulatory Visit: Payer: Medicare Other | Admitting: Internal Medicine

## 2018-08-24 ENCOUNTER — Inpatient Hospital Stay: Payer: Medicare Other

## 2018-08-24 DIAGNOSIS — C7802 Secondary malignant neoplasm of left lung: Secondary | ICD-10-CM | POA: Diagnosis not present

## 2018-08-24 DIAGNOSIS — C649 Malignant neoplasm of unspecified kidney, except renal pelvis: Secondary | ICD-10-CM | POA: Diagnosis not present

## 2018-08-24 DIAGNOSIS — C7972 Secondary malignant neoplasm of left adrenal gland: Secondary | ICD-10-CM | POA: Diagnosis not present

## 2018-08-24 DIAGNOSIS — C7801 Secondary malignant neoplasm of right lung: Secondary | ICD-10-CM | POA: Diagnosis not present

## 2018-08-24 DIAGNOSIS — E119 Type 2 diabetes mellitus without complications: Secondary | ICD-10-CM | POA: Diagnosis not present

## 2018-08-24 DIAGNOSIS — Z5112 Encounter for antineoplastic immunotherapy: Secondary | ICD-10-CM | POA: Diagnosis not present

## 2018-08-24 LAB — CBC WITH DIFFERENTIAL (CANCER CENTER ONLY)
ABS IMMATURE GRANULOCYTES: 0.1 10*3/uL — AB (ref 0.00–0.07)
BASOS PCT: 1 %
Basophils Absolute: 0.1 10*3/uL (ref 0.0–0.1)
Eosinophils Absolute: 0.5 10*3/uL (ref 0.0–0.5)
Eosinophils Relative: 4 %
HCT: 43.2 % (ref 39.0–52.0)
Hemoglobin: 14.7 g/dL (ref 13.0–17.0)
Immature Granulocytes: 1 %
Lymphocytes Relative: 9 %
Lymphs Abs: 1.1 10*3/uL (ref 0.7–4.0)
MCH: 28.5 pg (ref 26.0–34.0)
MCHC: 34 g/dL (ref 30.0–36.0)
MCV: 83.9 fL (ref 80.0–100.0)
MONOS PCT: 9 %
Monocytes Absolute: 1.1 10*3/uL — ABNORMAL HIGH (ref 0.1–1.0)
Neutro Abs: 9.3 10*3/uL — ABNORMAL HIGH (ref 1.7–7.7)
Neutrophils Relative %: 76 %
Platelet Count: 289 10*3/uL (ref 150–400)
RBC: 5.15 MIL/uL (ref 4.22–5.81)
RDW: 12.6 % (ref 11.5–15.5)
WBC Count: 12.1 10*3/uL — ABNORMAL HIGH (ref 4.0–10.5)
nRBC: 0 % (ref 0.0–0.2)

## 2018-08-24 LAB — CMP (CANCER CENTER ONLY)
ALT: 33 U/L (ref 0–44)
AST: 23 U/L (ref 15–41)
Albumin: 3.6 g/dL (ref 3.5–5.0)
Alkaline Phosphatase: 61 U/L (ref 38–126)
Anion gap: 8 (ref 5–15)
BUN: 31 mg/dL — ABNORMAL HIGH (ref 8–23)
CO2: 27 mmol/L (ref 22–32)
CREATININE: 2.03 mg/dL — AB (ref 0.61–1.24)
Calcium: 10.5 mg/dL — ABNORMAL HIGH (ref 8.9–10.3)
Chloride: 90 mmol/L — ABNORMAL LOW (ref 98–111)
GFR, Est AFR Am: 34 mL/min — ABNORMAL LOW (ref >60)
GFR, Estimated: 29 mL/min — ABNORMAL LOW (ref >60)
Glucose, Bld: 279 mg/dL — ABNORMAL HIGH (ref 70–99)
Potassium: 4.7 mmol/L (ref 3.5–5.1)
Sodium: 125 mmol/L — ABNORMAL LOW (ref 135–145)
Total Bilirubin: 1.5 mg/dL — ABNORMAL HIGH (ref 0.3–1.2)
Total Protein: 7.6 g/dL (ref 6.5–8.1)

## 2018-08-31 ENCOUNTER — Inpatient Hospital Stay: Payer: Medicare Other

## 2018-08-31 ENCOUNTER — Telehealth: Payer: Self-pay | Admitting: *Deleted

## 2018-08-31 DIAGNOSIS — C7802 Secondary malignant neoplasm of left lung: Secondary | ICD-10-CM | POA: Diagnosis not present

## 2018-08-31 DIAGNOSIS — C649 Malignant neoplasm of unspecified kidney, except renal pelvis: Secondary | ICD-10-CM | POA: Diagnosis not present

## 2018-08-31 DIAGNOSIS — R5382 Chronic fatigue, unspecified: Secondary | ICD-10-CM

## 2018-08-31 DIAGNOSIS — Z5112 Encounter for antineoplastic immunotherapy: Secondary | ICD-10-CM | POA: Diagnosis not present

## 2018-08-31 DIAGNOSIS — E119 Type 2 diabetes mellitus without complications: Secondary | ICD-10-CM | POA: Diagnosis not present

## 2018-08-31 DIAGNOSIS — C7801 Secondary malignant neoplasm of right lung: Secondary | ICD-10-CM | POA: Diagnosis not present

## 2018-08-31 DIAGNOSIS — C7972 Secondary malignant neoplasm of left adrenal gland: Secondary | ICD-10-CM | POA: Diagnosis not present

## 2018-08-31 LAB — CMP (CANCER CENTER ONLY)
ALT: 306 U/L (ref 0–44)
AST: 137 U/L — ABNORMAL HIGH (ref 15–41)
Albumin: 2.9 g/dL — ABNORMAL LOW (ref 3.5–5.0)
Alkaline Phosphatase: 88 U/L (ref 38–126)
Anion gap: 10 (ref 5–15)
BILIRUBIN TOTAL: 1 mg/dL (ref 0.3–1.2)
BUN: 31 mg/dL — ABNORMAL HIGH (ref 8–23)
CO2: 26 mmol/L (ref 22–32)
CREATININE: 1.91 mg/dL — AB (ref 0.61–1.24)
Calcium: 11.4 mg/dL — ABNORMAL HIGH (ref 8.9–10.3)
Chloride: 89 mmol/L — ABNORMAL LOW (ref 98–111)
GFR, Est AFR Am: 36 mL/min — ABNORMAL LOW (ref >60)
GFR, Estimated: 31 mL/min — ABNORMAL LOW (ref >60)
Glucose, Bld: 380 mg/dL — ABNORMAL HIGH (ref 70–99)
Potassium: 4.7 mmol/L (ref 3.5–5.1)
Sodium: 125 mmol/L — ABNORMAL LOW (ref 135–145)
TOTAL PROTEIN: 6.9 g/dL (ref 6.5–8.1)

## 2018-08-31 LAB — CBC WITH DIFFERENTIAL (CANCER CENTER ONLY)
Abs Immature Granulocytes: 0.06 10*3/uL (ref 0.00–0.07)
Basophils Absolute: 0.1 10*3/uL (ref 0.0–0.1)
Basophils Relative: 1 %
Eosinophils Absolute: 0.5 10*3/uL (ref 0.0–0.5)
Eosinophils Relative: 5 %
HCT: 41 % (ref 39.0–52.0)
Hemoglobin: 14.1 g/dL (ref 13.0–17.0)
Immature Granulocytes: 1 %
Lymphocytes Relative: 8 %
Lymphs Abs: 0.8 10*3/uL (ref 0.7–4.0)
MCH: 28.5 pg (ref 26.0–34.0)
MCHC: 34.4 g/dL (ref 30.0–36.0)
MCV: 82.8 fL (ref 80.0–100.0)
MONO ABS: 0.9 10*3/uL (ref 0.1–1.0)
Monocytes Relative: 9 %
Neutro Abs: 7.8 10*3/uL — ABNORMAL HIGH (ref 1.7–7.7)
Neutrophils Relative %: 76 %
Platelet Count: 304 10*3/uL (ref 150–400)
RBC: 4.95 MIL/uL (ref 4.22–5.81)
RDW: 12.3 % (ref 11.5–15.5)
WBC Count: 10.1 10*3/uL (ref 4.0–10.5)
nRBC: 0 % (ref 0.0–0.2)

## 2018-08-31 LAB — TSH: TSH: 2.385 u[IU]/mL (ref 0.320–4.118)

## 2018-08-31 NOTE — Telephone Encounter (Signed)
TC from lab with ALT result of 306.  In basket message sent to Dr. Julien Nordmann

## 2018-09-03 ENCOUNTER — Other Ambulatory Visit: Payer: Self-pay | Admitting: *Deleted

## 2018-09-03 DIAGNOSIS — C7802 Secondary malignant neoplasm of left lung: Principal | ICD-10-CM

## 2018-09-03 DIAGNOSIS — R82998 Other abnormal findings in urine: Secondary | ICD-10-CM | POA: Diagnosis not present

## 2018-09-03 DIAGNOSIS — Z125 Encounter for screening for malignant neoplasm of prostate: Secondary | ICD-10-CM | POA: Diagnosis not present

## 2018-09-03 DIAGNOSIS — C649 Malignant neoplasm of unspecified kidney, except renal pelvis: Secondary | ICD-10-CM

## 2018-09-03 DIAGNOSIS — E7849 Other hyperlipidemia: Secondary | ICD-10-CM | POA: Diagnosis not present

## 2018-09-03 DIAGNOSIS — N184 Chronic kidney disease, stage 4 (severe): Secondary | ICD-10-CM | POA: Diagnosis not present

## 2018-09-03 DIAGNOSIS — E1129 Type 2 diabetes mellitus with other diabetic kidney complication: Secondary | ICD-10-CM | POA: Diagnosis not present

## 2018-09-04 ENCOUNTER — Emergency Department (HOSPITAL_COMMUNITY): Payer: Medicare Other

## 2018-09-04 ENCOUNTER — Other Ambulatory Visit: Payer: Self-pay

## 2018-09-04 ENCOUNTER — Encounter (HOSPITAL_COMMUNITY): Payer: Self-pay

## 2018-09-04 ENCOUNTER — Inpatient Hospital Stay (HOSPITAL_COMMUNITY)
Admission: EM | Admit: 2018-09-04 | Discharge: 2018-09-15 | DRG: 643 | Disposition: A | Discharge status: Home Health | Payer: Medicare Other | Attending: Internal Medicine | Admitting: Internal Medicine

## 2018-09-04 DIAGNOSIS — R0902 Hypoxemia: Secondary | ICD-10-CM | POA: Diagnosis not present

## 2018-09-04 DIAGNOSIS — E222 Syndrome of inappropriate secretion of antidiuretic hormone: Principal | ICD-10-CM | POA: Diagnosis present

## 2018-09-04 DIAGNOSIS — R945 Abnormal results of liver function studies: Secondary | ICD-10-CM | POA: Diagnosis present

## 2018-09-04 DIAGNOSIS — R918 Other nonspecific abnormal finding of lung field: Secondary | ICD-10-CM | POA: Diagnosis not present

## 2018-09-04 DIAGNOSIS — N183 Chronic kidney disease, stage 3 (moderate): Secondary | ICD-10-CM | POA: Diagnosis not present

## 2018-09-04 DIAGNOSIS — I459 Conduction disorder, unspecified: Secondary | ICD-10-CM | POA: Diagnosis present

## 2018-09-04 DIAGNOSIS — J440 Chronic obstructive pulmonary disease with acute lower respiratory infection: Secondary | ICD-10-CM | POA: Diagnosis present

## 2018-09-04 DIAGNOSIS — F319 Bipolar disorder, unspecified: Secondary | ICD-10-CM

## 2018-09-04 DIAGNOSIS — R531 Weakness: Secondary | ICD-10-CM | POA: Diagnosis not present

## 2018-09-04 DIAGNOSIS — E119 Type 2 diabetes mellitus without complications: Secondary | ICD-10-CM | POA: Diagnosis not present

## 2018-09-04 DIAGNOSIS — J189 Pneumonia, unspecified organism: Secondary | ICD-10-CM | POA: Diagnosis present

## 2018-09-04 DIAGNOSIS — Z905 Acquired absence of kidney: Secondary | ICD-10-CM

## 2018-09-04 DIAGNOSIS — Z515 Encounter for palliative care: Secondary | ICD-10-CM | POA: Diagnosis not present

## 2018-09-04 DIAGNOSIS — C7972 Secondary malignant neoplasm of left adrenal gland: Secondary | ICD-10-CM | POA: Diagnosis present

## 2018-09-04 DIAGNOSIS — E860 Dehydration: Secondary | ICD-10-CM | POA: Diagnosis present

## 2018-09-04 DIAGNOSIS — E8770 Fluid overload, unspecified: Secondary | ICD-10-CM | POA: Diagnosis present

## 2018-09-04 DIAGNOSIS — R0602 Shortness of breath: Secondary | ICD-10-CM

## 2018-09-04 DIAGNOSIS — F310 Bipolar disorder, current episode hypomanic: Secondary | ICD-10-CM | POA: Diagnosis not present

## 2018-09-04 DIAGNOSIS — E1122 Type 2 diabetes mellitus with diabetic chronic kidney disease: Secondary | ICD-10-CM | POA: Diagnosis present

## 2018-09-04 DIAGNOSIS — Z79899 Other long term (current) drug therapy: Secondary | ICD-10-CM

## 2018-09-04 DIAGNOSIS — I129 Hypertensive chronic kidney disease with stage 1 through stage 4 chronic kidney disease, or unspecified chronic kidney disease: Secondary | ICD-10-CM | POA: Diagnosis present

## 2018-09-04 DIAGNOSIS — N179 Acute kidney failure, unspecified: Secondary | ICD-10-CM | POA: Diagnosis not present

## 2018-09-04 DIAGNOSIS — E785 Hyperlipidemia, unspecified: Secondary | ICD-10-CM | POA: Diagnosis present

## 2018-09-04 DIAGNOSIS — Z794 Long term (current) use of insulin: Secondary | ICD-10-CM

## 2018-09-04 DIAGNOSIS — J9601 Acute respiratory failure with hypoxia: Secondary | ICD-10-CM | POA: Diagnosis present

## 2018-09-04 DIAGNOSIS — N400 Enlarged prostate without lower urinary tract symptoms: Secondary | ICD-10-CM | POA: Diagnosis present

## 2018-09-04 DIAGNOSIS — R627 Adult failure to thrive: Secondary | ICD-10-CM | POA: Diagnosis present

## 2018-09-04 DIAGNOSIS — Z87891 Personal history of nicotine dependence: Secondary | ICD-10-CM

## 2018-09-04 DIAGNOSIS — C649 Malignant neoplasm of unspecified kidney, except renal pelvis: Secondary | ICD-10-CM | POA: Diagnosis present

## 2018-09-04 DIAGNOSIS — C7800 Secondary malignant neoplasm of unspecified lung: Secondary | ICD-10-CM | POA: Diagnosis present

## 2018-09-04 DIAGNOSIS — E871 Hypo-osmolality and hyponatremia: Secondary | ICD-10-CM | POA: Diagnosis not present

## 2018-09-04 DIAGNOSIS — Z85528 Personal history of other malignant neoplasm of kidney: Secondary | ICD-10-CM | POA: Diagnosis not present

## 2018-09-04 DIAGNOSIS — R7989 Other specified abnormal findings of blood chemistry: Secondary | ICD-10-CM

## 2018-09-04 DIAGNOSIS — R279 Unspecified lack of coordination: Secondary | ICD-10-CM | POA: Diagnosis not present

## 2018-09-04 DIAGNOSIS — Z743 Need for continuous supervision: Secondary | ICD-10-CM | POA: Diagnosis not present

## 2018-09-04 DIAGNOSIS — R5381 Other malaise: Secondary | ICD-10-CM | POA: Diagnosis present

## 2018-09-04 DIAGNOSIS — I2699 Other pulmonary embolism without acute cor pulmonale: Secondary | ICD-10-CM | POA: Diagnosis present

## 2018-09-04 DIAGNOSIS — E0781 Sick-euthyroid syndrome: Secondary | ICD-10-CM | POA: Diagnosis present

## 2018-09-04 DIAGNOSIS — Z7189 Other specified counseling: Secondary | ICD-10-CM | POA: Diagnosis not present

## 2018-09-04 DIAGNOSIS — E861 Hypovolemia: Secondary | ICD-10-CM | POA: Diagnosis present

## 2018-09-04 DIAGNOSIS — R000 Tachycardia, unspecified: Secondary | ICD-10-CM | POA: Diagnosis present

## 2018-09-04 DIAGNOSIS — H409 Unspecified glaucoma: Secondary | ICD-10-CM | POA: Diagnosis present

## 2018-09-04 DIAGNOSIS — R2681 Unsteadiness on feet: Secondary | ICD-10-CM | POA: Diagnosis not present

## 2018-09-04 LAB — INFLUENZA PANEL BY PCR (TYPE A & B)
Influenza A By PCR: NEGATIVE
Influenza B By PCR: NEGATIVE

## 2018-09-04 LAB — CBC WITH DIFFERENTIAL/PLATELET
Abs Immature Granulocytes: 0.16 10*3/uL — ABNORMAL HIGH (ref 0.00–0.07)
BASOS PCT: 1 %
Basophils Absolute: 0.1 10*3/uL (ref 0.0–0.1)
Eosinophils Absolute: 0.4 10*3/uL (ref 0.0–0.5)
Eosinophils Relative: 4 %
HCT: 39.6 % (ref 39.0–52.0)
Hemoglobin: 13.2 g/dL (ref 13.0–17.0)
Immature Granulocytes: 2 %
Lymphocytes Relative: 7 %
Lymphs Abs: 0.8 10*3/uL (ref 0.7–4.0)
MCH: 27.7 pg (ref 26.0–34.0)
MCHC: 33.3 g/dL (ref 30.0–36.0)
MCV: 83 fL (ref 80.0–100.0)
MONOS PCT: 8 %
Monocytes Absolute: 0.9 10*3/uL (ref 0.1–1.0)
Neutro Abs: 8.5 10*3/uL — ABNORMAL HIGH (ref 1.7–7.7)
Neutrophils Relative %: 78 %
Platelets: 364 10*3/uL (ref 150–400)
RBC: 4.77 MIL/uL (ref 4.22–5.81)
RDW: 12.2 % (ref 11.5–15.5)
WBC: 10.8 10*3/uL — ABNORMAL HIGH (ref 4.0–10.5)
nRBC: 0 % (ref 0.0–0.2)

## 2018-09-04 LAB — COMPREHENSIVE METABOLIC PANEL
ALBUMIN: 3.1 g/dL — AB (ref 3.5–5.0)
ALT: 200 U/L — ABNORMAL HIGH (ref 0–44)
AST: 70 U/L — ABNORMAL HIGH (ref 15–41)
Alkaline Phosphatase: 59 U/L (ref 38–126)
Anion gap: 9 (ref 5–15)
BUN: 34 mg/dL — ABNORMAL HIGH (ref 8–23)
CO2: 26 mmol/L (ref 22–32)
Calcium: 11.7 mg/dL — ABNORMAL HIGH (ref 8.9–10.3)
Chloride: 89 mmol/L — ABNORMAL LOW (ref 98–111)
Creatinine, Ser: 1.63 mg/dL — ABNORMAL HIGH (ref 0.61–1.24)
GFR calc Af Amer: 44 mL/min — ABNORMAL LOW (ref >60)
GFR calc non Af Amer: 38 mL/min — ABNORMAL LOW (ref >60)
Glucose, Bld: 176 mg/dL — ABNORMAL HIGH (ref 70–99)
Potassium: 4.4 mmol/L (ref 3.5–5.1)
Sodium: 124 mmol/L — ABNORMAL LOW (ref 135–145)
Total Bilirubin: 0.9 mg/dL (ref 0.3–1.2)
Total Protein: 6.9 g/dL (ref 6.5–8.1)

## 2018-09-04 LAB — LACTIC ACID, PLASMA: Lactic Acid, Venous: 1 mmol/L (ref 0.5–1.9)

## 2018-09-04 LAB — GLUCOSE, CAPILLARY
Glucose-Capillary: 117 mg/dL — ABNORMAL HIGH (ref 70–99)
Glucose-Capillary: 145 mg/dL — ABNORMAL HIGH (ref 70–99)

## 2018-09-04 LAB — URINALYSIS, ROUTINE W REFLEX MICROSCOPIC
Bilirubin Urine: NEGATIVE
Glucose, UA: NEGATIVE mg/dL
Hgb urine dipstick: NEGATIVE
Ketones, ur: NEGATIVE mg/dL
Leukocytes, UA: NEGATIVE
Nitrite: NEGATIVE
PROTEIN: NEGATIVE mg/dL
Specific Gravity, Urine: 1.011 (ref 1.005–1.030)
pH: 7 (ref 5.0–8.0)

## 2018-09-04 LAB — CBG MONITORING, ED: GLUCOSE-CAPILLARY: 153 mg/dL — AB (ref 70–99)

## 2018-09-04 MED ORDER — INSULIN ASPART 100 UNIT/ML ~~LOC~~ SOLN
3.0000 [IU] | Freq: Three times a day (TID) | SUBCUTANEOUS | Status: DC
  Administered 2018-09-04 – 2018-09-05 (×3): 3 [IU] via SUBCUTANEOUS

## 2018-09-04 MED ORDER — SODIUM CHLORIDE 0.9 % IV BOLUS
1000.0000 mL | Freq: Once | INTRAVENOUS | Status: AC
  Administered 2018-09-04: 1000 mL via INTRAVENOUS

## 2018-09-04 MED ORDER — LOSARTAN POTASSIUM 50 MG PO TABS
100.0000 mg | ORAL_TABLET | Freq: Every day | ORAL | Status: DC
  Administered 2018-09-04 – 2018-09-05 (×2): 100 mg via ORAL
  Filled 2018-09-04 (×2): qty 2

## 2018-09-04 MED ORDER — VITAMIN D 25 MCG (1000 UNIT) PO TABS
1000.0000 [IU] | ORAL_TABLET | Freq: Every day | ORAL | Status: DC
  Administered 2018-09-04 – 2018-09-13 (×10): 1000 [IU] via ORAL
  Filled 2018-09-04 (×10): qty 1

## 2018-09-04 MED ORDER — SODIUM CHLORIDE 0.9 % IV SOLN
INTRAVENOUS | Status: DC
  Administered 2018-09-04 – 2018-09-06 (×2): via INTRAVENOUS

## 2018-09-04 MED ORDER — OLANZAPINE 2.5 MG PO TABS
2.5000 mg | ORAL_TABLET | Freq: Every day | ORAL | Status: DC
  Administered 2018-09-04 – 2018-09-14 (×11): 2.5 mg via ORAL
  Filled 2018-09-04 (×12): qty 1

## 2018-09-04 MED ORDER — ENSURE ENLIVE PO LIQD
237.0000 mL | Freq: Two times a day (BID) | ORAL | Status: DC
  Administered 2018-09-05 – 2018-09-15 (×17): 237 mL via ORAL

## 2018-09-04 MED ORDER — SODIUM CHLORIDE 0.9% FLUSH
3.0000 mL | Freq: Once | INTRAVENOUS | Status: AC
  Administered 2018-09-04: 3 mL via INTRAVENOUS

## 2018-09-04 MED ORDER — ENOXAPARIN SODIUM 30 MG/0.3ML ~~LOC~~ SOLN
30.0000 mg | SUBCUTANEOUS | Status: DC
  Administered 2018-09-04 – 2018-09-05 (×2): 30 mg via SUBCUTANEOUS
  Filled 2018-09-04 (×2): qty 0.3

## 2018-09-04 MED ORDER — INSULIN ASPART 100 UNIT/ML ~~LOC~~ SOLN
0.0000 [IU] | Freq: Three times a day (TID) | SUBCUTANEOUS | Status: DC
  Administered 2018-09-05: 5 [IU] via SUBCUTANEOUS

## 2018-09-04 MED ORDER — LATANOPROST 0.005 % OP SOLN
1.0000 [drp] | Freq: Every day | OPHTHALMIC | Status: DC
  Administered 2018-09-04 – 2018-09-14 (×11): 1 [drp] via OPHTHALMIC
  Filled 2018-09-04: qty 2.5

## 2018-09-04 NOTE — ED Triage Notes (Signed)
Per [facility]- Pt resides at home. FULL code. Renal Cancer pt with METS. Called by PCP with abnormal labs this AM. Her for re evaluation. Pt endorse no complaints at presents.

## 2018-09-04 NOTE — ED Notes (Signed)
ED Provider at bedside. CAMPOS

## 2018-09-04 NOTE — ED Notes (Signed)
Patient transported to X-ray 

## 2018-09-04 NOTE — ED Notes (Signed)
ADMISSION MD CHANGING PT'S BED FROM MED-SURG TO TELEMETRY

## 2018-09-04 NOTE — ED Notes (Signed)
Bed: XE15 Expected date:  Expected time:  Means of arrival:  Comments: Abnormal labs EMS

## 2018-09-04 NOTE — ED Notes (Signed)
CHEMO THERAPY INFUSION APPROX. 2 WEEKS AGO.

## 2018-09-04 NOTE — ED Notes (Signed)
UPON ARRIVAL TO ROOM, PT USING URINAL. PT ENDORSE NO CHEST PAIN, NO PAIN. MACON T RN ASSISTED WITH THIS WRITER PT BACK UP IN BED. PT ALERT AND ACTIVE WITH CARE. HR 140'S. HR DECREASED 118. EKG PERFORMED AND GIVEN TO EDP CAMPOS FOR REVIEW. DETAILS OF EVENT AND PT'S CURRENT STATUS UPDATED WITH EDP CAMPOS. PT ENDORSE NO COMPLAINT AT THIS TIME. PT REQUESTING UPDATE.

## 2018-09-04 NOTE — H&P (Signed)
History and Physical    Blake Hines BPZ:[id number] DOB: 07/26/1931 DOA: 09/04/2018  PCP: Crist Infante, MD Patient coming from: Home patient lives alone  Chief Complaint: Generalized weakness  HPI: Blake Hines is a 85 y.o. male with medical history significant of metastatic renal cell carcinoma status post left nephrectomy with numerous bilateral lung nodules and large mediastinal lymphadenopathy and metastatic left adrenal mass on treatment with immunotherapy with a combination of ipilimumab and nivolumab every 3 weeks for 4 cycles followed by maintenance treatment with nivolumab if the patient has no evidence of disease progression after cycle 4.  He says last session was 3 weeks ago and his next session is supposed to be tomorrow.  He has a history of bipolar disorder chronic kidney disease COPD depression diabetes and hypertension.  Patient has been having low-grade fever 100.6 with chills at home along with mild shortness of breath, no cough cough or hemoptysis or chest pain.  No nausea vomiting diarrhea abdominal pain urinary complaints.  He does have decreased appetite and decreased p.o. intake.  Denies any constipation.  His main complaint today is generalized weakness.  At baseline he cooks his own meals walks around in the house outside the house without a walker he found himself with not able to do all the things that he did before with generalized weakness no focal weakness no headaches changes with his vision no difficulty speech or swallowing.    ED Course: Sodium 124 potassium 4.4 BUN 34 creatinine 1.63 white count 10.8 hemoglobin 13.2 platelet count 364 TSH 2.3 blood pressure 151/99 pulse 110 respiration 18 temperature 98.3.  Review of Systems: As per HPI otherwise all other systems reviewed and are negative  Ambulatory Status at baseline he is ambulatory and does all ADLs by himself Past Medical History:  Diagnosis Date  . Bipolar 1 disorder ([place])   . BPH (benign prostatic  hyperplasia)   . Cancer ([place])    RCC  . Cataract   . Chronic kidney disease   . COPD (chronic obstructive pulmonary disease) ([place])   . Depression   . Diabetes mellitus without complication ([place])   . Hypertension     History reviewed. No pertinent surgical history.  Social History   Socioeconomic History  . Marital status: Widowed    Spouse name: Not on file  . Number of children: Not on file  . Years of education: Not on file  . Highest education level: Not on file  Occupational History  . Not on file  Social Needs  . Financial resource strain: Not on file  . Food insecurity:    Worry: Not on file    Inability: Not on file  . Transportation needs:    Medical: Not on file    Non-medical: Not on file  Tobacco Use  . Smoking status: Former Smoker    Packs/day: 0.50    Years: 40.00    Pack years: 20.00    Types: Cigarettes    Last attempt to quit: 07/13/2003    Years since quitting: 15.1  . Smokeless tobacco: Never Used  Substance and Sexual Activity  . Alcohol use: Yes    Comment: 50 cc wild [place] daily  . Drug use: Not on file  . Sexual activity: Not on file  Lifestyle  . Physical activity:    Days per week: Not on file    Minutes per session: Not on file  . Stress: Not on file  Relationships  . Social connections:  Talks on phone: Not on file    Gets together: Not on file    Attends religious service: Not on file    Active member of club or organization: Not on file    Attends meetings of clubs or organizations: Not on file    Relationship status: Not on file  . Intimate partner violence:    Fear of current or ex partner: Not on file    Emotionally abused: Not on file    Physically abused: Not on file    Forced sexual activity: Not on file  Other Topics Concern  . Not on file  Social History Narrative  . Not on file    No Known Allergies  History reviewed. No pertinent family history.    Prior to Admission medications   Medication Sig Start  Date End Date Taking? Authorizing Provider  ANORO ELLIPTA 62.5-25 MCG/INH AEPB Inhale 1 puff into the lungs daily.  06/19/18  Yes [provider]  cholecalciferol (VITAMIN D3) 25 MCG (1000 UT) tablet Take 1,000 Units by mouth daily.   Yes [provider]  Coenzyme Q10 (COQ10) 200 MG CAPS Take 200 mg by mouth daily.    Yes [provider]  fenofibrate (TRICOR) 48 MG tablet Take 48 mg by mouth daily.   Yes [provider]  hydrochlorothiazide (HYDRODIURIL) 25 MG tablet Take 25 mg by mouth daily.   Yes [provider]  insulin lispro (HUMALOG KWIKPEN) 100 UNIT/ML KwikPen Inject 4 Units into the skin every evening. With dinner    Yes [provider]  latanoprost (XALATAN) 0.005 % ophthalmic solution Place 1 drop into both eyes at bedtime.   Yes [provider]  losartan (COZAAR) 100 MG tablet Take 100 mg by mouth daily.   Yes [provider]  Multiple Vitamins-Minerals (MULTIVITAMIN WITH MINERALS) tablet Take 1 tablet by mouth daily.   Yes [provider]  OLANZapine (ZYPREXA) 2.5 MG tablet Take 2.5 mg by mouth at bedtime.   Yes [provider]  sertraline (ZOLOFT) 50 MG tablet Take 50 mg by mouth daily.   Yes [provider]  sitaGLIPtin (JANUVIA) 50 MG tablet Take 50 mg by mouth every morning.   Yes [provider]  TOUJEO SOLOSTAR 300 UNIT/ML SOPN Inject 6 Units into the skin daily.  08/19/18  Yes [provider]  FREESTYLE LITE test strip USE TO TEST BLOOD SUGAR TWICE DAILY (E11.29) 07/03/18   [provider]    Physical Exam: Vitals:   09/04/18 1330 09/04/18 1400 09/04/18 1408 09/04/18 1409  BP: (!) 148/94  (!) 151/99   Pulse: (!) 102 (!) 140 (!) 113 (!) 110  Resp: 18 16 (!) 23 (!) 25  Temp:      TempSrc:      SpO2: 97% 92% 95% 96%  Weight:      Height:         . General:  Appears calm and comfortable . Eyes:  PERRL, EOMI, normal lids, iris . ENT: grossly  normal hearing, lips & tongue, mucous membrane dry . Neck:  no LAD, masses or thyromegaly . Cardiovascular:  RRR, no m/r/g. No LE edema.  Marland Kitchen Respiratory:  CTA bilaterally, no w/r/r. Normal respiratory effort. . Abdomen: soft, ntnd, NABS . Skin: no rash or induration seen on limited exam . Musculoskeletal:  grossly normal tone BUE/BLE, good ROM, no bony abnormality . Psychiatric:  grossly normal mood and affect, speech fluent and appropriate, AOx3 . Neurologic: CN 2-12 grossly intact, moves  all extremities in coordinated fashion, sensation intact  Labs on Admission: I have personally reviewed following labs and imaging studies  CBC: Recent Labs  Lab 08/31/18 1129 09/04/18 1015  WBC 10.1 10.8*  NEUTROABS 7.8* 8.5*  HGB 14.1 13.2  HCT 41.0 39.6  MCV 82.8 83.0  PLT 304 638   Basic Metabolic Panel: Recent Labs  Lab 08/31/18 1129 09/04/18 1015  NA 125* 124*  K 4.7 4.4  CL 89* 89*  CO2 26 26  GLUCOSE 380* 176*  BUN 31* 34*  CREATININE 1.91* 1.63*  CALCIUM 11.4* 11.7*   GFR: Estimated Creatinine Clearance: 32.1 mL/min (A) (by C-G formula based on SCr of 1.63 mg/dL (H)). Liver Function Tests: Recent Labs  Lab 08/31/18 1129 09/04/18 1015  AST 137* 70*  ALT 306* 200*  ALKPHOS 88 59  BILITOT 1.0 0.9  PROT 6.9 6.9  ALBUMIN 2.9* 3.1*   No results for input(s): LIPASE, AMYLASE in the last 168 hours. No results for input(s): AMMONIA in the last 168 hours. Coagulation Profile: No results for input(s): INR, PROTIME in the last 168 hours. Cardiac Enzymes: No results for input(s): CKTOTAL, CKMB, CKMBINDEX, TROPONINI in the last 168 hours. BNP (last 3 results) No results for input(s): PROBNP in the last 8760 hours. HbA1C: No results for input(s): HGBA1C in the last 72 hours. CBG: No results for input(s): GLUCAP in the last 168 hours. Lipid Profile: No results for input(s): CHOL, HDL, LDLCALC, TRIG, CHOLHDL, LDLDIRECT in the last 72 hours. Thyroid Function Tests: No  results for input(s): TSH, T4TOTAL, FREET4, T3FREE, THYROIDAB in the last 72 hours. Anemia Panel: No results for input(s): VITAMINB12, FOLATE, FERRITIN, TIBC, IRON, RETICCTPCT in the last 72 hours. Urine analysis:    Component Value Date/Time   COLORURINE YELLOW 09/04/2018 1110   APPEARANCEUR CLEAR 09/04/2018 1110   LABSPEC 1.011 09/04/2018 1110   PHURINE 7.0 09/04/2018 1110   GLUCOSEU NEGATIVE 09/04/2018 1110   HGBUR NEGATIVE 09/04/2018 1110   BILIRUBINUR NEGATIVE 09/04/2018 1110   KETONESUR NEGATIVE 09/04/2018 1110   PROTEINUR NEGATIVE 09/04/2018 1110   NITRITE NEGATIVE 09/04/2018 1110   LEUKOCYTESUR NEGATIVE 09/04/2018 1110    Creatinine Clearance: Estimated Creatinine Clearance: 32.1 mL/min (A) (by C-G formula based on SCr of 1.63 mg/dL (H)).  Sepsis Labs: @LABRCNTIP (procalcitonin:4,lacticidven:4) )No results found for this or any previous visit (from the past 240 hour(s)).   Radiological Exams on Admission: Dg Chest 2 View  Result Date: 09/04/2018 CLINICAL DATA:  Shortness of breath EXAM: CHEST - 2 VIEW COMPARISON:  08/03/2018 FINDINGS: Patchy bilateral lower lobe airspace opacities are new since prior study concerning for pneumonia. Heart is normal size. No effusions or pneumothorax. No acute bony abnormality. IMPRESSION: Patchy bilateral lower lobe airspace opacities concerning for pneumonia. Electronically Signed   By: Rolm Baptise M.D.   On: 09/04/2018 10:24    EKG: Independently reviewed.  Assessment/Plan Active Problems:   Hyponatremia   #1 hypovolemic hyponatremia acute on chronic secondary to diminished nutritional intake and hydrochlorothiazide, he is also on Zoloft 50 mg daily which could cause hyponatremia which I will hold..-patient presented with complaints of generalized weakness his last chemotherapy session was 3 weeks ago.  He has decreased p.o. intake And decreased appetite.  He was found to have a sodium of 124.  4 days prior to admission his sodium was  125.  Early January his sodium was 1 26-1 30.  I will hydrate him with normal saline he appears dry with dry mucous membrane and tachycardia.  Follow-up  labs tomorrow.  #2 newly diagnosed metastatic renal cell carcinoma December 2019 followed by Dr. Julien Nordmann.  Started on first chemotherapy July 26, 2018.  Patient reports ever since he has been having generalized weakness and fatigue decreased appetite.  #3 AKI at baseline continue IV fluids  #4 type 2 diabetes patient takes Januvia I will treat him with SSI during the hospital stay.  #5 hypertension restart Cozaar hold hydrochlorothiazide in view of hyponatremia  #6 history of COPD continue home inhalers.  #7 hyperlipidemia continue TriCor.  #8 glaucoma continue Xalatan eyedrops.  #9 history of bipolar disorder on Zyprexa 2.5 mg at bedtime continue.  #10 increased LFTs since outpatient visits noted question related to chemotherapy follow levels tomorrow.   Estimated body mass index is 26.98 kg/m as calculated from the following:   Height as of this encounter: 5\' 8"  (1.727 m).   Weight as of this encounter: 80.5 kg.   DVT prophylaxis: Lovenox Code Status: Full code Family Communication: No family available he has a daughter and the son son lives in the [place] state and daughter lives in [place] Disposition Plan: Pending clinical improvement and PT consult  consults called: None admission status: Observation   Georgette Shell MD [facility]  If 7PM-7AM, please contact night-coverage [email or web address] Password [email or web address]  09/04/2018, 4:12 PM

## 2018-09-04 NOTE — ED Notes (Signed)
CALLING REPORT TO ABBY RN [id number]. PT BED ASSIGNMENT MED SURG. ? TELEMETRY. CALLED ADMISSION MD FOR VERIFICATION. AWAITING RESPONSE.

## 2018-09-04 NOTE — ED Provider Notes (Signed)
[facility] DEPT Provider Note   CSN: [id number] Arrival date & time: 09/04/18  4098     History   Chief Complaint Chief Complaint  Patient presents with  . abnormal labs  . Cancer    Renal with mets  . Shortness of Breath    Blake Hines is a 85 y.o. male.  Blake Patient is 85 year old male with metastatic renal cell carcinoma currently undergoing palliative immunotherapy treatment.  He was called by his office today and was told that his labs were abnormal and to present to the emergency department for further evaluation.  Patient reports mild generalized weakness.  Denies chest pain.  No abdominal pain.  Denies shortness of breath.  No nausea or vomiting.  No fevers or chills.  No unilateral arm or leg weakness.  Symptoms are mild in severity.   Past Medical History:  Diagnosis Date  . Bipolar 1 disorder ([place])   . BPH (benign prostatic hyperplasia)   . Cancer ([place])    RCC  . Cataract   . Chronic kidney disease   . COPD (chronic obstructive pulmonary disease) ([place])   . Depression   . Diabetes mellitus without complication ([place])   . Hypertension     Patient Active Problem List   Diagnosis Date Noted  . Goals of care, counseling/discussion 07/14/2018  . Encounter for antineoplastic immunotherapy 07/14/2018  . Metastatic renal cell carcinoma to lung, left ([place]) 07/12/2018    History reviewed. No pertinent surgical history.      Home Medications    Prior to Admission medications   Medication Sig Start Date End Date Taking? Authorizing Provider  ANORO ELLIPTA 62.5-25 MCG/INH AEPB Inhale 1 puff into the lungs daily.  06/19/18  Yes [provider]  cholecalciferol (VITAMIN D3) 25 MCG (1000 UT) tablet Take 1,000 Units by mouth daily.   Yes [provider]  Coenzyme Q10 (COQ10) 200 MG CAPS Take 200 mg by mouth daily.    Yes [provider]  fenofibrate (TRICOR) 48 MG tablet Take 48 mg by mouth daily.   Yes  [provider]  hydrochlorothiazide (HYDRODIURIL) 25 MG tablet Take 25 mg by mouth daily.   Yes [provider]  insulin lispro (HUMALOG KWIKPEN) 100 UNIT/ML KwikPen Inject 4 Units into the skin every evening. With dinner    Yes [provider]  latanoprost (XALATAN) 0.005 % ophthalmic solution Place 1 drop into both eyes at bedtime.   Yes [provider]  losartan (COZAAR) 100 MG tablet Take 100 mg by mouth daily.   Yes [provider]  Multiple Vitamins-Minerals (MULTIVITAMIN WITH MINERALS) tablet Take 1 tablet by mouth daily.   Yes [provider]  OLANZapine (ZYPREXA) 2.5 MG tablet Take 2.5 mg by mouth at bedtime.   Yes [provider]  sertraline (ZOLOFT) 50 MG tablet Take 50 mg by mouth daily.   Yes [provider]  sitaGLIPtin (JANUVIA) 50 MG tablet Take 50 mg by mouth every morning.   Yes [provider]  TOUJEO SOLOSTAR 300 UNIT/ML SOPN Inject 6 Units into the skin daily.  08/19/18  Yes [provider]  FREESTYLE LITE test strip USE TO TEST BLOOD SUGAR TWICE DAILY (E11.29) 07/03/18   [provider]    Family History History reviewed. No pertinent family history.  Social History Social History   Tobacco Use  . Smoking status: Former Smoker    Packs/day: 0.50    Years: 40.00    Pack years:  20.00    Types: Cigarettes    Last attempt to quit: 07/13/2003    Years since quitting: 15.1  . Smokeless tobacco: Never Used  Substance Use Topics  . Alcohol use: Yes    Comment: 50 cc wild [place] daily  . Drug use: Not on file     Allergies   Patient has no known allergies.   Review of Systems Review of Systems  All other systems reviewed and are negative.    Physical Exam Updated Vital Signs BP (!) 151/99   Pulse (!) 110   Temp 98.3 F (36.8 C) (Oral)   Resp (!) 25   Ht 5\' 8"  (1.727 m)   Wt 80.5 kg   SpO2 96%   BMI 26.98 kg/m   Physical Exam Vitals signs and nursing  note reviewed.  Constitutional:      Appearance: He is well-developed.  HENT:     Head: Normocephalic and atraumatic.  Neck:     Musculoskeletal: Normal range of motion.  Cardiovascular:     Rate and Rhythm: Normal rate and regular rhythm.     Heart sounds: Normal heart sounds.  Pulmonary:     Effort: Pulmonary effort is normal. No respiratory distress.     Breath sounds: Normal breath sounds.  Abdominal:     General: There is no distension.     Palpations: Abdomen is soft.     Tenderness: There is no abdominal tenderness.  Musculoskeletal: Normal range of motion.  Skin:    General: Skin is warm and dry.  Neurological:     Mental Status: He is alert and oriented to person, place, and time.  Psychiatric:        Judgment: Judgment normal.      ED Treatments / Results  Labs (all labs ordered are listed, but only abnormal results are displayed) Labs Reviewed  COMPREHENSIVE METABOLIC PANEL - Abnormal; Notable for the following components:      Result Value   Sodium 124 (*)    Chloride 89 (*)    Glucose, Bld 176 (*)    BUN 34 (*)    Creatinine, Ser 1.63 (*)    Calcium 11.7 (*)    Albumin 3.1 (*)    AST 70 (*)    ALT 200 (*)    GFR calc non Af Amer 38 (*)    GFR calc Af Amer 44 (*)    All other components within normal limits  CBC WITH DIFFERENTIAL/PLATELET - Abnormal; Notable for the following components:   WBC 10.8 (*)    Neutro Abs 8.5 (*)    Abs Immature Granulocytes 0.16 (*)    All other components within normal limits  CULTURE, BLOOD (ROUTINE X 2)  CULTURE, BLOOD (ROUTINE X 2)  URINE CULTURE  LACTIC ACID, PLASMA  URINALYSIS, ROUTINE W REFLEX MICROSCOPIC  INFLUENZA PANEL BY PCR (TYPE A & B)   BUN  Date Value Ref Range Status  09/04/2018 34 (H) 8 - 23 mg/dL Final  08/31/2018 31 (H) 8 - 23 mg/dL Final  08/24/2018 31 (H) 8 - 23 mg/dL Final  08/16/2018 26 (H) 8 - 23 mg/dL Final   Creatinine  Date Value Ref Range Status  08/31/2018 1.91 (H) 0.61 - 1.24  mg/dL Final  08/24/2018 2.03 (H) 0.61 - 1.24 mg/dL Final  08/16/2018 1.64 (H) 0.61 - 1.24 mg/dL Final  08/10/2018 1.62 (H) 0.61 - 1.24 mg/dL Final   Creatinine, Ser  Date Value Ref Range Status  09/04/2018 1.63 (H) 0.61 -  1.24 mg/dL Final    Sodium  Date Value Ref Range Status  09/04/2018 124 (L) 135 - 145 mmol/L Final  08/31/2018 125 (L) 135 - 145 mmol/L Final  08/24/2018 125 (L) 135 - 145 mmol/L Final  08/16/2018 130 (L) 135 - 145 mmol/L Final    EKG EKG Interpretation  Date/Time:  Tuesday September 04 2018 14:09:06 EST Ventricular Rate:  111 PR Interval:    QRS Duration: 150 QT Interval:  351 QTC Calculation: 477 R Axis:   -84 Text Interpretation:  Sinus tachycardia RBBB and LAFB  RBBB new since prior tracing Confirmed by Jola Schmidt [phone]) on 09/04/2018 2:30:47 PM   Radiology Dg Chest 2 View  Result Date: 09/04/2018 CLINICAL DATA:  Shortness of breath EXAM: CHEST - 2 VIEW COMPARISON:  08/03/2018 FINDINGS: Patchy bilateral lower lobe airspace opacities are new since prior study concerning for pneumonia. Heart is normal size. No effusions or pneumothorax. No acute bony abnormality. IMPRESSION: Patchy bilateral lower lobe airspace opacities concerning for pneumonia. Electronically Signed   By: Rolm Baptise M.D.   On: 09/04/2018 10:24    Procedures Procedures (including critical care time)  Medications Ordered in ED Medications  sodium chloride flush (NS) 0.9 % injection 3 mL (3 mLs Intravenous Given 09/04/18 1035)     Initial Impression / Assessment and Plan / ED Course  I have reviewed the triage vital signs and the nursing notes.  Pertinent labs & imaging results that were available during my care of the patient were reviewed by me and considered in my medical decision making (see chart for details).     Patient will receive IV fluids at this time and admission for generalized weakness and hyponatremia.  Mild tachycardia.  Sinus tachycardia noted.  Final  Clinical Impressions(s) / ED Diagnoses   Final diagnoses:  None    ED Discharge Orders    None       Jola Schmidt, MD 09/04/18 1431

## 2018-09-04 NOTE — ED Notes (Addendum)
BLOOD CULTURE X 1 OBTAINED LEFT UPPER FOREARM

## 2018-09-05 ENCOUNTER — Inpatient Hospital Stay: Payer: Medicare Other | Admitting: Internal Medicine

## 2018-09-05 ENCOUNTER — Inpatient Hospital Stay: Payer: Medicare Other

## 2018-09-05 DIAGNOSIS — E860 Dehydration: Secondary | ICD-10-CM

## 2018-09-05 DIAGNOSIS — N179 Acute kidney failure, unspecified: Secondary | ICD-10-CM

## 2018-09-05 DIAGNOSIS — R945 Abnormal results of liver function studies: Secondary | ICD-10-CM

## 2018-09-05 LAB — COMPREHENSIVE METABOLIC PANEL
ALK PHOS: 49 U/L (ref 38–126)
ALT: 148 U/L — ABNORMAL HIGH (ref 0–44)
AST: 56 U/L — ABNORMAL HIGH (ref 15–41)
Albumin: 2.6 g/dL — ABNORMAL LOW (ref 3.5–5.0)
Anion gap: 8 (ref 5–15)
BUN: 28 mg/dL — ABNORMAL HIGH (ref 8–23)
CALCIUM: 10.7 mg/dL — AB (ref 8.9–10.3)
CO2: 25 mmol/L (ref 22–32)
Chloride: 94 mmol/L — ABNORMAL LOW (ref 98–111)
Creatinine, Ser: 1.4 mg/dL — ABNORMAL HIGH (ref 0.61–1.24)
GFR calc Af Amer: 53 mL/min — ABNORMAL LOW (ref >60)
GFR calc non Af Amer: 45 mL/min — ABNORMAL LOW (ref >60)
GLUCOSE: 107 mg/dL — AB (ref 70–99)
Potassium: 4.3 mmol/L (ref 3.5–5.1)
Sodium: 127 mmol/L — ABNORMAL LOW (ref 135–145)
Total Bilirubin: 0.7 mg/dL (ref 0.3–1.2)
Total Protein: 5.8 g/dL — ABNORMAL LOW (ref 6.5–8.1)

## 2018-09-05 LAB — CBC
HCT: 36.2 % — ABNORMAL LOW (ref 39.0–52.0)
HEMOGLOBIN: 12 g/dL — AB (ref 13.0–17.0)
MCH: 28.4 pg (ref 26.0–34.0)
MCHC: 33.1 g/dL (ref 30.0–36.0)
MCV: 85.6 fL (ref 80.0–100.0)
Platelets: 317 10*3/uL (ref 150–400)
RBC: 4.23 MIL/uL (ref 4.22–5.81)
RDW: 12.4 % (ref 11.5–15.5)
WBC: 9.4 10*3/uL (ref 4.0–10.5)
nRBC: 0 % (ref 0.0–0.2)

## 2018-09-05 LAB — URINE CULTURE: Culture: NO GROWTH

## 2018-09-05 LAB — GLUCOSE, CAPILLARY
Glucose-Capillary: 97 mg/dL (ref 70–99)
Glucose-Capillary: 209 mg/dL — ABNORMAL HIGH (ref 70–99)
Glucose-Capillary: 101 mg/dL — ABNORMAL HIGH (ref 70–99)
Glucose-Capillary: 202 mg/dL — ABNORMAL HIGH (ref 70–99)

## 2018-09-05 MED ORDER — INSULIN ASPART 100 UNIT/ML ~~LOC~~ SOLN
0.0000 [IU] | Freq: Three times a day (TID) | SUBCUTANEOUS | Status: DC
  Administered 2018-09-06 (×2): 3 [IU] via SUBCUTANEOUS
  Administered 2018-09-07: 2 [IU] via SUBCUTANEOUS
  Administered 2018-09-07: 3 [IU] via SUBCUTANEOUS
  Administered 2018-09-07: 7 [IU] via SUBCUTANEOUS
  Administered 2018-09-08 (×2): 3 [IU] via SUBCUTANEOUS
  Administered 2018-09-08: 2 [IU] via SUBCUTANEOUS
  Administered 2018-09-09: 3 [IU] via SUBCUTANEOUS
  Administered 2018-09-09: 2 [IU] via SUBCUTANEOUS
  Administered 2018-09-09 – 2018-09-10 (×3): 3 [IU] via SUBCUTANEOUS
  Administered 2018-09-10: 5 [IU] via SUBCUTANEOUS
  Administered 2018-09-11: 2 [IU] via SUBCUTANEOUS
  Administered 2018-09-11: 3 [IU] via SUBCUTANEOUS
  Administered 2018-09-11: 2 [IU] via SUBCUTANEOUS
  Administered 2018-09-12: 5 [IU] via SUBCUTANEOUS
  Administered 2018-09-12: 3 [IU] via SUBCUTANEOUS
  Administered 2018-09-12 – 2018-09-13 (×3): 2 [IU] via SUBCUTANEOUS
  Administered 2018-09-13: 3 [IU] via SUBCUTANEOUS
  Administered 2018-09-14 (×2): 2 [IU] via SUBCUTANEOUS
  Administered 2018-09-14: 1 [IU] via SUBCUTANEOUS
  Administered 2018-09-15 (×2): 2 [IU] via SUBCUTANEOUS

## 2018-09-05 MED ORDER — SODIUM CHLORIDE 0.9 % IV BOLUS: 500.0000 mL | Freq: Once | INTRAVENOUS | Status: DC

## 2018-09-05 MED ORDER — INSULIN ASPART 100 UNIT/ML ~~LOC~~ SOLN
0.0000 [IU] | Freq: Every day | SUBCUTANEOUS | Status: DC
  Administered 2018-09-05 – 2018-09-14 (×8): 2 [IU] via SUBCUTANEOUS

## 2018-09-05 MED ORDER — ACETAMINOPHEN 325 MG PO TABS
650.0000 mg | ORAL_TABLET | Freq: Four times a day (QID) | ORAL | Status: DC | PRN
  Administered 2018-09-13: 650 mg via ORAL
  Filled 2018-09-05: qty 2

## 2018-09-05 NOTE — Evaluation (Signed)
Physical Therapy Evaluation Patient Details Name: Blake Hines MRN: [id number] DOB: [birth year] Today's Date: 09/05/2018   History of Present Illness  Patient is 85 year old male with metastatic renal cell carcinoma currently undergoing palliative immunotherapy treatment.  Pt admitted with hyponatremia.  Clinical Impression  Pt admitted with above diagnosis. Pt currently with functional limitations due to the deficits listed below (see PT Problem List).  Pt will benefit from skilled PT to increase their independence and safety with mobility to allow discharge to the venue listed below.  Recommend HHPT and RW for short term use.  Also recommended he stay on 1st floor of home until he regains his strength.  He verbalized understanding.     Follow Up Recommendations Home health PT    Equipment Recommendations  Rolling walker with 5" wheels    Recommendations for Other Services       Precautions / Restrictions Precautions Precautions: Fall Precaution Comments: monitor o2 sats Restrictions Weight Bearing Restrictions: No      Mobility  Bed Mobility Overal bed mobility: Needs Assistance Bed Mobility: Supine to Sit     Supine to sit: Min guard     General bed mobility comments: HOB slightly elevated, MIN /guard for lines  Transfers Overall transfer level: Needs assistance Equipment used: Rolling walker (2 wheeled) Transfers: Sit to/from Stand Sit to Stand: Min guard         General transfer comment: cues for hand placement and not pull up on the RW  Ambulation/Gait Ambulation/Gait assistance: Min guard Gait Distance (Feet): 200 Feet Assistive device: Rolling walker (2 wheeled) Gait Pattern/deviations: Step-through pattern;Decreased step length - right;Decreased step length - left     General Gait Details: amb with o2 at 2 L/min. after gait was 91%. 2/4 dyspnea noted  Stairs            Wheelchair Mobility    Modified Rankin (Stroke Patients Only)        Balance Overall balance assessment: Needs assistance   Sitting balance-Leahy Scale: Good       Standing balance-Leahy Scale: Fair                               Pertinent Vitals/Pain Pain Assessment: No/denies pain    Home Living Family/patient expects to be discharged to:: Private residence Living Arrangements: Alone   Type of Home: House Home Access: Stairs to enter   [employer] of Steps: 1 then 2 Home Layout: Two level Home Equipment: Cane - single point;Bedside commode      Prior Function Level of Independence: Independent               Hand Dominance        Extremity/Trunk Assessment   Upper Extremity Assessment Upper Extremity Assessment: Overall WFL for tasks assessed    Lower Extremity Assessment Lower Extremity Assessment: Overall WFL for tasks assessed;Generalized weakness    Cervical / Trunk Assessment Cervical / Trunk Assessment: Normal  Communication   Communication: No difficulties  Cognition Arousal/Alertness: Awake/alert Behavior During Therapy: WFL for tasks assessed/performed Overall Cognitive Status: Within Functional Limits for tasks assessed                                        General Comments      Exercises     Assessment/Plan    PT Assessment Patient needs  continued PT services  PT Problem List Decreased strength;Decreased mobility;Decreased balance;Cardiopulmonary status limiting activity       PT Treatment Interventions DME instruction;Gait training;Therapeutic activities;Functional mobility training;Therapeutic exercise;Stair training;Neuromuscular re-education    PT Goals (Current goals can be found in the Care Plan section)  Acute Rehab PT Goals Patient Stated Goal: home PT Goal Formulation: With patient Time For Goal Achievement: 09/19/18 Potential to Achieve Goals: Good    Frequency Min 3X/week   Barriers to discharge        Co-evaluation                AM-PAC PT "6 Clicks" Mobility  Outcome Measure Help needed turning from your back to your side while in a flat bed without using bedrails?: None Help needed moving from lying on your back to sitting on the side of a flat bed without using bedrails?: A Little Help needed moving to and from a bed to a chair (including a wheelchair)?: A Little Help needed standing up from a chair using your arms (e.g., wheelchair or bedside chair)?: A Little Help needed to walk in hospital room?: A Little Help needed climbing 3-5 steps with a railing? : A Little 6 Click Score: 19    End of Session Equipment Utilized During Treatment: Gait belt Activity Tolerance: Patient tolerated treatment well Patient left: in chair;with chair alarm set;with call bell/phone within reach;with family/visitor present(chair alarm in chair, but no box, RN notified) Nurse Communication: Mobility status PT Visit Diagnosis: Difficulty in walking, not elsewhere classified (R26.2);Muscle weakness (generalized) (M62.81)    Time: 5597-4163 PT Time Calculation (min) (ACUTE ONLY): 31 min   Charges:   PT Evaluation $PT Eval Moderate Complexity: 1 Mod PT Treatments $Gait Training: 8-22 mins        Blake Hines L. Blake Hines, [place] Pager [phone] 09/05/2018   Blake Hines 09/05/2018, 2:32 PM

## 2018-09-05 NOTE — Progress Notes (Signed)
Initial Nutrition Assessment  INTERVENTION:   Continue Ensure Enlive po BID, each supplement provides 350 kcal and 20 grams of protein  NUTRITION DIAGNOSIS:   Increased nutrient needs related to cancer and cancer related treatments as evidenced by estimated needs.  GOAL:   Patient will meet greater than or equal to 90% of their needs  MONITOR:   PO intake, Supplement acceptance, Labs, Weight trends, I & O's  REASON FOR ASSESSMENT:   Malnutrition Screening Tool    ASSESSMENT:   85 y.o. male with medical history significant of metastatic renal cell carcinoma status post left nephrectomy with numerous bilateral lung nodules and large mediastinal lymphadenopathy and metastatic left adrenal mass on treatment with immunotherapy.  Admitted with hyponatremia.  Patient reports eating most of his dinner last night of fish, broccoli and rice. Has not had anything for breakfast. Provided menu and encouraged pt to order some lunch today. Pt states he likes the Ensure supplements he is being provided. Drank at least 1/day PTA. An empty bottle of Ensure was at bedside. Pt states he was eating well until ~1 week PTA. Pt states he just lacked an appetite. Seems to be improving now this admission. Denies any swallowing or chewing issues.  Per patient, UBW is  ~180 lb. Pt's weight has remained around 180 lb over the past 2 months.   Medications: Vitamin D tablet daily Labs reviewed: CBGs: 97-145 Low Na GFR: 45   NUTRITION - FOCUSED PHYSICAL EXAM:    Most Recent Value  Orbital Region  No depletion  Upper Arm Region  No depletion  Thoracic and Lumbar Region  Unable to assess  Buccal Region  No depletion  Temple Region  Mild depletion  Clavicle Bone Region  Mild depletion  Clavicle and Acromion Bone Region  Mild depletion  Scapular Bone Region  No depletion  Dorsal Hand  No depletion  Patellar Region  Unable to assess  Anterior Thigh Region  Unable to assess  Posterior Calf Region   Unable to assess  Edema (RD Assessment)  None       Diet Order:   Diet Order            Diet heart healthy/carb modified Room service appropriate? Yes; Fluid consistency: Thin  Diet effective now              EDUCATION NEEDS:   Education needs have been addressed  Skin:  Skin Assessment: Reviewed RN Assessment  Last BM:  2/4  Height:   Ht Readings from Last 1 Encounters:  09/04/18 5\' 8"  (1.727 m)    Weight:   Wt Readings from Last 1 Encounters:  09/04/18 80.5 kg    Ideal Body Weight:  70 kg  BMI:  Body mass index is 26.98 kg/m.  Estimated Nutritional Needs:   Kcal:  2200-2400  Protein:  90-100g  Fluid:  2.2L/day   Clayton Bibles, MS, RD, LDN [facility] Pager: [phone] After Hours Pager: [phone]

## 2018-09-05 NOTE — Progress Notes (Signed)
   09/05/18 2032  Vitals  Temp 98.4 F (36.9 C)  Temp Source Oral  BP 140/81  BP Location Right Arm  BP Method Automatic  Patient Position (if appropriate) Lying  Pulse Rate (!) 114  Pulse Rate Source Dinamap  Resp 18  Oxygen Therapy  SpO2 93 %  O2 Device Nasal Cannula  O2 Flow Rate (L/min) 2 L/min  MEWS Score  MEWS RR 0  MEWS Pulse 2  MEWS Systolic 0  MEWS LOC 0  MEWS Temp 0  MEWS Score 2  MEWS Score Color Yellow  mews initiated

## 2018-09-05 NOTE — Progress Notes (Signed)
PROGRESS NOTE   MARLON SULEIMAN  IRC:[id number]    DOB: 1930/09/08    DOA: 09/04/2018  PCP: Crist Infante, MD   I have briefly reviewed patients previous medical records in [facility].  Brief Narrative:  85 year old male with PMH of metastatic renal cell carcinoma, status post left nephrectomy, on immunotherapy, last cycle approximately 3 weeks ago, stage III chronic kidney disease, COPD, DM 2, HTN, bipolar disorder, presented with ongoing low-grade fever with chills and mild dyspnea at home, decreased appetite and progressive generalized weakness to an extent where he was unable to do his daily activities.  In ED sodium 124, creatinine 1.63.  Admitted for dehydration with hyponatremia and acute on chronic kidney disease.  Improving.   Assessment & Plan:   Active Problems:   Hyponatremia   AKI (acute kidney injury) ([place])   Type 2 diabetes mellitus without complication (HCC)   Bipolar disorder (HCC)   Abnormal liver function test   1. Dehydration with hyponatremia: Suspect due to poor oral intake related to recent immunotherapy, ongoing HCTZ use and also on Zoloft which could cause hyponatremia.  HCTZ and Zoloft temporarily held.  Treating with IV normal saline, sodium has improved to 127.  Continue IV saline hydration for additional 24 hours.  Encouraged oral intake.  Although chest x-ray suggest bilateral lower lobe airspace opacities concerning for pneumonia, he does not have a clinical picture to support pneumonia in the absence of productive cough, dyspnea, leukocytosis or high fevers. 2. Hypercalcemia, mild: Likely complicated by dehydration.  Uncorrected calcium on admission was 11.4, down to 10.7.  Continue IV saline hydration and follow BMP in a.m. 3. Acute on stage III chronic kidney disease: Baseline creatinine may be in the 1.6 range.  Presented with creatinine of 1.91.  Post hydration this is improved to 1.4.  Follow-up BMP in a.m. 4. DM2 with renal complications: Hold Januvia.   Continue NovoLog SSI.  Reasonable inpatient control. 5. Essential hypertension: HCTZ held.  Continue losartan.  Controlled. 6. COPD: Stable without clinical bronchospasm. 7. Hyperlipidemia: Continue TriCor. 8. Bipolar disorder: Stable.  Continue Zyprexa 2.5 mg at bedtime. 9. Abnormal LFTs: No GI symptoms other than decreased appetite.  Unclear etiology but seem to be improving.?  Related to cancer treatment.  Outpatient follow-up. 10. Metastatic renal cell carcinoma: Outpatient follow-up with Dr. Julien Nordmann.   DVT prophylaxis: Lovenox Code Status: Full Family Communication: None at bedside Disposition: DC home pending clinical improvement   Consultants:  None  Procedures:  None  Antimicrobials:  None   Subjective: Patient reports that he feels better.  Feels stronger but still not at his baseline.  At home he is independent but in the room here has been ambulating with the help of a walker.  Feels thirsty.  Urine output has improved.  No pain reported.  No fever or chills  ROS: As above.  Objective:  Vitals:   09/04/18 2221 09/05/18 0546 09/05/18 1359 09/05/18 1426  BP:  136/65 139/87   Pulse: (!) 101 (!) 108 (!) 110   Resp: 16 17    Temp:  97.9 F (36.6 C) 98.1 F (36.7 C)   TempSrc:  Oral Oral   SpO2: 95% 91% 95% 94%  Weight:      Height:        Examination:  General exam: Pleasant elderly male, moderately built and nourished, lying comfortably propped up in bed.  Oral mucosa dry. Respiratory system: Clear to auscultation. Respiratory effort normal. Cardiovascular system: S1 & S2 heard,  RRR. No JVD, murmurs, rubs, gallops or clicks. No pedal edema. Gastrointestinal system: Abdomen is nondistended, soft and nontender. No organomegaly or masses felt. Normal bowel sounds heard. Central nervous system: Alert and oriented. No focal neurological deficits. Extremities: Symmetric 5 x 5 power. Skin: No rashes, lesions or ulcers Psychiatry: Judgement and insight appear  normal. Mood & affect appropriate.     Data Reviewed: I have personally reviewed following labs and imaging studies  CBC: Recent Labs  Lab 08/31/18 1129 09/04/18 1015 09/05/18 0640  WBC 10.1 10.8* 9.4  NEUTROABS 7.8* 8.5*  --   HGB 14.1 13.2 12.0*  HCT 41.0 39.6 36.2*  MCV 82.8 83.0 85.6  PLT 304 364 433   Basic Metabolic Panel: Recent Labs  Lab 08/31/18 1129 09/04/18 1015 09/05/18 0640  NA 125* 124* 127*  K 4.7 4.4 4.3  CL 89* 89* 94*  CO2 26 26 25   GLUCOSE 380* 176* 107*  BUN 31* 34* 28*  CREATININE 1.91* 1.63* 1.40*  CALCIUM 11.4* 11.7* 10.7*   Liver Function Tests: Recent Labs  Lab 08/31/18 1129 09/04/18 1015 09/05/18 0640  AST 137* 70* 56*  ALT 306* 200* 148*  ALKPHOS 88 59 49  BILITOT 1.0 0.9 0.7  PROT 6.9 6.9 5.8*  ALBUMIN 2.9* 3.1* 2.6*   CBG: Recent Labs  Lab 09/04/18 1831 09/04/18 1957 09/05/18 0754 09/05/18 1236 09/05/18 1715  GLUCAP 117* 145* 97 209* 101*    Recent Results (from the past 240 hour(s))  Blood culture (routine x 2)     Status: None (Preliminary result)   Collection Time: 09/04/18 10:39 AM  Result Value Ref Range Status   Specimen Description   Final    BLOOD BLOOD LEFT FOREARM Performed at [facility], [place] [address], [place], [place] [address]    Special Requests   Final    BOTTLES DRAWN AEROBIC AND ANAEROBIC Blood Culture results may not be optimal due to an excessive volume of blood received in culture bottles Performed at [facility] [address] [place], [place] [address]    Culture   Final    NO GROWTH 1 DAY Performed at [facility], [place] [address], [place], [place] [address]    Report Status PENDING  Incomplete  Urine culture     Status: None   Collection Time: 09/04/18 11:10 AM  Result Value Ref Range Status   Specimen Description   Final    URINE, RANDOM Performed at [facility] [address] [place], [place] [address]     Special Requests   Final    NONE Performed at [facility], [place] [address], [place], [place] [address]    Culture   Final    NO GROWTH Performed at [facility], [place] [address] [place], [place] [address]    Report Status 09/05/2018 FINAL  Final  Blood culture (routine x 2)     Status: None (Preliminary result)   Collection Time: 09/04/18 11:53 AM  Result Value Ref Range Status   Specimen Description   Final    BLOOD LEFT ANTECUBITAL Performed at [facility] [address] [place], [place] [address]    Special Requests   Final    BOTTLES DRAWN AEROBIC AND ANAEROBIC Blood Culture adequate volume Performed at [facility] [address], [place], [place] [address]    Culture   Final    NO GROWTH 1 DAY Performed at [facility], [place] [address] [place], [place] [address]    Report Status PENDING  Incomplete         Radiology Studies: Dg Chest 2 View  Result Date: 09/04/2018 CLINICAL DATA:  Shortness of breath EXAM: CHEST - 2 VIEW COMPARISON:  08/03/2018 FINDINGS: Patchy bilateral lower lobe airspace opacities are new since prior study concerning for pneumonia. Heart is normal size. No effusions or pneumothorax. No acute bony abnormality. IMPRESSION: Patchy bilateral lower lobe airspace opacities concerning for pneumonia. Electronically Signed   By: Rolm Baptise M.D.   On: 09/04/2018 10:24        Scheduled Meds: . cholecalciferol  1,000 Units Oral Daily  . enoxaparin (LOVENOX) injection  30 mg Subcutaneous Q24H  . feeding supplement (ENSURE ENLIVE)  237 mL Oral BID BM  . insulin aspart  0-15 Units Subcutaneous TID WC  . insulin aspart  3 Units Subcutaneous TID WC  . latanoprost  1 drop Both Eyes QHS  . losartan  100 mg Oral Daily  . OLANZapine  2.5 mg Oral QHS   Continuous Infusions: . sodium chloride 75 mL/hr at 09/05/18 1642     LOS: 1 day     Vernell Leep, MD, [place], [facility]. [facility]  To contact the attending provider between 7A-7P or the covering provider during after hours 7P-7A, please log into the web site [email or web address] and access using universal [place] password for that web site. If you do not have the password, please call the hospital operator.  09/05/2018, 5:23 PM

## 2018-09-06 ENCOUNTER — Inpatient Hospital Stay (HOSPITAL_COMMUNITY): Payer: Medicare Other

## 2018-09-06 LAB — BASIC METABOLIC PANEL
Anion gap: 8 (ref 5–15)
Anion gap: 8 (ref 5–15)
BUN: 27 mg/dL — ABNORMAL HIGH (ref 8–23)
BUN: 26 mg/dL — ABNORMAL HIGH (ref 8–23)
CO2: 22 mmol/L (ref 22–32)
CO2: 24 mmol/L (ref 22–32)
Calcium: 10.5 mg/dL — ABNORMAL HIGH (ref 8.9–10.3)
Calcium: 10.2 mg/dL (ref 8.9–10.3)
Chloride: 93 mmol/L — ABNORMAL LOW (ref 98–111)
Chloride: 90 mmol/L — ABNORMAL LOW (ref 98–111)
Creatinine, Ser: 1.38 mg/dL — ABNORMAL HIGH (ref 0.61–1.24)
Creatinine, Ser: 1.47 mg/dL — ABNORMAL HIGH (ref 0.61–1.24)
GFR calc Af Amer: 54 mL/min — ABNORMAL LOW (ref >60)
GFR calc non Af Amer: 46 mL/min — ABNORMAL LOW (ref >60)
GFR calc non Af Amer: 43 mL/min — ABNORMAL LOW (ref >60)
GFR, EST AFRICAN AMERICAN: 50 mL/min — AB (ref >60)
Glucose, Bld: 142 mg/dL — ABNORMAL HIGH (ref 70–99)
Glucose, Bld: 229 mg/dL — ABNORMAL HIGH (ref 70–99)
Potassium: 4.4 mmol/L (ref 3.5–5.1)
Potassium: 4.2 mmol/L (ref 3.5–5.1)
Sodium: 123 mmol/L — ABNORMAL LOW (ref 135–145)
Sodium: 122 mmol/L — ABNORMAL LOW (ref 135–145)

## 2018-09-06 LAB — SODIUM, URINE, RANDOM: Sodium, Ur: 77 mmol/L

## 2018-09-06 LAB — GLUCOSE, CAPILLARY
Glucose-Capillary: 137 mg/dL — ABNORMAL HIGH (ref 70–99)
Glucose-Capillary: 226 mg/dL — ABNORMAL HIGH (ref 70–99)
Glucose-Capillary: 215 mg/dL — ABNORMAL HIGH (ref 70–99)
Glucose-Capillary: 193 mg/dL — ABNORMAL HIGH (ref 70–99)

## 2018-09-06 LAB — OSMOLALITY, URINE: OSMOLALITY UR: 333 mosm/kg (ref 300–900)

## 2018-09-06 LAB — PROCALCITONIN: Procalcitonin: 0.1 ng/mL

## 2018-09-06 LAB — OSMOLALITY: Osmolality: 261 mOsm/kg — ABNORMAL LOW (ref 275–295)

## 2018-09-06 MED ORDER — HYDRALAZINE HCL 20 MG/ML IJ SOLN: 10.0000 mg | Freq: Four times a day (QID) | INTRAMUSCULAR | Status: DC | PRN

## 2018-09-06 MED ORDER — FUROSEMIDE 20 MG PO TABS
20.0000 mg | ORAL_TABLET | Freq: Every day | ORAL | Status: DC
  Administered 2018-09-06 – 2018-09-07 (×2): 20 mg via ORAL
  Filled 2018-09-06 (×2): qty 1

## 2018-09-06 MED ORDER — ENOXAPARIN SODIUM 40 MG/0.4ML ~~LOC~~ SOLN
40.0000 mg | SUBCUTANEOUS | Status: DC
  Administered 2018-09-06 – 2018-09-07 (×2): 40 mg via SUBCUTANEOUS
  Filled 2018-09-06 (×2): qty 0.4

## 2018-09-06 NOTE — Progress Notes (Signed)
PROGRESS NOTE   Blake Hines  RSW:[id number]    DOB: 05-Sep-1930    DOA: 09/04/2018  PCP: Blake Infante, MD   I have briefly reviewed patients previous medical records in [facility]: Blake Rhonda W.  Brief Narrative:  85 year old male with PMH of metastatic renal cell carcinoma, status post left nephrectomy, on immunotherapy, last cycle approximately 3 weeks ago, stage III chronic kidney disease, COPD, DM 2, HTN, bipolar disorder, presented with ongoing low-grade fever with chills and mild dyspnea at home, decreased appetite and progressive generalized weakness to an extent where he was unable to do his daily activities.  In ED sodium 124, creatinine 1.63.  Admitted for dehydration with hyponatremia and acute on chronic kidney disease.  Improving.   Assessment & Plan:   Active Problems:   Hyponatremia   AKI (acute kidney injury) ([place])   Type 2 diabetes mellitus without complication (HCC)   Bipolar disorder (HCC)   Abnormal liver function test   1. Dehydration with hyponatremia: Suspect due to poor oral intake related to recent immunotherapy, ongoing HCTZ use and also on Zoloft which could cause hyponatremia.  HCTZ and Zoloft temporarily held.  Treated initially with IV normal saline, sodium had improved to 127 but subsequently dropped to 123, 122.  Although chest x-ray suggest bilateral lower lobe airspace opacities concerning for pneumonia, he does not have a clinical picture to support pneumonia in the absence of productive cough, dyspnea, leukocytosis or high fevers.  Clinically appears euvolemic at this time.  Repeat chest x-ray personally reviewed and no changes.  Procalcitonin negative. 2. Suspected SIADH: Sodium changes as noted above.  Serum osmolarity 261, urine osmolarity 333 and urine sodium 77.  Discontinued IV fluids.  Fluid restriction of 1.2 L/day.  Started Lasix 20 mg orally daily.  Strict intake output and follow BMP in a.m.  She does have chronic hyponatremia and sodiums in the mid 120s  lately. 3. Hypercalcemia, mild: Likely complicated by dehydration.  Uncorrected calcium on admission was 11.4, down to 10.2.  Improved. 4. Acute on stage III chronic kidney disease: Baseline creatinine may be in the 1.6 range.  Presented with creatinine of 1.91.  Post hydration this is improved to 1.4.  Follow-up BMP in a.m. 5. DM2 with renal complications: Hold Januvia.  Continue NovoLog SSI.  Mildly uncontrolled and fluctuating at times.  Continue current regimen without change. 6. Essential hypertension: HCTZ discontinued indefinitely.  Losartan held due to SIADH.  PRN IV hydralazine. 7. COPD: Stable without clinical bronchospasm. 8. Hyperlipidemia: Continue TriCor. 9. Bipolar disorder: Stable.  Continue Zyprexa 2.5 mg at bedtime. 10. Abnormal LFTs: No GI symptoms other than decreased appetite.  Unclear etiology but seem to be improving.?  Related to cancer treatment.  Outpatient follow-up. 11. Metastatic renal cell carcinoma: Outpatient follow-up with Dr. Julien Hines.   DVT prophylaxis: Lovenox Code Status: Full Family Communication: None at bedside Disposition: DC home pending clinical improvement   Consultants:  None  Procedures:  None  Antimicrobials:  None   Subjective: As per RN, overnight some dyspnea and tachycardia but patient does not recollect.  Denies dyspnea or cough.  Poor historian.  Feels stronger.  ROS: As above.  Objective:  Vitals:   09/06/18 0454 09/06/18 0859 09/06/18 0948 09/06/18 1527  BP: (!) 144/77 (!) 152/93 138/90 (!) 156/91  Pulse: (!) 105 (!) 103 (!) 110 (!) 109  Resp: 20 (!) 22 16 20   Temp: 98.4 F (36.9 C) (!) 97.5 F (36.4 C) (!) 97.5 F (36.4 C) 98.1 F (36.7  C)  TempSrc: Oral Oral Oral Oral  SpO2: 93% 93% 93% 96%  Weight:      Height:        Examination:  General exam: Pleasant elderly male, moderately built and nourished, lying comfortably propped up in bed.  Oral mucosa moist Respiratory system: Slightly diminished breath sounds  in the bases with occasional basal crackles but otherwise clear to auscultation. Cardiovascular system: S1 & S2 heard, RRR. No JVD, murmurs, rubs, gallops or clicks. No pedal edema.  Telemetry personally reviewed: Mostly ST in the 100s with BBB morphology.  Transient ST in the 140s overnight. Gastrointestinal system: Abdomen is nondistended, soft and nontender. No organomegaly or masses felt. Normal bowel sounds heard.  Stable Central nervous system: Alert and oriented person and place and partly to time. No focal neurological deficits. Extremities: Symmetric 5 x 5 power. Skin: No rashes, lesions or ulcers Psychiatry: Judgement and insight appear impaired. Mood & affect appropriate.     Data Reviewed: I have personally reviewed following labs and imaging studies  CBC: Recent Labs  Lab 08/31/18 1129 09/04/18 1015 09/05/18 0640  WBC 10.1 10.8* 9.4  NEUTROABS 7.8* 8.5*  --   HGB 14.1 13.2 12.0*  HCT 41.0 39.6 36.2*  MCV 82.8 83.0 85.6  PLT 304 364 564   Basic Metabolic Panel: Recent Labs  Lab 08/31/18 1129 09/04/18 1015 09/05/18 0640 09/06/18 0559 09/06/18 1144  NA 125* 124* 127* 123* 122*  K 4.7 4.4 4.3 4.4 4.2  CL 89* 89* 94* 93* 90*  CO2 26 26 25 22 24   GLUCOSE 380* 176* 107* 142* 229*  BUN 31* 34* 28* 27* 26*  CREATININE 1.91* 1.63* 1.40* 1.38* 1.47*  CALCIUM 11.4* 11.7* 10.7* 10.5* 10.2   Liver Function Tests: Recent Labs  Lab 08/31/18 1129 09/04/18 1015 09/05/18 0640  AST 137* 70* 56*  ALT 306* 200* 148*  ALKPHOS 88 59 49  BILITOT 1.0 0.9 0.7  PROT 6.9 6.9 5.8*  ALBUMIN 2.9* 3.1* 2.6*   CBG: Recent Labs  Lab 09/05/18 1236 09/05/18 1715 09/05/18 2035 09/06/18 0814 09/06/18 1143  GLUCAP 209* 101* 202* 137* 226*    Recent Results (from the past 240 hour(s))  Blood culture (routine x 2)     Status: None (Preliminary result)   Collection Time: 09/04/18 10:39 AM  Result Value Ref Range Status   Specimen Description   Final    BLOOD BLOOD LEFT  FOREARM Performed at [facility], [place] [address] [place], [place] [address]    Special Requests   Final    BOTTLES DRAWN AEROBIC AND ANAEROBIC Blood Culture results may not be optimal due to an excessive volume of blood received in culture bottles Performed at [facility] [address], [place], [place] [address]    Culture   Final    NO GROWTH 2 DAYS Performed at [facility] [address] [place], [place] [address]    Report Status PENDING  Incomplete  Urine culture     Status: None   Collection Time: 09/04/18 11:10 AM  Result Value Ref Range Status   Specimen Description   Final    URINE, RANDOM Performed at [facility] [address], [place], [place] [address]    Special Requests   Final    NONE Performed at [facility], [place] [address] [place], [place] [address]    Culture   Final    NO GROWTH Performed at [facility], [place] [address], [place], [place] [address]    Report Status 09/05/2018 FINAL  Final  Blood culture (routine x 2)     Status: None (Preliminary result)   Collection Time: 09/04/18 11:53 AM  Result Value Ref Range Status   Specimen Description   Final    BLOOD LEFT ANTECUBITAL Performed at [facility] [address] [place], [place] [address]    Special Requests   Final    BOTTLES DRAWN AEROBIC AND ANAEROBIC Blood Culture adequate volume Performed at [facility] [address], [place], [place] [address]    Culture   Final    NO GROWTH 2 DAYS Performed at [facility] [address] [place], [place] [address]    Report Status PENDING  Incomplete         Radiology Studies: Dg Chest Port 1 View  Result Date: 09/06/2018 CLINICAL DATA:  Shortness of breath EXAM: PORTABLE CHEST 1 VIEW COMPARISON:  Two days ago FINDINGS: Asymmetric left lower lung opacity compatible with pneumonia. Pulmonary metastatic  disease based on recent PET-CT. COPD. Normal heart size. Aortic tortuosity. IMPRESSION: 1. Unchanged asymmetric left lower pulmonary opacity primarily concerning for pneumonia. 2. Pulmonary metastatic disease. Electronically Signed   By: Monte Fantasia M.D.   On: 09/06/2018 10:31        Scheduled Meds: . cholecalciferol  1,000 Units Oral Daily  . enoxaparin (LOVENOX) injection  40 mg Subcutaneous Q24H  . feeding supplement (ENSURE ENLIVE)  237 mL Oral BID BM  . furosemide  20 mg Oral Daily  . insulin aspart  0-5 Units Subcutaneous QHS  . insulin aspart  0-9 Units Subcutaneous TID WC  . latanoprost  1 drop Both Eyes QHS  . OLANZapine  2.5 mg Oral QHS   Continuous Infusions:    LOS: 2 days     Vernell Leep, MD, FACP, [facility]. [facility]  To contact the attending provider between 7A-7P or the covering provider during after hours 7P-7A, please log into the web site [email or web address] and access using universal [place] password for that web site. If you do not have the password, please call the hospital operator.  09/06/2018, 3:59 PM

## 2018-09-07 ENCOUNTER — Inpatient Hospital Stay (HOSPITAL_COMMUNITY): Payer: Medicare Other

## 2018-09-07 LAB — COMPREHENSIVE METABOLIC PANEL
ALT: 136 U/L — AB (ref 0–44)
AST: 55 U/L — ABNORMAL HIGH (ref 15–41)
Albumin: 2.6 g/dL — ABNORMAL LOW (ref 3.5–5.0)
Alkaline Phosphatase: 47 U/L (ref 38–126)
Anion gap: 8 (ref 5–15)
BUN: 29 mg/dL — ABNORMAL HIGH (ref 8–23)
CO2: 27 mmol/L (ref 22–32)
CREATININE: 1.43 mg/dL — AB (ref 0.61–1.24)
Calcium: 10.7 mg/dL — ABNORMAL HIGH (ref 8.9–10.3)
Chloride: 90 mmol/L — ABNORMAL LOW (ref 98–111)
GFR calc Af Amer: 51 mL/min — ABNORMAL LOW (ref >60)
GFR, EST NON AFRICAN AMERICAN: 44 mL/min — AB (ref >60)
Glucose, Bld: 166 mg/dL — ABNORMAL HIGH (ref 70–99)
Potassium: 4.3 mmol/L (ref 3.5–5.1)
Sodium: 125 mmol/L — ABNORMAL LOW (ref 135–145)
Total Bilirubin: 0.8 mg/dL (ref 0.3–1.2)
Total Protein: 5.9 g/dL — ABNORMAL LOW (ref 6.5–8.1)

## 2018-09-07 LAB — TSH: TSH: 10.91 u[IU]/mL — ABNORMAL HIGH (ref 0.350–4.500)

## 2018-09-07 LAB — GLUCOSE, CAPILLARY
Glucose-Capillary: 175 mg/dL — ABNORMAL HIGH (ref 70–99)
Glucose-Capillary: 330 mg/dL — ABNORMAL HIGH (ref 70–99)
Glucose-Capillary: 217 mg/dL — ABNORMAL HIGH (ref 70–99)
Glucose-Capillary: 235 mg/dL — ABNORMAL HIGH (ref 70–99)

## 2018-09-07 LAB — D-DIMER, QUANTITATIVE: D-Dimer, Quant: 3.74 ug/mL-FEU — ABNORMAL HIGH (ref 0.00–0.50)

## 2018-09-07 MED ORDER — TECHNETIUM TO 99M ALBUMIN AGGREGATED
4.2000 | Freq: Once | INTRAVENOUS | Status: AC
  Administered 2018-09-07: 4.2 via INTRAVENOUS

## 2018-09-07 MED ORDER — TECHNETIUM TC 99M DIETHYLENETRIAME-PENTAACETIC ACID
30.2000 | Freq: Once | INTRAVENOUS | Status: AC
  Administered 2018-09-07: 30.2 via RESPIRATORY_TRACT

## 2018-09-07 NOTE — Progress Notes (Signed)
Physical Therapy Treatment Patient Details Name: Blake Hines MRN: [id number] DOB: September 14, 1930 Today's Date: 09/07/2018    History of Present Illness Patient is 85 year old male with metastatic renal cell carcinoma currently undergoing palliative immunotherapy treatment.  Pt admitted with hyponatremia.    PT Comments    Pt requiring at least 4L O2 [place] for ambulating (SPO2 down to 86%) and HR elevated in 140s.  RN in hallway and aware.   Follow Up Recommendations  Home health PT;Supervision for mobility/OOB     Equipment Recommendations  Rolling walker with 5" wheels    Recommendations for Other Services       Precautions / Restrictions Precautions Precautions: Fall Precaution Comments: monitor o2 sats and HR    Mobility  Bed Mobility Overal bed mobility: Needs Assistance Bed Mobility: Supine to Sit;Sit to Supine     Supine to sit: Supervision Sit to supine: Supervision      Transfers Overall transfer level: Needs assistance Equipment used: Rolling walker (2 wheeled) Transfers: Sit to/from Stand Sit to Stand: Min guard         General transfer comment: verbal cues for hand placement  Ambulation/Gait Ambulation/Gait assistance: Min guard Gait Distance (Feet): 180 Feet Assistive device: Rolling walker (2 wheeled) Gait Pattern/deviations: Step-through pattern;Decreased stride length     General Gait Details: verbal cues for safe use of RW (pt does not typically use), SPO2 dropped to 86% on 4L O2 [place] and HR in 140s during ambulation, one standing rest break cued, RN in hallway and aware   Stairs             Wheelchair Mobility    Modified Rankin (Stroke Patients Only)       Balance                                            Cognition Arousal/Alertness: Awake/alert Behavior During Therapy: WFL for tasks assessed/performed Overall Cognitive Status: Within Functional Limits for tasks assessed                                         Exercises      General Comments        Pertinent Vitals/Pain Pain Assessment: No/denies pain    Home Living                      Prior Function            PT Goals (current goals can now be found in the care plan section) Progress towards PT goals: Progressing toward goals    Frequency    Min 3X/week      PT Plan Current plan remains appropriate    Co-evaluation              AM-PAC PT "6 Clicks" Mobility   Outcome Measure  Help needed turning from your back to your side while in a flat bed without using bedrails?: None Help needed moving from lying on your back to sitting on the side of a flat bed without using bedrails?: A Little Help needed moving to and from a bed to a chair (including a wheelchair)?: A Little Help needed standing up from a chair using your arms (e.g., wheelchair or bedside chair)?: A Little Help needed to walk  in hospital room?: A Little Help needed climbing 3-5 steps with a railing? : A Little 6 Click Score: 19    End of Session Equipment Utilized During Treatment: Gait belt Activity Tolerance: Patient tolerated treatment well Patient left: in bed;with call bell/phone within reach;with bed alarm set;with family/visitor present Nurse Communication: Mobility status PT Visit Diagnosis: Difficulty in walking, not elsewhere classified (R26.2);Muscle weakness (generalized) (M62.81)     Time: 4580-9983 PT Time Calculation (min) (ACUTE ONLY): 21 min  Charges:  $Gait Training: 8-22 mins                     Carmelia Bake, PT, [place] Office: [phone] Pager: [phone]  Trena Platt 09/07/2018, 2:29 PM

## 2018-09-07 NOTE — Progress Notes (Addendum)
PROGRESS NOTE   Blake Hines  UYQ:[id number]    DOB: 1930-12-09    DOA: 09/04/2018  PCP: Crist Infante, MD   I have briefly reviewed patients previous medical records in [facility]  Brief Narrative:  85 year old male with PMH of metastatic renal cell carcinoma, status post left nephrectomy, on immunotherapy, last cycle approximately 3 weeks ago, stage III chronic kidney disease, COPD, DM 2, HTN, bipolar disorder, presented with ongoing low-grade fever with chills and mild dyspnea at home, decreased appetite and progressive generalized weakness to an extent where he was unable to do his daily activities.  In ED sodium 124, creatinine 1.63.  Admitted for dehydration with hyponatremia and acute on chronic kidney disease.  Improving.   Assessment & Plan:   Active Problems:   Hyponatremia   AKI (acute kidney injury) ([place])   Type 2 diabetes mellitus without complication (HCC)   Bipolar disorder (HCC)   Abnormal liver function test   1. Dehydration with hyponatremia: Suspect due to poor oral intake related to recent immunotherapy, ongoing HCTZ use and also on Zoloft which could cause hyponatremia.  HCTZ and Zoloft temporarily held.  Treated initially with IV normal saline, sodium had improved to 127 but subsequently dropped to 123, 122.  Although chest x-ray suggest bilateral lower lobe airspace opacities concerning for pneumonia, he does not have a clinical picture to support pneumonia in the absence of productive cough, dyspnea, leukocytosis or high fevers.  Clinically appears euvolemic at this time.  Repeat chest x-ray personally reviewed and no changes.  Procalcitonin negative. 2. Suspected SIADH: Sodium changes as noted above.  Serum osmolarity 261, urine osmolarity 333 and urine sodium 77.  Discontinued IV fluids.  Fluid restriction of 1.2 L/day.  Started Lasix 20 mg orally daily.  Strict intake output and follow BMP in a.m.  She does have chronic hyponatremia and sodiums in the mid 120s  lately.  Sodium improved to 127.  Continue management. 3. Acute respiratory failure with hypoxia: Unclear etiology.?  Related to COPD, left lower pulmonary opacity, pulmonary metastasis versus r/o PE given history of malignancy and tachycardia on telemetry. Check d-dimer and if positive then follow-up with VQ scan. 4. Hypercalcemia, mild: Likely complicated by dehydration.  Uncorrected calcium on admission was 11.4, down to 10.2.  Improved. 5. Acute on stage III chronic kidney disease: Baseline creatinine may be in the 1.6 range.  Presented with creatinine of 1.91.  Post hydration this is improved to 1.4.  Stable. 6. DM2 with renal complications: Hold Januvia.  Continue NovoLog SSI.  Mildly uncontrolled and fluctuating at times.  Continue current regimen without change. 7. Essential hypertension: HCTZ discontinued indefinitely.  Losartan held due to SIADH.  PRN IV hydralazine.  Started amlodipine 2.5 mg daily. 8. COPD: Stable without clinical bronchospasm. 9. Hyperlipidemia: Continue TriCor. 10. Bipolar disorder: Stable.  Continue Zyprexa 2.5 mg at bedtime. 11. Abnormal LFTs: No GI symptoms other than decreased appetite.  Unclear etiology but seem to be improving.?  Related to cancer treatment.  Outpatient follow-up. 12. Metastatic renal cell carcinoma: Outpatient follow-up with Dr. Julien Nordmann. 13. ST: unclear etiology.  Asymptomatic.  Please see discussion above.  Check TSH.   DVT prophylaxis: Lovenox Code Status: Full Family Communication: None at bedside.  Patient does not wish for me to call his son who lives in [place].  He gave permission for me to call and speak with his daughter who is in [place] but is supposed to come in on Monday but has a [place] phone  number that she can be reached at.  I attempted without success. Disposition: DC home pending clinical improvement   Consultants:  None  Procedures:  None  Antimicrobials:  None   Subjective: States that he wants to get up and  ambulate.  Denies dyspnea, chest pain or cough again.  No new complaints reported.  ROS: As above.  Objective:  Vitals:   09/07/18 1030 09/07/18 1356 09/07/18 1400 09/07/18 1439  BP:  (!) 157/105  130/82  Pulse:  (!) 123 (!) 120 (!) 118  Resp:  20  20  Temp:  97.6 F (36.4 C)    TempSrc:  Oral    SpO2: 90% 92%    Weight:      Height:        Examination:  General exam: Pleasant elderly male, moderately built and nourished, lying comfortably propped up in bed.  Oral mucosa moist Respiratory system: Slightly diminished breath sounds in the bases with occasional basal crackles but otherwise clear to auscultation.  Stable without change. Cardiovascular system: S1 & S2 heard, RRR. No JVD, murmurs, rubs, gallops or clicks. No pedal edema.  Telemetry personally reviewed: Sinus tachycardia in the 100s-120s. Gastrointestinal system: Abdomen is nondistended, soft and nontender. No organomegaly or masses felt. Normal bowel sounds heard.  Stable Central nervous system: Alert and oriented person and place and partly to time. No focal neurological deficits. Extremities: Symmetric 5 x 5 power. Skin: No rashes, lesions or ulcers Psychiatry: Judgement and insight appear impaired. Mood & affect appropriate.     Data Reviewed: I have personally reviewed following labs and imaging studies  CBC: Recent Labs  Lab 09/04/18 1015 09/05/18 0640  WBC 10.8* 9.4  NEUTROABS 8.5*  --   HGB 13.2 12.0*  HCT 39.6 36.2*  MCV 83.0 85.6  PLT 364 428   Basic Metabolic Panel: Recent Labs  Lab 09/04/18 1015 09/05/18 0640 09/06/18 0559 09/06/18 1144 09/07/18 0609  NA 124* 127* 123* 122* 125*  K 4.4 4.3 4.4 4.2 4.3  CL 89* 94* 93* 90* 90*  CO2 26 25 22 24 27   GLUCOSE 176* 107* 142* 229* 166*  BUN 34* 28* 27* 26* 29*  CREATININE 1.63* 1.40* 1.38* 1.47* 1.43*  CALCIUM 11.7* 10.7* 10.5* 10.2 10.7*   Liver Function Tests: Recent Labs  Lab 09/04/18 1015 09/05/18 0640 09/07/18 0609  AST 70* 56*  55*  ALT 200* 148* 136*  ALKPHOS 59 49 47  BILITOT 0.9 0.7 0.8  PROT 6.9 5.8* 5.9*  ALBUMIN 3.1* 2.6* 2.6*   CBG: Recent Labs  Lab 09/06/18 1654 09/06/18 2210 09/07/18 0739 09/07/18 1113 09/07/18 1643  GLUCAP 215* 193* 175* 330* 217*    Recent Results (from the past 240 hour(s))  Blood culture (routine x 2)     Status: None (Preliminary result)   Collection Time: 09/04/18 10:39 AM  Result Value Ref Range Status   Specimen Description   Final    BLOOD BLOOD LEFT FOREARM Performed at [facility], [place] [address] [place], [place] [address]    Special Requests   Final    BOTTLES DRAWN AEROBIC AND ANAEROBIC Blood Culture results may not be optimal due to an excessive volume of blood received in culture bottles Performed at [facility] [address], [place], [place] [address]    Culture   Final    NO GROWTH 3 DAYS Performed at [facility], [place] [address] [place], [place] [address]    Report Status PENDING  Incomplete  Urine culture     Status: None   Collection Time: 09/04/18 11:10 AM  Result Value Ref Range Status   Specimen Description   Final    URINE, RANDOM Performed at [facility] [address], [place], [place] [address]    Special Requests   Final    NONE Performed at [facility], [place] [address] [place], [place] [address]    Culture   Final    NO GROWTH Performed at [facility], [place] [address], [place], [place] [address]    Report Status 09/05/2018 FINAL  Final  Blood culture (routine x 2)     Status: None (Preliminary result)   Collection Time: 09/04/18 11:53 AM  Result Value Ref Range Status   Specimen Description   Final    BLOOD LEFT ANTECUBITAL Performed at [facility] [address] [place], [place] [address]    Special Requests   Final    BOTTLES DRAWN AEROBIC AND ANAEROBIC Blood Culture adequate volume Performed at [facility] [address] [place], [place] [address]    Culture   Final    NO GROWTH 3 DAYS Performed at [facility], [place] [address], [place], [place] [address]    Report Status PENDING  Incomplete         Radiology Studies: Dg Chest Port 1 View  Result Date: 09/06/2018 CLINICAL DATA:  Shortness of breath EXAM: PORTABLE CHEST 1 VIEW COMPARISON:  Two days ago FINDINGS: Asymmetric left lower lung opacity compatible with pneumonia. Pulmonary metastatic disease based on recent PET-CT. COPD. Normal heart size. Aortic tortuosity. IMPRESSION: 1. Unchanged asymmetric left lower pulmonary opacity primarily concerning for pneumonia. 2. Pulmonary metastatic disease. Electronically Signed   By: Monte Fantasia M.D.   On: 09/06/2018 10:31        Scheduled Meds: . cholecalciferol  1,000 Units Oral Daily  . enoxaparin (LOVENOX) injection  40 mg Subcutaneous Q24H  . feeding supplement (ENSURE ENLIVE)  237 mL Oral BID BM  . furosemide  20 mg Oral Daily  . insulin aspart  0-5 Units Subcutaneous QHS  . insulin aspart  0-9 Units Subcutaneous TID WC  . latanoprost  1 drop Both Eyes QHS  . OLANZapine  2.5 mg Oral QHS   Continuous Infusions:    LOS: 3 days     Vernell Leep, MD, FACP, [facility]. [facility]  To contact the attending provider between 7A-7P or the covering provider during after hours 7P-7A, please log into the web site [email or web address] and access using universal [place] password for that web site. If you do not have the password, please call the hospital operator.  09/07/2018, 4:56 PM

## 2018-09-07 NOTE — Progress Notes (Signed)
SATURATION QUALIFICATIONS: (This note is used to comply with regulatory documentation for home oxygen)  Patient Saturations on Room Air at Rest = 88 %  Patient Saturations on Room Air while Ambulating = 76%  Patient Saturations on 2 Liters of oxygen while Ambulating = 84-86% Patient Saturations on 3Liters of oxygen while Ambulating = 90-92%

## 2018-09-07 NOTE — Progress Notes (Signed)
ANTICOAGULATION CONSULT NOTE - Initial Consult  Pharmacy Consult for IV heparin Indication: pulmonary embolus  No Known Allergies  Patient Measurements: Height: 5\' 8"  (172.7 cm) Weight: 177 lb 7.5 oz (80.5 kg) IBW/kg (Calculated) : 68.4 Heparin Dosing Weight: actual  Vital Signs: Temp: 98.5 F (36.9 C) (02/07 2008) Temp Source: Oral (02/07 2008) BP: 165/99 (02/07 2008) Pulse Rate: 126 (02/07 2008)  Labs: Recent Labs    09/05/18 0640 09/06/18 0559 09/06/18 1144 09/07/18 0609  HGB 12.0*  --   --   --   HCT 36.2*  --   --   --   PLT 317  --   --   --   CREATININE 1.40* 1.38* 1.47* 1.43*    Estimated Creatinine Clearance: 36.5 mL/min (A) (by C-G formula based on SCr of 1.43 mg/dL (H)).   Medical History: Past Medical History:  Diagnosis Date  . Bipolar 1 disorder ([place])   . BPH (benign prostatic hyperplasia)   . Cancer ([place])    RCC  . Cataract   . Chronic kidney disease   . COPD (chronic obstructive pulmonary disease) ([place])   . Depression   . Diabetes mellitus without complication ([place])   . Hypertension     Medications:  Scheduled:  . cholecalciferol  1,000 Units Oral Daily  . feeding supplement (ENSURE ENLIVE)  237 mL Oral BID BM  . furosemide  20 mg Oral Daily  . insulin aspart  0-5 Units Subcutaneous QHS  . insulin aspart  0-9 Units Subcutaneous TID WC  . latanoprost  1 drop Both Eyes QHS  . OLANZapine  2.5 mg Oral QHS   Infusions:    Assessment: 15 yoM admitted 2/4 with generalized weakness, hx of metastatic renal cell carcinoma now with elevated d-dimer and intermediate probability for PE per perfusion lung scan.  Baseline labs: H/H 12/36.2, Plts = 317, aptt=45, INR=1.19. d=dimer=3.74  Goal of Therapy:  Heparin level 0.3-0.7 units/ml Monitor platelets by anticoagulation protocol: Yes   Plan:  Baseline aptt and PT/INR STAT Give 1000 units IV bolus x1 (reduced d/t 40 mg Lovenox given 2308) Start drip at 1300 units/hr Daily CBC/HL Check 1st  HL in 8 hours   Dorrene German 09/07/2018,11:52 PM

## 2018-09-07 NOTE — Progress Notes (Signed)
Patient walked with PT. Patient is alert and oriented x 4. HR and BP elevated. RN will recheck in 30 minutes.

## 2018-09-07 NOTE — Care Management Important Message (Signed)
Important Message  Patient Details  Name: TEL HEVIA MRN: [id number] Date of Birth: 1931-03-07   Medicare Important Message Given:  Yes    Kerin Salen 09/07/2018, 11:04 AMImportant Message  Patient Details  Name: KAZUMA ELENA MRN: [id number] Date of Birth: 11-02-1930   Medicare Important Message Given:  Yes    Kerin Salen 09/07/2018, 11:04 AM

## 2018-09-07 NOTE — Progress Notes (Signed)
Wean off oxygen as MD ordered. Patient's spO2 at rest room air = 84%. Put patient back on 2L; spO2 = 88%

## 2018-09-08 ENCOUNTER — Inpatient Hospital Stay (HOSPITAL_COMMUNITY): Payer: Medicare Other

## 2018-09-08 ENCOUNTER — Encounter (HOSPITAL_COMMUNITY): Payer: Self-pay | Admitting: *Deleted

## 2018-09-08 DIAGNOSIS — J189 Pneumonia, unspecified organism: Secondary | ICD-10-CM

## 2018-09-08 DIAGNOSIS — I2699 Other pulmonary embolism without acute cor pulmonale: Secondary | ICD-10-CM

## 2018-09-08 DIAGNOSIS — J9601 Acute respiratory failure with hypoxia: Secondary | ICD-10-CM

## 2018-09-08 DIAGNOSIS — R0902 Hypoxemia: Secondary | ICD-10-CM

## 2018-09-08 DIAGNOSIS — N183 Chronic kidney disease, stage 3 (moderate): Secondary | ICD-10-CM

## 2018-09-08 DIAGNOSIS — R0602 Shortness of breath: Secondary | ICD-10-CM

## 2018-09-08 LAB — HEPARIN LEVEL (UNFRACTIONATED)
Heparin Unfractionated: 0.22 IU/mL — ABNORMAL LOW (ref 0.30–0.70)
Heparin Unfractionated: 0.28 IU/mL — ABNORMAL LOW (ref 0.30–0.70)

## 2018-09-08 LAB — BASIC METABOLIC PANEL
Anion gap: 9 (ref 5–15)
BUN: 29 mg/dL — ABNORMAL HIGH (ref 8–23)
CHLORIDE: 84 mmol/L — AB (ref 98–111)
CO2: 27 mmol/L (ref 22–32)
Calcium: 10.5 mg/dL — ABNORMAL HIGH (ref 8.9–10.3)
Creatinine, Ser: 1.35 mg/dL — ABNORMAL HIGH (ref 0.61–1.24)
GFR calc Af Amer: 55 mL/min — ABNORMAL LOW (ref >60)
GFR calc non Af Amer: 48 mL/min — ABNORMAL LOW (ref >60)
GLUCOSE: 177 mg/dL — AB (ref 70–99)
Potassium: 4.2 mmol/L (ref 3.5–5.1)
Sodium: 120 mmol/L — ABNORMAL LOW (ref 135–145)

## 2018-09-08 LAB — T4, FREE: FREE T4: 1.48 ng/dL (ref 0.82–1.77)

## 2018-09-08 LAB — ECHOCARDIOGRAM COMPLETE
Height: 68 in
Weight: 2839.52 oz

## 2018-09-08 LAB — GLUCOSE, CAPILLARY
GLUCOSE-CAPILLARY: 139 mg/dL — AB (ref 70–99)
Glucose-Capillary: 174 mg/dL — ABNORMAL HIGH (ref 70–99)
Glucose-Capillary: 213 mg/dL — ABNORMAL HIGH (ref 70–99)
Glucose-Capillary: 222 mg/dL — ABNORMAL HIGH (ref 70–99)

## 2018-09-08 LAB — CBC
HCT: 37.7 % — ABNORMAL LOW (ref 39.0–52.0)
Hemoglobin: 12.9 g/dL — ABNORMAL LOW (ref 13.0–17.0)
MCH: 28.6 pg (ref 26.0–34.0)
MCHC: 34.2 g/dL (ref 30.0–36.0)
MCV: 83.6 fL (ref 80.0–100.0)
NRBC: 0 % (ref 0.0–0.2)
Platelets: 335 10*3/uL (ref 150–400)
RBC: 4.51 MIL/uL (ref 4.22–5.81)
RDW: 12.3 % (ref 11.5–15.5)
WBC: 10.6 10*3/uL — ABNORMAL HIGH (ref 4.0–10.5)

## 2018-09-08 LAB — APTT: aPTT: 45 seconds — ABNORMAL HIGH (ref 24–36)

## 2018-09-08 LAB — PROCALCITONIN: Procalcitonin: 0.15 ng/mL

## 2018-09-08 LAB — PROTIME-INR
INR: 1.19
Prothrombin Time: 15 seconds (ref 11.4–15.2)

## 2018-09-08 LAB — SODIUM, URINE, RANDOM: SODIUM UR: 46 mmol/L

## 2018-09-08 MED ORDER — SODIUM CHLORIDE 0.9 % IV SOLN
INTRAVENOUS | Status: AC
  Administered 2018-09-08: 19:00:00 via INTRAVENOUS

## 2018-09-08 MED ORDER — AMLODIPINE BESYLATE 5 MG PO TABS
2.5000 mg | ORAL_TABLET | Freq: Every day | ORAL | Status: DC
  Administered 2018-09-08 – 2018-09-11 (×4): 2.5 mg via ORAL
  Filled 2018-09-08 (×4): qty 1

## 2018-09-08 MED ORDER — HEPARIN (PORCINE) 25000 UT/250ML-% IV SOLN
1300.0000 [IU]/h | INTRAVENOUS | Status: DC
  Administered 2018-09-08: 1300 [IU]/h via INTRAVENOUS
  Filled 2018-09-08 (×2): qty 250

## 2018-09-08 MED ORDER — SODIUM CHLORIDE (PF) 0.9 % IJ SOLN
INTRAMUSCULAR | Status: AC
  Filled 2018-09-08: qty 100

## 2018-09-08 MED ORDER — HEPARIN BOLUS VIA INFUSION
1000.0000 [IU] | Freq: Once | INTRAVENOUS | Status: AC
  Administered 2018-09-08: 1000 [IU] via INTRAVENOUS
  Filled 2018-09-08: qty 1000

## 2018-09-08 MED ORDER — HEPARIN (PORCINE) 25000 UT/250ML-% IV SOLN
1700.0000 [IU]/h | INTRAVENOUS | Status: DC
  Administered 2018-09-08: 1550 [IU]/h via INTRAVENOUS
  Filled 2018-09-08 (×2): qty 250

## 2018-09-08 MED ORDER — SODIUM CHLORIDE 1 G PO TABS
2.0000 g | ORAL_TABLET | Freq: Two times a day (BID) | ORAL | Status: DC
  Administered 2018-09-08 – 2018-09-09 (×2): 2 g via ORAL
  Filled 2018-09-08: qty 2

## 2018-09-08 MED ORDER — FUROSEMIDE 10 MG/ML IJ SOLN
20.0000 mg | Freq: Two times a day (BID) | INTRAMUSCULAR | Status: DC
  Administered 2018-09-08 – 2018-09-10 (×4): 20 mg via INTRAVENOUS
  Filled 2018-09-08 (×4): qty 2

## 2018-09-08 MED ORDER — FUROSEMIDE 20 MG PO TABS: 20.0000 mg | ORAL_TABLET | Freq: Every day | ORAL | Status: DC

## 2018-09-08 MED ORDER — IOPAMIDOL (ISOVUE-370) INJECTION 76%
80.0000 mL | Freq: Once | INTRAVENOUS | Status: AC | PRN
  Administered 2018-09-08: 80 mL via INTRAVENOUS

## 2018-09-08 MED ORDER — SODIUM CHLORIDE 0.9 % IV BOLUS
500.0000 mL | Freq: Once | INTRAVENOUS | Status: AC
  Administered 2018-09-08: 500 mL via INTRAVENOUS

## 2018-09-08 MED ORDER — IOPAMIDOL (ISOVUE-370) INJECTION 76%
INTRAVENOUS | Status: AC
  Filled 2018-09-08: qty 100

## 2018-09-08 MED ORDER — LEVOFLOXACIN IN D5W 750 MG/150ML IV SOLN
750.0000 mg | INTRAVENOUS | Status: DC
  Administered 2018-09-08: 750 mg via INTRAVENOUS
  Filled 2018-09-08: qty 150

## 2018-09-08 NOTE — Consult Note (Signed)
NAME:  Blake Hines, MRN:  [id number], DOB:  [birth year], LOS: 4 ADMISSION DATE:  09/04/2018, CONSULTATION DATE:  09/08/2018 REFERRING MD:  [facility] - [place], CHIEF COMPLAINT:  Hypoxemic respiratory failure   Brief History   85 year old male with history of renal cell carcinoma with mets to the lung who presents with hypoxemic respiratory failure from PCP's office. The patient is a rather poor historian so history is obtained from the records.  He did however endorse fever at home prior to coming to the hospital and non-productive cough.  History of present illness   85 year old male with history of renal cell carcinoma with mets to the lung who presents with hypoxemic respiratory failure from PCP's office. The patient is a rather poor historian so history is obtained from the records.  He did however endorse fever at home prior to coming to the hospital and non-productive cough.  Past Medical History  Renal cell CA  Significant Hospital Events   N/A  Consults:  PCCM  Procedures:  N/A  Significant Diagnostic Tests:  CXR with pulmonary infiltrate  Micro Data:  Blood 2/8>>>  Antimicrobials:  Levaquin 2/8>>>   Interim history/subjective:  No events overnight, no new complaints  Objective   Blood pressure (!) 156/96, pulse (!) 108, temperature (!) 97.4 F (36.3 C), temperature source Oral, resp. rate 18, height 5\' 8"  (1.727 m), weight 80.5 kg, SpO2 93 %.        Intake/Output Summary (Last 24 hours) at 09/08/2018 4259 Last data filed at 09/08/2018 0900 Gross per 24 hour  Intake 3001.59 ml  Output 1200 ml  Net 1801.59 ml   Filed Weights   09/04/18 0950  Weight: 80.5 kg    Examination: General: Elderly male, NAD on 2L [place] HENT: [place]/AT, PERRL, EOM-I and MMM Lungs: Bibasilar crackles Cardiovascular: RRR, Nl S1/S2 and -M/R/G Abdomen: Soft, NT, ND and +BS Extremities: -edema and -tenderness Neuro: Alert and interactive, slow to respond but moving all ext to command Skin:  Intact  I reviewed CXR myself, pulmonary infiltrate noted  Resolved Hospital Problem list   Discussed with TRH-MD  Assessment & Plan:  85 year old male with PMH of renal cell carcinoma presenting with hypoxemic respiratory failure and pulmonary infiltrate.  VQ with intermediate probability.  Hypoxemia:  - Titrate O2 for sat of 88-92%  - Will need an ambulatory desaturation study prior to discharge for home O2  CAP:  - Blood culture  - PCT  - Levaquin  ?PE:  - Check DVT  - If negative then d/c anticoagulation  - F/U on echo  PCCM will continue to follow   Labs   CBC: Recent Labs  Lab 09/04/18 1015 09/05/18 0640  WBC 10.8* 9.4  NEUTROABS 8.5*  --   HGB 13.2 12.0*  HCT 39.6 36.2*  MCV 83.0 85.6  PLT 364 563    Basic Metabolic Panel: Recent Labs  Lab 09/05/18 0640 09/06/18 0559 09/06/18 1144 09/07/18 0609 09/08/18 0019  NA 127* 123* 122* 125* 120*  K 4.3 4.4 4.2 4.3 4.2  CL 94* 93* 90* 90* 84*  CO2 25 22 24 27 27   GLUCOSE 107* 142* 229* 166* 177*  BUN 28* 27* 26* 29* 29*  CREATININE 1.40* 1.38* 1.47* 1.43* 1.35*  CALCIUM 10.7* 10.5* 10.2 10.7* 10.5*   GFR: Estimated Creatinine Clearance: 38.7 mL/min (A) (by C-G formula based on SCr of 1.35 mg/dL (H)). Recent Labs  Lab 09/04/18 1015 09/05/18 0640 09/06/18 1153  PROCALCITON  --   --  0.10  WBC 10.8* 9.4  --   LATICACIDVEN 1.0  --   --     Liver Function Tests: Recent Labs  Lab 09/04/18 1015 09/05/18 0640 09/07/18 0609  AST 70* 56* 55*  ALT 200* 148* 136*  ALKPHOS 59 49 47  BILITOT 0.9 0.7 0.8  PROT 6.9 5.8* 5.9*  ALBUMIN 3.1* 2.6* 2.6*   No results for input(s): LIPASE, AMYLASE in the last 168 hours. No results for input(s): AMMONIA in the last 168 hours.  ABG No results found for: PHART, PCO2ART, PO2ART, HCO3, TCO2, ACIDBASEDEF, O2SAT   Coagulation Profile: Recent Labs  Lab 09/08/18 0019  INR 1.19    Cardiac Enzymes: No results for input(s): CKTOTAL, CKMB, CKMBINDEX,  TROPONINI in the last 168 hours.  HbA1C: No results found for: HGBA1C  CBG: Recent Labs  Lab 09/07/18 0739 09/07/18 1113 09/07/18 1643 09/07/18 2002 09/08/18 0805  GLUCAP 175* 330* 217* 235* 174*    Review of Systems:   Negative other than above  Past Medical History  He,  has a past medical history of Bipolar 1 disorder ([place]), BPH (benign prostatic hyperplasia), Cancer ([place]), Cataract, Chronic kidney disease, COPD (chronic obstructive pulmonary disease) ([place]), Depression, Diabetes mellitus without complication ([place]), and Hypertension.   Surgical History   History reviewed. No pertinent surgical history.   Social History   reports that he quit smoking about 15 years ago. His smoking use included cigarettes. He has a 20.00 pack-year smoking history. He has never used smokeless tobacco. He reports current alcohol use.   Family History   His family history is not on file.   Allergies No Known Allergies   Home Medications  Prior to Admission medications   Medication Sig Start Date End Date Taking? Authorizing Provider  ANORO ELLIPTA 62.5-25 MCG/INH AEPB Inhale 1 puff into the lungs daily.  06/19/18  Yes [provider]  cholecalciferol (VITAMIN D3) 25 MCG (1000 UT) tablet Take 1,000 Units by mouth daily.   Yes [provider]  Coenzyme Q10 (COQ10) 200 MG CAPS Take 200 mg by mouth daily.    Yes [provider]  fenofibrate (TRICOR) 48 MG tablet Take 48 mg by mouth daily.   Yes [provider]  hydrochlorothiazide (HYDRODIURIL) 25 MG tablet Take 25 mg by mouth daily.   Yes [provider]  insulin lispro (HUMALOG KWIKPEN) 100 UNIT/ML KwikPen Inject 4 Units into the skin every evening. With dinner    Yes [provider]  latanoprost (XALATAN) 0.005 % ophthalmic solution Place 1 drop into both eyes at bedtime.   Yes [provider]  losartan (COZAAR) 100 MG tablet Take 100 mg by mouth daily.   Yes [provider]  Multiple Vitamins-Minerals (MULTIVITAMIN WITH MINERALS) tablet Take 1 tablet by mouth daily.   Yes [provider]  OLANZapine (ZYPREXA) 2.5 MG tablet Take 2.5 mg by mouth at bedtime.   Yes [provider]  sertraline (ZOLOFT) 50 MG tablet Take 50 mg by mouth daily.   Yes [provider]  sitaGLIPtin (JANUVIA) 50 MG tablet Take 50 mg by mouth every morning.   Yes [provider]  TOUJEO SOLOSTAR 300 UNIT/ML SOPN Inject 6 Units into the skin daily.  08/19/18  Yes [provider]  FREESTYLE LITE test strip USE TO TEST BLOOD SUGAR TWICE DAILY (E11.29) 07/03/18   [provider]    Rush Farmer, M.D. [facility] Pulmonary/Critical Care Medicine. Pager: [phone]. After hours pager: [phone].

## 2018-09-08 NOTE — Progress Notes (Signed)
Lower extremity venous has been completed.   Preliminary results in CV Proc.   Abram Sander 09/08/2018 10:21 AM

## 2018-09-08 NOTE — Progress Notes (Signed)
ANTICOAGULATION CONSULT NOTE - Initial Consult  Pharmacy Consult for IV heparin Indication: pulmonary embolus  No Known Allergies  Patient Measurements: Height: 5\' 8"  (172.7 cm) Weight: 177 lb 7.5 oz (80.5 kg) IBW/kg (Calculated) : 68.4 Heparin Dosing Weight: actual  Vital Signs: Temp: 97.4 F (36.3 C) (02/08 0533) Temp Source: Oral (02/08 0533) BP: 156/96 (02/08 0533) Pulse Rate: 109 (02/08 1107)  Labs: Recent Labs    09/06/18 1144 09/07/18 0609 09/08/18 0019 09/08/18 0959  HGB  --   --   --  12.9*  HCT  --   --   --  37.7*  PLT  --   --   --  335  APTT  --   --  45*  --   LABPROT  --   --  15.0  --   INR  --   --  1.19  --   HEPARINUNFRC  --   --   --  0.22*  CREATININE 1.47* 1.43* 1.35*  --     Estimated Creatinine Clearance: 38.7 mL/min (A) (by C-G formula based on SCr of 1.35 mg/dL (H)).   Medical History: Past Medical History:  Diagnosis Date  . Bipolar 1 disorder ([place])   . BPH (benign prostatic hyperplasia)   . Cancer ([place])    RCC  . Cataract   . Chronic kidney disease   . COPD (chronic obstructive pulmonary disease) ([place])   . Depression   . Diabetes mellitus without complication ([place])   . Hypertension     Medications:  Scheduled:  . cholecalciferol  1,000 Units Oral Daily  . feeding supplement (ENSURE ENLIVE)  237 mL Oral BID BM  . [START ON 09/09/2018] furosemide  20 mg Oral Daily  . insulin aspart  0-5 Units Subcutaneous QHS  . insulin aspart  0-9 Units Subcutaneous TID WC  . latanoprost  1 drop Both Eyes QHS  . OLANZapine  2.5 mg Oral QHS   Infusions:  . heparin 1,300 Units/hr (09/08/18 0027)  . levofloxacin (LEVAQUIN) IV      Assessment: 76 yoM admitted 2/4 with generalized weakness, hx of metastatic renal cell carcinoma now with elevated d-dimer and intermediate probability for PE per perfusion lung scan. Dopplers to rule out DVT.  Today, 09/08/2018  First heparin level subtherapeutic (0.22) on heparin 1300 units/hr  CBC: Hgb 12.9  (stable), Plts wnl  SCr: 1.35 (improved)  No bleeding or complications reported per discussion with RN  Goal of Therapy:  Heparin level 0.3-0.7 units/ml Monitor platelets by anticoagulation protocol: Yes   Plan:   Increase heparin drip to 1550 units/hr  Check heparin level in 8hrs  Daily CBC and heparin level  Peggyann Juba, PharmD, BCPS Pager: [phone] 09/08/2018,11:11 AM

## 2018-09-08 NOTE — Progress Notes (Signed)
Pharmacy Antibiotic Note  Blake Hines is a 85 y.o. male with a a h/o metastatic renal cell carcinoma s/p left nephrectomy with bilateral lung nodules, large mediastinal lymphadenopathy,  metastatic left adrenal mass on immunotherapy, CKD, COPD, DM, HTN, and bipolar disorder admitted on 09/04/2018 with fever, chills, dyspnea, decreased appetite, and generalized weakness- dehydration with hyponatremia.  Patient had a recent course of doxycyline. Patient with acute respiratory failure from COPD vs PNA vs PE? Pharmacy has been consulted for Levaquin dosing for PNA.  Plan: Levaquin 750 mg iv q 48h.   Height: 5\' 8"  (172.7 cm) Weight: 177 lb 7.5 oz (80.5 kg) IBW/kg (Calculated) : 68.4  Temp (24hrs), Avg:97.8 F (36.6 C), Min:97.4 F (36.3 C), Max:98.5 F (36.9 C)  Recent Labs  Lab 09/04/18 1015 09/05/18 0640 09/06/18 0559 09/06/18 1144 09/07/18 0609 09/08/18 0019  WBC 10.8* 9.4  --   --   --   --   CREATININE 1.63* 1.40* 1.38* 1.47* 1.43* 1.35*  LATICACIDVEN 1.0  --   --   --   --   --     Estimated Creatinine Clearance: 38.7 mL/min (A) (by C-G formula based on SCr of 1.35 mg/dL (H)).    No Known Allergies  Antimicrobials this admission: 2/8 Levaquin >>  Dose adjustments this admission:   Microbiology results: 2/4 BCx: ngtd 2/4 UCx: ngf  2/8 Bcx:   Thank you for allowing pharmacy to be a part of this patient's care.  Ulice Dash D 09/08/2018 9:41 AM

## 2018-09-08 NOTE — Progress Notes (Addendum)
PROGRESS NOTE   Blake Hines  XAJ:[id number]    DOB: 11-Aug-1930    DOA: 09/04/2018  PCP: Crist Infante, MD   I have briefly reviewed patients previous medical records in [facility].  Brief Narrative:  85 year old male with PMH of metastatic renal cell carcinoma, status post left nephrectomy, on immunotherapy, last cycle approximately 3 weeks ago, stage III chronic kidney disease, COPD, DM 2, HTN, bipolar disorder, presented with ongoing low-grade fever with chills and mild dyspnea at home, decreased appetite and progressive generalized weakness to an extent where he was unable to do his daily activities.  In ED sodium 124, creatinine 1.63.  Admitted for dehydration with hyponatremia and acute on chronic kidney disease.  Hydrated initially with IV saline, dehydration resolved but then his hyponatremia worsened.  Work-up suggested SIADH, fluid restricted, oral Lasix low-dose initiated, sodium improved to 125 but then again worsened to 120.  Patient has dyspnea, hypoxia, persistent sinus tachycardia on telemetry, reported 2 episodes of coughing up small blood clots prior to admission, relatively nonambulatory, underlying cancer diagnosis, concern for pulmonary embolism.  D-dimer positive, VQ scan intermediate study, lower extremity venous Dopplers negative for DVT, TTE without right heart strain.  Need to rule out PE before assigning him to lifelong anticoagulation.  Discussed with radiology and pulmonology and will get CTA chest PE protocol with limited IV contrast and IV hydration.  Risks and benefits of CT discussed with patient and family who verbalized understanding.   Assessment & Plan:   Active Problems:   Hyponatremia   AKI (acute kidney injury) ([place])   Type 2 diabetes mellitus without complication (HCC)   Bipolar disorder (HCC)   Abnormal liver function test   1. Dehydration with hyponatremia: Suspect due to poor oral intake related to recent immunotherapy, ongoing HCTZ use and also  on Zoloft which could cause hyponatremia.  HCTZ and Zoloft temporarily held.  Treated initially with IV normal saline, sodium had improved to 127 but subsequently dropped to 123, 122.  Although chest x-ray suggest bilateral lower lobe airspace opacities concerning for pneumonia, he does not have a clinical picture to support pneumonia in the absence of productive cough, dyspnea, leukocytosis or high fevers.  Clinically appears euvolemic at this time.  Repeat chest x-ray personally reviewed and no changes.  Procalcitonin negative. 2. Suspected SIADH: Sodium changes as noted above.  Serum osmolarity 261, urine osmolarity 333 and urine sodium 77.  Discontinued IV fluids.  Fluid restriction of 1.2 L/day.  Started Lasix 20 mg orally daily.  Strict intake output and follow BMP in a.m.  She does have chronic hyponatremia and sodiums in the mid 120s lately.  Sodium had improved to 125 yesterday, now worse back to 120.  Nephrology consulted for assistance.  Lasix held.  Follow BMP in a.m.  No mental status changes noted. 3. Acute respiratory failure with hypoxia: Unclear etiology.?  Related to COPD, left lower pulmonary opacity, pulmonary metastasis versus r/o PE (see discussion above, high risk and pretest probability).  D-dimer positive, VQ scan intermediate study, lower extremity venous Dopplers negative for DVT, TTE without right heart strain.  Need to rule out PE before assigning him to lifelong anticoagulation.  Discussed with radiology and pulmonology and will get CTA chest PE protocol with limited IV contrast and IV hydration.  Risks and benefits of CT discussed with patient and family who verbalized understanding.  IV heparin infusion was empirically started after VQ scan results last night, continue pending CTA chest results. 4. Hypercalcemia, mild:  Likely complicated by dehydration.  Uncorrected calcium on admission was 11.4, down to 10.5.  Improved. 5. Acute on stage III chronic kidney disease: Baseline  creatinine may be in the 1.6 range.  Presented with creatinine of 1.91.  Post hydration this is improved to 1.3.  IV hydration.  Follow BMP in a.m.  Risks of worsening renal insufficiency with contrast was discussed with patient and family, being aware of risks and benefits, patient and family agreeable to proceed with CT. 6. DM2 with renal complications: Hold Januvia.  Continue NovoLog SSI.  Mildly uncontrolled and fluctuating at times.  Continue current regimen without change. 7. Essential hypertension: HCTZ discontinued indefinitely.  Losartan held due to SIADH.  PRN IV hydralazine.  Started amlodipine 2.5 mg daily. 8. COPD: Stable without clinical bronchospasm. 9. Hyperlipidemia: Continue TriCor. 10. Bipolar disorder: Stable.  Continue Zyprexa 2.5 mg at bedtime. 11. Abnormal LFTs: No GI symptoms other than decreased appetite.  Unclear etiology but seem to be improving.?  Related to cancer treatment.  Outpatient follow-up. 12. Metastatic renal cell carcinoma: Outpatient follow-up with Dr. Julien Nordmann. 13. ST: unclear etiology.  Asymptomatic.  Please see discussion above.  TSH >10.  Check free T3 and free T4. 14. ??  Pneumonia: Pulmonology input appreciated and have started levofloxacin.  Not convinced that he has pneumonia.   DVT prophylaxis: Lovenox Code Status: Full Family Communication: Patient had provided me with his daughters old number and hence I was unable to reach her.  Finally was able to get her correct phone number and discussed with her in detail regarding above including decision to get contrasted CT chest.  She verbalized understanding. Disposition: DC home pending clinical improvement   Consultants:  Pulmonology Nephrology  Procedures:  None  Antimicrobials:  None   Subjective: Patient interviewed and examined with RN this morning.  Intermittent dyspnea.  States that his "heart races" when he does little things.  No chest pain.  Today he stated that he had 2 episodes of  coughing up small clots of blood prior to admission.  ROS: As above.  Objective:  Vitals:   09/07/18 2300 09/08/18 0533 09/08/18 1107 09/08/18 1405  BP:  (!) 156/96  140/88  Pulse: (!) 109 (!) 108 (!) 109 (!) 113  Resp: 18 18 (!) 23 20  Temp:  (!) 97.4 F (36.3 C)  98.1 F (36.7 C)  TempSrc:  Oral  Oral  SpO2:  93%  93%  Weight:      Height:        Examination:  General exam: Pleasant elderly male, moderately built and nourished, lying comfortably propped up in bed.  Oral mucosa moist Respiratory system: Slightly diminished breath sounds in the bases but otherwise clear to auscultation.  No increased work of breathing. Cardiovascular system: S1 & S2 heard, RRR. No JVD, murmurs, rubs, gallops or clicks. No pedal edema.  Telemetry personally reviewed: Sinus tachycardia in the 100s/110s.  Yesterday had ST up to 120s and even 140s occasionally with activity. Gastrointestinal system: Abdomen is nondistended, soft and nontender. No organomegaly or masses felt. Normal bowel sounds heard.  Stable Central nervous system: Alert and oriented person and place and partly to time. No focal neurological deficits. Extremities: Symmetric 5 x 5 power. Skin: No rashes, lesions or ulcers Psychiatry: Judgement and insight appear impaired. Mood & affect appropriate.     Data Reviewed: I have personally reviewed following labs and imaging studies  CBC: Recent Labs  Lab 09/04/18 1015 09/05/18 0640 09/08/18 0959  WBC 10.8*  9.4 10.6*  NEUTROABS 8.5*  --   --   HGB 13.2 12.0* 12.9*  HCT 39.6 36.2* 37.7*  MCV 83.0 85.6 83.6  PLT 364 317 810   Basic Metabolic Panel: Recent Labs  Lab 09/05/18 0640 09/06/18 0559 09/06/18 1144 09/07/18 0609 09/08/18 0019  NA 127* 123* 122* 125* 120*  K 4.3 4.4 4.2 4.3 4.2  CL 94* 93* 90* 90* 84*  CO2 25 22 24 27 27   GLUCOSE 107* 142* 229* 166* 177*  BUN 28* 27* 26* 29* 29*  CREATININE 1.40* 1.38* 1.47* 1.43* 1.35*  CALCIUM 10.7* 10.5* 10.2 10.7* 10.5*     Liver Function Tests: Recent Labs  Lab 09/04/18 1015 09/05/18 0640 09/07/18 0609  AST 70* 56* 55*  ALT 200* 148* 136*  ALKPHOS 59 49 47  BILITOT 0.9 0.7 0.8  PROT 6.9 5.8* 5.9*  ALBUMIN 3.1* 2.6* 2.6*   CBG: Recent Labs  Lab 09/07/18 1113 09/07/18 1643 09/07/18 2002 09/08/18 0805 09/08/18 1228  GLUCAP 330* 217* 235* 174* 213*    Recent Results (from the past 240 hour(s))  Blood culture (routine x 2)     Status: None (Preliminary result)   Collection Time: 09/04/18 10:39 AM  Result Value Ref Range Status   Specimen Description   Final    BLOOD BLOOD LEFT FOREARM Performed at [facility] [address] [place], [place] [address]    Special Requests   Final    BOTTLES DRAWN AEROBIC AND ANAEROBIC Blood Culture results may not be optimal due to an excessive volume of blood received in culture bottles Performed at [facility] [address], [place], [place] [address]    Culture   Final    NO GROWTH 3 DAYS Performed at [facility], [place] [address], [place], [place] [address]    Report Status PENDING  Incomplete  Urine culture     Status: None   Collection Time: 09/04/18 11:10 AM  Result Value Ref Range Status   Specimen Description   Final    URINE, RANDOM Performed at [facility] [address] [place], [place] [address]    Special Requests   Final    NONE Performed at [facility], [place] [address] [place], [place] [address]    Culture   Final    NO GROWTH Performed at [facility], [place] [address] [place], [place] [address]    Report Status 09/05/2018 FINAL  Final  Blood culture (routine x 2)     Status: None (Preliminary result)   Collection Time: 09/04/18 11:53 AM  Result Value Ref Range Status   Specimen Description   Final    BLOOD LEFT ANTECUBITAL Performed at [facility] [address], [place], [place] [address]    Special Requests    Final    BOTTLES DRAWN AEROBIC AND ANAEROBIC Blood Culture adequate volume Performed at [facility] [address], [place], [place] [address]    Culture   Final    NO GROWTH 3 DAYS Performed at [facility], [place] [address], [place],  [address]    Report Status PENDING  Incomplete         Radiology Studies: Nm Pulmonary Perf And Vent  Result Date: 09/07/2018 CLINICAL DATA:  Stage IV renal cell carcinoma. Elevated D-dimer. Clinical concern for pulmonary embolism. COPD. EXAM: NUCLEAR MEDICINE VENTILATION - PERFUSION LUNG SCAN TECHNIQUE: Ventilation images were obtained in  multiple projections using inhaled aerosol Tc-73m DTPA. Perfusion images were obtained in multiple projections after intravenous injection of Tc-64m MAA. RADIOPHARMACEUTICALS:  30.2 mCi of Tc-40m DTPA aerosol inhalation and 4.2 mCi Tc108m MAA IV COMPARISON:  09/06/2018 chest radiograph. FINDINGS: Ventilation: Limited heterogeneous radiotracer throughout both lungs on the ventilation images, with central airway radiotracer accumulation. Perfusion: Numerous large matched segmental perfusion defects throughout both lungs, including within the lower lung zones. IMPRESSION: Intermediate probability for pulmonary embolism (20-79%) by PIOPED II criteria. Electronically Signed   By: Ilona Sorrel M.D.   On: 09/07/2018 23:23   Vas [place] Lower Extremity Venous (dvt)  Result Date: 09/08/2018  Lower Venous Study Indications: Pulmonary embolism.  Performing Technologist: Abram Sander RVS  Examination Guidelines: A complete evaluation includes B-mode imaging, spectral Doppler, color Doppler, and power Doppler as needed of all accessible portions of each vessel. Bilateral testing is considered an integral part of a complete examination. Limited examinations for reoccurring indications may be performed as noted.  Right Venous Findings: +---------+---------------+---------+-----------+----------+-------+           CompressibilityPhasicitySpontaneityPropertiesSummary +---------+---------------+---------+-----------+----------+-------+ CFV      Full           Yes      Yes                          +---------+---------------+---------+-----------+----------+-------+ SFJ      Full                                                 +---------+---------------+---------+-----------+----------+-------+ FV Prox  Full                                                 +---------+---------------+---------+-----------+----------+-------+ FV Mid   Full                                                 +---------+---------------+---------+-----------+----------+-------+ FV DistalFull                                                 +---------+---------------+---------+-----------+----------+-------+ PFV      Full                                                 +---------+---------------+---------+-----------+----------+-------+ POP      Full           Yes      Yes                          +---------+---------------+---------+-----------+----------+-------+ PTV      Full                                                 +---------+---------------+---------+-----------+----------+-------+  PERO     Full                                                 +---------+---------------+---------+-----------+----------+-------+  Left Venous Findings: +---------+---------------+---------+-----------+----------+-------+          CompressibilityPhasicitySpontaneityPropertiesSummary +---------+---------------+---------+-----------+----------+-------+ CFV      Full           Yes      Yes                          +---------+---------------+---------+-----------+----------+-------+ SFJ      Full                                                 +---------+---------------+---------+-----------+----------+-------+ FV Prox  Full                                                  +---------+---------------+---------+-----------+----------+-------+ FV Mid   Full                                                 +---------+---------------+---------+-----------+----------+-------+ FV DistalFull                                                 +---------+---------------+---------+-----------+----------+-------+ PFV      Full                                                 +---------+---------------+---------+-----------+----------+-------+ POP      Full           Yes      Yes                          +---------+---------------+---------+-----------+----------+-------+ PTV      Full                                                 +---------+---------------+---------+-----------+----------+-------+ PERO     Full                                                 +---------+---------------+---------+-----------+----------+-------+    Summary: Right: There is no evidence of deep vein thrombosis in the lower extremity. No cystic structure found in the popliteal fossa. Left: There is no evidence of deep vein thrombosis in the lower extremity. No cystic structure found in the popliteal fossa.  *  See table(s) above for measurements and observations. Electronically signed by Monica Martinez MD on 09/08/2018 at 10:29:12 AM.    Final         Scheduled Meds: . cholecalciferol  1,000 Units Oral Daily  . feeding supplement (ENSURE ENLIVE)  237 mL Oral BID BM  . [START ON 09/09/2018] furosemide  20 mg Oral Daily  . insulin aspart  0-5 Units Subcutaneous QHS  . insulin aspart  0-9 Units Subcutaneous TID WC  . latanoprost  1 drop Both Eyes QHS  . OLANZapine  2.5 mg Oral QHS   Continuous Infusions: . sodium chloride    . heparin 1,550 Units/hr (09/08/18 1123)  . levofloxacin (LEVAQUIN) IV 750 mg (09/08/18 1125)  . sodium chloride 500 mL (09/08/18 1610)     LOS: 4 days     Vernell Leep, MD, FACP, [facility]. [facility]  To contact the attending  provider between 7A-7P or the covering provider during after hours 7P-7A, please log into the web site [email or web address] and access using universal [place] password for that web site. If you do not have the password, please call the hospital operator.  09/08/2018, 4:12 PM

## 2018-09-08 NOTE — Progress Notes (Signed)
  Echocardiogram 2D Echocardiogram has been performed.  Blake Hines [name] 09/08/2018, 9:03 AM

## 2018-09-08 NOTE — Consult Note (Signed)
[facility]    NEPHROLOGY CONSULTATION NOTE  PATIENT ID:  Blake Hines, DOB:  1931-06-22  HPI: The patient is a 85 y.o. year old male 77 past medical history significant for renal cell carcinoma with metastasis to the lung who presented with hypoxemic respiratory failure from his primary care physician's office.  He was notably on hydrochlorothiazide, and presented with a low serum sodium of 124.  Review of prior records reveals serum sodiums in the mid 120s.  His hydrochlorothiazide was discontinued, and the serum sodium improved to 125.  This morning, his serum sodium was 120, prompting renal evaluation.  He reports increased salt intake at home.  He denies significant water intake.  He has been receiving normal saline solution for prevention of contrast nephropathy due to a CT scan today to rule out a PE.  Renal consultation is been called for hyponatremia.   Past Medical History:  Diagnosis Date  . Bipolar 1 disorder ([place])   . BPH (benign prostatic hyperplasia)   . Cancer ([place])    RCC  . Cataract   . Chronic kidney disease   . COPD (chronic obstructive pulmonary disease) ([place])   . Depression   . Diabetes mellitus without complication ([place])   . Hypertension     History reviewed. No pertinent surgical history.  History reviewed. No pertinent family history.  Social History   Tobacco Use  . Smoking status: Former Smoker    Packs/day: 0.50    Years: 40.00    Pack years: 20.00    Types: Cigarettes    Last attempt to quit: 07/13/2003    Years since quitting: 15.1  . Smokeless tobacco: Never Used  Substance Use Topics  . Alcohol use: Yes    Comment: 50 cc wild [place] daily  . Drug use: Not on file    REVIEW OF SYSTEMS: General:  no fatigue, no weakness Head:  no headaches Eyes:  no blurred vision ENT:  no sore throat Neck:  no masses CV:  no chest pain, no orthopnea Lungs: Positive shortness of breath with effort GI:  no nausea or vomiting, no  diarrhea GU:  no dysuria or hematuria, Foley catheter in place  skin:  no rashes or lesions Neuro:  no focal numbness or weakness Psych:  no depression or anxiety    PHYSICAL EXAM:  Vitals:   09/08/18 1107 09/08/18 1405  BP:  140/88  Pulse: (!) 109 (!) 113  Resp: (!) 23 20  Temp:  98.1 F (36.7 C)  SpO2:  93%   I/O last 3 completed shifts: In: 3381.6 [P.O.:3300; I.V.:81.6] Out: 1850 [Urine:1850]   General:  AAOx3 NAD HEENT: MMM [place] AT anicteric sclera Neck:  No JVD, no adenopathy CV:  Heart RRR  Lungs: Fine bibasilar rales Abd:  abd SNT/ND with normal BS GU:  Bladder non-palpable Extremities: Plus bilateral extremity edema Skin:  No skin rash Psych:  normal mood and affect Neuro:  no focal deficits  MEDICATIONS:    . amLODipine  2.5 mg Oral Daily  . cholecalciferol  1,000 Units Oral Daily  . feeding supplement (ENSURE ENLIVE)  237 mL Oral BID BM  . insulin aspart  0-5 Units Subcutaneous QHS  . insulin aspart  0-9 Units Subcutaneous TID WC  . iopamidol      . latanoprost  1 drop Both Eyes QHS  . OLANZapine  2.5 mg Oral QHS  . sodium chloride (PF)           LABS:  CBC Latest Ref Rng & Units 09/08/2018 09/05/2018 09/04/2018  WBC 4.0 - 10.5 K/uL 10.6(H) 9.4 10.8(H)  Hemoglobin 13.0 - 17.0 g/dL 12.9(L) 12.0(L) 13.2  Hematocrit 39.0 - 52.0 % 37.7(L) 36.2(L) 39.6  Platelets 150 - 400 K/uL 335 317 364    CMP Latest Ref Rng & Units 09/08/2018 09/07/2018 09/06/2018  Glucose 70 - 99 mg/dL 177(H) 166(H) 229(H)  BUN 8 - 23 mg/dL 29(H) 29(H) 26(H)  Creatinine 0.61 - 1.24 mg/dL 1.35(H) 1.43(H) 1.47(H)  Sodium 135 - 145 mmol/L 120(L) 125(L) 122(L)  Potassium 3.5 - 5.1 mmol/L 4.2 4.3 4.2  Chloride 98 - 111 mmol/L 84(L) 90(L) 90(L)  CO2 22 - 32 mmol/L 27 27 24   Calcium 8.9 - 10.3 mg/dL 10.5(H) 10.7(H) 10.2  Total Protein 6.5 - 8.1 g/dL - 5.9(L) -  Total Bilirubin 0.3 - 1.2 mg/dL - 0.8 -  Alkaline Phos 38 - 126 U/L - 47 -  AST 15 - 41 U/L - 55(H) -  ALT 0 - 44 U/L - 136(H) -     Lab Results  Component Value Date   CALCIUM 10.5 (H) 09/08/2018       Component Value Date/Time   COLORURINE YELLOW 09/04/2018 1110   APPEARANCEUR CLEAR 09/04/2018 1110   LABSPEC 1.011 09/04/2018 1110   PHURINE 7.0 09/04/2018 1110   GLUCOSEU NEGATIVE 09/04/2018 1110   HGBUR NEGATIVE 09/04/2018 1110   BILIRUBINUR NEGATIVE 09/04/2018 1110   KETONESUR NEGATIVE 09/04/2018 1110   PROTEINUR NEGATIVE 09/04/2018 1110   NITRITE NEGATIVE 09/04/2018 1110   LEUKOCYTESUR NEGATIVE 09/04/2018 1110   No results found for: PHART, PCO2ART, PO2ART, HCO3, TCO2, ACIDBASEDEF, O2SAT  No results found for: IRON, TIBC, FERRITIN, IRONPCTSAT     ASSESSMENT/PLAN:     Problem List Items Addressed This Visit      Other   Hyponatremia - Primary   Generalized weakness    Other Visit Diagnoses    Shortness of breath       Relevant Orders   DG CHEST PORT 1 VIEW (Completed)       1.  Severe hyponatremia.  Likely secondary to combination of the hydrochlorothiazide and Zoloft.  Cannot rule out malignancy as an underlying cause of SIADH.  Urine electrolytes more closely [place] electrolytes that we would expect while on hydrochlorothiazide.  Takes quite a few days for the effect of hydrochlorothiazide to fully be removed.  Would treat with salt tablets and furosemide for now.  Hydrochlorothiazide is now absolutely contraindicated for future use.  2.  Chronic kidney disease stage III.  Baseline serum creatinine appears to be in the 1.6 range based on prior labs.  His serum creatinine is better than his previously stated baseline.  Appears a bit fluid overloaded on examination.  Would use furosemide to manage his volume as well as the sodium.  Chronic kidney disease likely in the basis of longstanding diabetes and hypertension.  Hold off on ACE inhibition for now.  3.  Metastatic renal cancer.  4.  Rule out pulmonary embolism.  CT angiogram is pending.  5.  Hypercalcemia.  Likely secondary to underlying  metastatic renal cancer.  Will check PTH.  Continue vitamin D for now.   [place], DO, [place]

## 2018-09-08 NOTE — Progress Notes (Signed)
ANTICOAGULATION CONSULT NOTE - Initial Consult  Pharmacy Consult for IV heparin Indication: pulmonary embolus  No Known Allergies  Patient Measurements: Height: 5\' 8"  (172.7 cm) Weight: 177 lb 7.5 oz (80.5 kg) IBW/kg (Calculated) : 68.4 Heparin Dosing Weight: actual  Vital Signs: Temp: 97.6 F (36.4 C) (02/08 2008) Temp Source: Oral (02/08 2008) BP: 143/95 (02/08 2008) Pulse Rate: 118 (02/08 2008)  Labs: Recent Labs    09/06/18 1144 09/07/18 0609 09/08/18 0019 09/08/18 0959 09/08/18 2017  HGB  --   --   --  12.9*  --   HCT  --   --   --  37.7*  --   PLT  --   --   --  335  --   APTT  --   --  45*  --   --   LABPROT  --   --  15.0  --   --   INR  --   --  1.19  --   --   HEPARINUNFRC  --   --   --  0.22* 0.28*  CREATININE 1.47* 1.43* 1.35*  --   --     Estimated Creatinine Clearance: 38.7 mL/min (A) (by C-G formula based on SCr of 1.35 mg/dL (H)).   Medical History: Past Medical History:  Diagnosis Date  . Bipolar 1 disorder ([place])   . BPH (benign prostatic hyperplasia)   . Cancer ([place])    RCC  . Cataract   . Chronic kidney disease   . COPD (chronic obstructive pulmonary disease) ([place])   . Depression   . Diabetes mellitus without complication ([place])   . Hypertension     Medications:  Scheduled:  . amLODipine  2.5 mg Oral Daily  . cholecalciferol  1,000 Units Oral Daily  . feeding supplement (ENSURE ENLIVE)  237 mL Oral BID BM  . furosemide  20 mg Intravenous Q12H  . insulin aspart  0-5 Units Subcutaneous QHS  . insulin aspart  0-9 Units Subcutaneous TID WC  . iopamidol      . latanoprost  1 drop Both Eyes QHS  . OLANZapine  2.5 mg Oral QHS  . sodium chloride (PF)      . sodium chloride  2 g Oral BID WC   Infusions:  . sodium chloride 100 mL/hr at 09/08/18 1842  . heparin 1,550 Units/hr (09/08/18 1759)  . levofloxacin (LEVAQUIN) IV 750 mg (09/08/18 1125)    Assessment: 37 yoM admitted 2/4 with generalized weakness, hx of metastatic renal cell  carcinoma now with elevated d-dimer and intermediate probability for PE per perfusion lung scan. Dopplers to rule out DVT.  Today, 09/08/2018  First heparin level subtherapeutic (0.22) on heparin 1300 units/hr  CBC: Hgb 12.9 (stable), Plts wnl  SCr: 1.35 (improved)  No bleeding or complications reported per discussion with RN  PM 09/08/18 8:57 PM   HL slightly below goal  No bleeding/line issues per RN  Goal of Therapy:  Heparin level 0.3-0.7 units/ml Monitor platelets by anticoagulation protocol: Yes   Plan:   Increase heparin drip to 1700 units/hr  Daily CBC and heparin level  Ulice Dash, PharmD Clinical Pharmacist Pager # [phone]  09/08/2018,8:57 PM

## 2018-09-08 NOTE — Progress Notes (Signed)
Please note patient's daughter's phone number: [phone]

## 2018-09-09 DIAGNOSIS — R918 Other nonspecific abnormal finding of lung field: Secondary | ICD-10-CM

## 2018-09-09 LAB — BASIC METABOLIC PANEL
Anion gap: 11 (ref 5–15)
Anion gap: 12 (ref 5–15)
BUN: 27 mg/dL — AB (ref 8–23)
BUN: 30 mg/dL — ABNORMAL HIGH (ref 8–23)
CO2: 26 mmol/L (ref 22–32)
CO2: 24 mmol/L (ref 22–32)
Calcium: 10.2 mg/dL (ref 8.9–10.3)
Calcium: 10.9 mg/dL — ABNORMAL HIGH (ref 8.9–10.3)
Chloride: 83 mmol/L — ABNORMAL LOW (ref 98–111)
Chloride: 88 mmol/L — ABNORMAL LOW (ref 98–111)
Creatinine, Ser: 1.31 mg/dL — ABNORMAL HIGH (ref 0.61–1.24)
Creatinine, Ser: 1.39 mg/dL — ABNORMAL HIGH (ref 0.61–1.24)
GFR calc Af Amer: 57 mL/min — ABNORMAL LOW (ref >60)
GFR calc Af Amer: 53 mL/min — ABNORMAL LOW (ref >60)
GFR calc non Af Amer: 49 mL/min — ABNORMAL LOW (ref >60)
GFR calc non Af Amer: 46 mL/min — ABNORMAL LOW (ref >60)
GLUCOSE: 171 mg/dL — AB (ref 70–99)
Glucose, Bld: 182 mg/dL — ABNORMAL HIGH (ref 70–99)
Potassium: 4.4 mmol/L (ref 3.5–5.1)
Potassium: 4.5 mmol/L (ref 3.5–5.1)
SODIUM: 120 mmol/L — AB (ref 135–145)
Sodium: 124 mmol/L — ABNORMAL LOW (ref 135–145)

## 2018-09-09 LAB — CBC
HCT: 39.5 % (ref 39.0–52.0)
Hemoglobin: 13.3 g/dL (ref 13.0–17.0)
MCH: 27.7 pg (ref 26.0–34.0)
MCHC: 33.7 g/dL (ref 30.0–36.0)
MCV: 82.1 fL (ref 80.0–100.0)
PLATELETS: 433 10*3/uL — AB (ref 150–400)
RBC: 4.81 MIL/uL (ref 4.22–5.81)
RDW: 12.2 % (ref 11.5–15.5)
WBC: 11.6 10*3/uL — ABNORMAL HIGH (ref 4.0–10.5)
nRBC: 0 % (ref 0.0–0.2)

## 2018-09-09 LAB — PTH, INTACT AND CALCIUM

## 2018-09-09 LAB — GLUCOSE, CAPILLARY
Glucose-Capillary: 177 mg/dL — ABNORMAL HIGH (ref 70–99)
Glucose-Capillary: 225 mg/dL — ABNORMAL HIGH (ref 70–99)
Glucose-Capillary: 239 mg/dL — ABNORMAL HIGH (ref 70–99)
Glucose-Capillary: 228 mg/dL — ABNORMAL HIGH (ref 70–99)

## 2018-09-09 LAB — CULTURE, BLOOD (ROUTINE X 2)
CULTURE: NO GROWTH
Culture: NO GROWTH
Special Requests: ADEQUATE

## 2018-09-09 LAB — HEPARIN LEVEL (UNFRACTIONATED)
Heparin Unfractionated: 0.28 [IU]/mL — ABNORMAL LOW (ref 0.30–0.70)
Heparin Unfractionated: 0.45 IU/mL (ref 0.30–0.70)

## 2018-09-09 LAB — T3, FREE: T3, Free: 2.1 pg/mL (ref 2.0–4.4)

## 2018-09-09 LAB — OSMOLALITY, URINE: Osmolality, Ur: 253 mosm/kg — ABNORMAL LOW (ref 300–900)

## 2018-09-09 MED ORDER — HEPARIN (PORCINE) 25000 UT/250ML-% IV SOLN
1950.0000 [IU]/h | INTRAVENOUS | Status: DC
  Administered 2018-09-09 – 2018-09-10 (×2): 1950 [IU]/h via INTRAVENOUS
  Filled 2018-09-09 (×4): qty 250

## 2018-09-09 MED ORDER — LIP MEDEX EX OINT
TOPICAL_OINTMENT | CUTANEOUS | Status: AC
  Administered 2018-09-09: 17:00:00
  Filled 2018-09-09: qty 7

## 2018-09-09 MED ORDER — SODIUM CHLORIDE 1 G PO TABS
2.0000 g | ORAL_TABLET | Freq: Three times a day (TID) | ORAL | Status: DC
  Administered 2018-09-09 – 2018-09-14 (×15): 2 g via ORAL
  Filled 2018-09-09 (×15): qty 2

## 2018-09-09 MED ORDER — LEVOFLOXACIN 750 MG PO TABS
750.0000 mg | ORAL_TABLET | ORAL | Status: AC
  Administered 2018-09-10 – 2018-09-14 (×3): 750 mg via ORAL
  Filled 2018-09-09 (×5): qty 1

## 2018-09-09 NOTE — Progress Notes (Addendum)
ANTICOAGULATION CONSULT NOTE - Follow Up Consult  Pharmacy Consult for IV heparin Indication: pulmonary embolus  No Known Allergies  Patient Measurements: Height: 5\' 8"  (172.7 cm) Weight: 177 lb 7.5 oz (80.5 kg) IBW/kg (Calculated) : 68.4 Heparin Dosing Weight: actual  Vital Signs: Temp: 98.2 F (36.8 C) (02/09 0519) Temp Source: Oral (02/09 0519) BP: 145/93 (02/09 0519) Pulse Rate: 100 (02/09 0519)  Labs: Recent Labs    09/07/18 0609 09/08/18 0019 09/08/18 0959 09/08/18 2017 09/09/18 0624  HGB  --   --  12.9*  --  13.3  HCT  --   --  37.7*  --  39.5  PLT  --   --  335  --  433*  APTT  --  45*  --   --   --   LABPROT  --  15.0  --   --   --   INR  --  1.19  --   --   --   HEPARINUNFRC  --   --  0.22* 0.28* 0.28*  CREATININE 1.43* 1.35*  --   --  1.31*    Estimated Creatinine Clearance: 39.9 mL/min (A) (by C-G formula based on SCr of 1.31 mg/dL (H)).   Medical History: Past Medical History:  Diagnosis Date  . Bipolar 1 disorder ([place])   . BPH (benign prostatic hyperplasia)   . Cancer ([place])    RCC  . Cataract   . Chronic kidney disease   . COPD (chronic obstructive pulmonary disease) ([place])   . Depression   . Diabetes mellitus without complication ([place])   . Hypertension     Medications:  Scheduled:  . amLODipine  2.5 mg Oral Daily  . cholecalciferol  1,000 Units Oral Daily  . feeding supplement (ENSURE ENLIVE)  237 mL Oral BID BM  . furosemide  20 mg Intravenous Q12H  . insulin aspart  0-5 Units Subcutaneous QHS  . insulin aspart  0-9 Units Subcutaneous TID WC  . latanoprost  1 drop Both Eyes QHS  . OLANZapine  2.5 mg Oral QHS  . sodium chloride  2 g Oral BID WC   Infusions:  . heparin 1,700 Units/hr (09/08/18 2231)  . levofloxacin (LEVAQUIN) IV 750 mg (09/08/18 1125)    Assessment: 24 yoM admitted 2/4 with generalized weakness, hx of metastatic renal cell carcinoma now with elevated d-dimer and intermediate probability for PE per perfusion lung  scan. Dopplers to rule out DVT. CTa ordered.  Today, 09/09/2018  Heparin level subtherapeutic (0.28) despite rate increase to 1700 units/hr  CBC: Hgb 13.3 (stable), Plts wnl  SCr: 1.31 (improved)  No bleeding or complications reported    Goal of Therapy:  Heparin level 0.3-0.7 units/ml Monitor platelets by anticoagulation protocol: Yes   Plan:   Increase heparin drip to 1950 units/hr  Recheck heparin level in 8hr  Daily CBC and heparin level  Peggyann Juba, PharmD, BCPS Pager: [phone] 09/09/2018,8:09 AM   Addendum: Repeat heparin level therapeutic (0.45) on 1950 units/hr  No bleeding reported  Plan:  Continue current heparin rate  F/u heparin level in AM  Peggyann Juba, PharmD, BCPS 09/09/2018 6:03 PM

## 2018-09-09 NOTE — Progress Notes (Signed)
NAME:  Blake Hines, MRN:  [id number], DOB:  07/21/1931, LOS: 5 ADMISSION DATE:  09/04/2018, CONSULTATION DATE:  09/08/2018 REFERRING MD:  [facility] - [place], CHIEF COMPLAINT:  Hypoxemic respiratory failure   Brief History   85 year old male with history of renal cell carcinoma with mets to the lung who presents with hypoxemic respiratory failure from PCP's office. The patient is a rather poor historian so history is obtained from the records.  He did however endorse fever at home prior to coming to the hospital and non-productive cough.  History of present illness   85 year old male with history of renal cell carcinoma with mets to the lung who presents with hypoxemic respiratory failure from PCP's office. The patient is a rather poor historian so history is obtained from the records.  He did however endorse fever at home prior to coming to the hospital and non-productive cough.  Past Medical History  Renal cell CA  Significant Hospital Events   N/A  Consults:  PCCM  Procedures:  N/A  Significant Diagnostic Tests:  CXR with pulmonary infiltrate  Micro Data:  Blood 2/8>>>  Antimicrobials:  Levaquin 2/8>>>   Interim history/subjective:  No events overnight, no new complaints  Objective   Blood pressure (!) 145/93, pulse (!) 117, temperature 98.2 F (36.8 C), temperature source Oral, resp. rate 19, height 5\' 8"  (1.727 m), weight 80.5 kg, SpO2 93 %.        Intake/Output Summary (Last 24 hours) at 09/09/2018 1108 Last data filed at 09/09/2018 2979 Gross per 24 hour  Intake 960 ml  Output 2900 ml  Net -1940 ml   Filed Weights   09/04/18 0950  Weight: 80.5 kg    Examination: General: Elderly male, NAD on 2L [place] HENT: [place]/AT, PERRL, EOM-I and MMM Lungs: Bibasilar crackles Cardiovascular: RRR, Nl S1/S2 and -M/R/G Abdomen: Soft, NT, ND and +BS Extremities: -edema and -tenderness Neuro: Alert and interactive, slow to respond but moving all ext to command Skin: Intact  I  reviewed chest CT myself, PE noted  Resolved Hospital Problem list   Discussed with [facility]-MD  Assessment & Plan:  85 year old male with PMH of renal cell carcinoma presenting with hypoxemic respiratory failure and pulmonary infiltrate.  VQ with intermediate probability.  Hypoxemia:  - Titrate O2 for sat of 88-92%  - Will need an ambulatory desaturation study prior to discharge for home O2  CAP:  - F/u on cultures  - PCT 0.15  - D/C Levaquin  PE:  - Check DVT negative  - Continue full anti-coagulation  - Recommend start NOAC now  - Echo noted  PCCM will sign off, please call back if needed   Labs   CBC: Recent Labs  Lab 09/04/18 1015 09/05/18 0640 09/08/18 0959 09/09/18 0624  WBC 10.8* 9.4 10.6* 11.6*  NEUTROABS 8.5*  --   --   --   HGB 13.2 12.0* 12.9* 13.3  HCT 39.6 36.2* 37.7* 39.5  MCV 83.0 85.6 83.6 82.1  PLT 364 317 335 433*    Basic Metabolic Panel: Recent Labs  Lab 09/06/18 0559 09/06/18 1144 09/07/18 0609 09/08/18 0019 09/09/18 0624  NA 123* 122* 125* 120* 120*  K 4.4 4.2 4.3 4.2 4.4  CL 93* 90* 90* 84* 83*  CO2 22 24 27 27 26   GLUCOSE 142* 229* 166* 177* 171*  BUN 27* 26* 29* 29* 27*  CREATININE 1.38* 1.47* 1.43* 1.35* 1.31*  CALCIUM 10.5* 10.2 10.7* 10.5* 10.2  GFR: Estimated Creatinine Clearance: 39.9 mL/min (A) (by C-G formula based on SCr of 1.31 mg/dL (H)). Recent Labs  Lab 09/04/18 1015 09/05/18 0640 09/06/18 1153 09/08/18 0959 09/09/18 0624  PROCALCITON  --   --  0.10 0.15  --   WBC 10.8* 9.4  --  10.6* 11.6*  LATICACIDVEN 1.0  --   --   --   --     Liver Function Tests: Recent Labs  Lab 09/04/18 1015 09/05/18 0640 09/07/18 0609  AST 70* 56* 55*  ALT 200* 148* 136*  ALKPHOS 59 49 47  BILITOT 0.9 0.7 0.8  PROT 6.9 5.8* 5.9*  ALBUMIN 3.1* 2.6* 2.6*   No results for input(s): LIPASE, AMYLASE in the last 168 hours. No results for input(s): AMMONIA in the last 168 hours.  ABG No results found for: PHART, PCO2ART,  PO2ART, HCO3, TCO2, ACIDBASEDEF, O2SAT   Coagulation Profile: Recent Labs  Lab 09/08/18 0019  INR 1.19    Cardiac Enzymes: No results for input(s): CKTOTAL, CKMB, CKMBINDEX, TROPONINI in the last 168 hours.  HbA1C: No results found for: HGBA1C  CBG: Recent Labs  Lab 09/08/18 0805 09/08/18 1228 09/08/18 1701 09/08/18 2011 09/09/18 0733  GLUCAP 174* 213* 222* 139* 177*    Review of Systems:   Negative other than above  Past Medical History  He,  has a past medical history of Bipolar 1 disorder ([place]), BPH (benign prostatic hyperplasia), Cancer ([place]), Cataract, Chronic kidney disease, COPD (chronic obstructive pulmonary disease) ([place]), Depression, Diabetes mellitus without complication ([place]), and Hypertension.   Surgical History   History reviewed. No pertinent surgical history.   Social History   reports that he quit smoking about 15 years ago. His smoking use included cigarettes. He has a 20.00 pack-year smoking history. He has never used smokeless tobacco. He reports current alcohol use.   Family History   His family history is not on file.   Allergies No Known Allergies   Home Medications  Prior to Admission medications   Medication Sig Start Date End Date Taking? Authorizing Provider  ANORO ELLIPTA 62.5-25 MCG/INH AEPB Inhale 1 puff into the lungs daily.  06/19/18  Yes [provider]  cholecalciferol (VITAMIN D3) 25 MCG (1000 UT) tablet Take 1,000 Units by mouth daily.   Yes [provider]  Coenzyme Q10 (COQ10) 200 MG CAPS Take 200 mg by mouth daily.    Yes [provider]  fenofibrate (TRICOR) 48 MG tablet Take 48 mg by mouth daily.   Yes [provider]  hydrochlorothiazide (HYDRODIURIL) 25 MG tablet Take 25 mg by mouth daily.   Yes [provider]  insulin lispro (HUMALOG KWIKPEN) 100 UNIT/ML KwikPen Inject 4 Units into the skin every evening. With dinner    Yes [provider]  latanoprost (XALATAN)  0.005 % ophthalmic solution Place 1 drop into both eyes at bedtime.   Yes [provider]  losartan (COZAAR) 100 MG tablet Take 100 mg by mouth daily.   Yes [provider]  Multiple Vitamins-Minerals (MULTIVITAMIN WITH MINERALS) tablet Take 1 tablet by mouth daily.   Yes [provider]  OLANZapine (ZYPREXA) 2.5 MG tablet Take 2.5 mg by mouth at bedtime.   Yes [provider]  sertraline (ZOLOFT) 50 MG tablet Take 50 mg by mouth daily.   Yes [provider]  sitaGLIPtin (JANUVIA) 50 MG tablet Take 50 mg by mouth every morning.   Yes [provider]  TOUJEO SOLOSTAR 300 UNIT/ML SOPN Inject 6 Units  into the skin daily.  08/19/18  Yes [provider]  FREESTYLE LITE test strip USE TO TEST BLOOD SUGAR TWICE DAILY (E11.29) 07/03/18   [provider]    Rush Farmer, M.D. [facility] Pulmonary/Critical Care Medicine. Pager: [phone]. After hours pager: [phone].

## 2018-09-09 NOTE — Progress Notes (Addendum)
PROGRESS NOTE   Blake Hines  WCH:[id number]    DOB: June 19, 1931    DOA: 09/04/2018  PCP: Crist Infante, MD   I have briefly reviewed patients previous medical records in [facility].  Brief Narrative:  85 year old male with PMH of metastatic renal cell carcinoma, status post left nephrectomy, on immunotherapy, last cycle approximately 3 weeks ago, stage III chronic kidney disease, COPD, DM 2, HTN, bipolar disorder, presented with ongoing low-grade fever with chills and mild dyspnea at home, decreased appetite and progressive generalized weakness to an extent where he was unable to do his daily activities.  In ED sodium 124, creatinine 1.63.  Admitted for dehydration with hyponatremia and acute on chronic kidney disease.  Hydrated initially with IV saline, dehydration resolved but then his hyponatremia worsened.  Work-up suggested SIADH, fluid restricted, oral Lasix low-dose initiated, sodium improved to 125 but then again worsened to 120.  Patient has dyspnea, hypoxia, persistent sinus tachycardia on telemetry, reported 2 episodes of coughing up small blood clots prior to admission, relatively nonambulatory, underlying cancer diagnosis, concern for pulmonary embolism.  D-dimer positive, VQ scan intermediate study, lower extremity venous Dopplers negative for DVT, TTE without right heart strain.  CTA chest 2/8 confirmed acute PE, possible multifocal pneumonia, superimposed edema, increased mediastinal and hilar adenopathy, pulmonary metastasis.   Assessment & Plan:   Active Problems:   Hyponatremia   AKI (acute kidney injury) ([place])   Type 2 diabetes mellitus without complication (HCC)   Bipolar disorder (HCC)   Abnormal liver function test   1. Dehydration with hyponatremia: Suspect due to poor oral intake related to recent immunotherapy, ongoing HCTZ use and also on Zoloft which could cause hyponatremia.  HCTZ and Zoloft discontinued.  Dehydration resolved. 2. Hyponatremia/suspected SIADH:  At baseline patient has sodiums in the mid 20s.  Admission sodium 124, briefly improved with IV saline then dropped to 120.  HCTZ and Zoloft discontinued.  IV saline stopped.  Initial urine and serum osmolarity and urine sodium consistent with SIADH.  Low-dose Lasix started.  Nephrology consulted and added salt tablets.  Sodium remains at 120 but no mental status changes.  Continue Lasix, salt tablets and fluid restriction.  Follow BMP in a.m.   3. Acute respiratory failure with hypoxia: Patient has dyspnea, hypoxia, persistent sinus tachycardia on telemetry, reported 2 episodes of coughing up small blood clots prior to admission, relatively nonambulatory, underlying cancer diagnosis, concern for pulmonary embolism.  D-dimer positive, VQ scan intermediate study, lower extremity venous Dopplers negative for DVT, TTE without right heart strain.  CTA chest 2/8 confirmed acute PE, possible multifocal pneumonia, superimposed edema, increased mediastinal and hilar adenopathy, pulmonary metastasis.  Treat underlying etiology and monitor closely. 4. Acute pulmonary embolism: Continue IV heparin for additional 24 hours and then transition to NOAC> discuss with oncology in a.m. 5. Multifocal pneumonia: Seen on CTA chest.  Procalcitonin 0.1 > 0.15.  Continue levofloxacin. 6. Hypercalcemia, mild: Likely complicated by dehydration.  Uncorrected calcium on admission was 11.4, down to 10.5.  Improved. 7. Acute on stage III chronic kidney disease: Baseline creatinine may be in the 1.6 range.  Presented with creatinine of 1.91.  Post hydration this is improved to 1.3 and stable.  Continue to trend BMP. 8. DM2 with renal complications: Hold Januvia.  Continue NovoLog SSI.  Mildly uncontrolled and fluctuating at times.  Continue current regimen without change. 9. Essential hypertension: HCTZ discontinued indefinitely.  Losartan held due to SIADH.  PRN IV hydralazine.  Started amlodipine 2.5 mg  daily.  Better. 10. COPD: Stable  without clinical bronchospasm. 11. Hyperlipidemia: Continue TriCor. 12. Bipolar disorder: Stable.  Continue Zyprexa 2.5 mg at bedtime. 13. Abnormal LFTs: No GI symptoms other than decreased appetite.  Unclear etiology but seem to be improving.?  Related to cancer treatment.  Outpatient follow-up. 14. Metastatic renal cell carcinoma: Outpatient follow-up with Dr. Julien Nordmann.  CTA chest shows pulmonary nodules, increasing mediastinal and hilar adenopathy.  Discuss with patient, family and Dr. Julien Nordmann in a.m. regarding palliative care consultation for goals of care. 15. ST: Likely related to acute lung issues noted above.  Asymptomatic.  Please see discussion above.  TSH >10.  Free T3 and free T4: WNL.?  Sick euthyroid.  Consider repeating TSH in 4 to 6 weeks.   DVT prophylaxis: Lovenox Code Status: Full, confirmed again with patient today. Family Communication: I discussed in detail with patient's daughter, updated all care and answered questions.  She stated that patient has a follow-up appointment with his PCP on 2/11.  She was contemplating palliative care consultation. Disposition: To be determined pending clinical improvement.   Consultants:  Pulmonology Nephrology  Procedures:  None  Antimicrobials:  Levofloxacin  Subjective: Patient states that he feels "okay".  Actually denies dyspnea.  No cough or hemoptysis.  No chest pain reported.  As per RN, persistent asymptomatic sinus tachycardia in the 120s.  ROS: As above.  Objective:  Vitals:   09/08/18 2008 09/08/18 2056 09/09/18 0519 09/09/18 0913  BP: (!) 143/95  (!) 145/93   Pulse: (!) 118 98 100 (!) 117  Resp: 18  18 19   Temp: 97.6 F (36.4 C)  98.2 F (36.8 C)   TempSrc: Oral  Oral   SpO2: 93%  93%   Weight:      Height:        Examination:  General exam: Pleasant elderly male, moderately built and nourished, lying comfortably propped up in bed.  Does not appear in any distress.  Oral mucosa moist. Respiratory  system: Slightly harsh breath sounds bilaterally but no obvious wheezing, rhonchi.  Occasional basal crackles.  Intermittent pursed lip breathing. Cardiovascular system: S1 & S2 heard, RRR. No JVD, murmurs, rubs, gallops or clicks. No pedal edema.  Telemetry personally reviewed: Sinus tachycardia in the 120s. Gastrointestinal system: Abdomen is nondistended, soft and nontender. No organomegaly or masses felt. Normal bowel sounds heard.  Stable Central nervous system: Alert and oriented person and place and partly to time. No focal neurological deficits.  Stable Extremities: Symmetric 5 x 5 power. Skin: No rashes, lesions or ulcers Psychiatry: Judgement and insight appear impaired. Mood & affect appropriate.     Data Reviewed: I have personally reviewed following labs and imaging studies  CBC: Recent Labs  Lab 09/04/18 1015 09/05/18 0640 09/08/18 0959 09/09/18 0624  WBC 10.8* 9.4 10.6* 11.6*  NEUTROABS 8.5*  --   --   --   HGB 13.2 12.0* 12.9* 13.3  HCT 39.6 36.2* 37.7* 39.5  MCV 83.0 85.6 83.6 82.1  PLT 364 317 335 448*   Basic Metabolic Panel: Recent Labs  Lab 09/06/18 0559 09/06/18 1144 09/07/18 0609 09/08/18 0019 09/09/18 0624  NA 123* 122* 125* 120* 120*  K 4.4 4.2 4.3 4.2 4.4  CL 93* 90* 90* 84* 83*  CO2 22 24 27 27 26   GLUCOSE 142* 229* 166* 177* 171*  BUN 27* 26* 29* 29* 27*  CREATININE 1.38* 1.47* 1.43* 1.35* 1.31*  CALCIUM 10.5* 10.2 10.7* 10.5* 10.2   Liver Function Tests: Recent Labs  Lab 09/04/18 1015 09/05/18 0640 09/07/18 0609  AST 70* 56* 55*  ALT 200* 148* 136*  ALKPHOS 59 49 47  BILITOT 0.9 0.7 0.8  PROT 6.9 5.8* 5.9*  ALBUMIN 3.1* 2.6* 2.6*   CBG: Recent Labs  Lab 09/08/18 1228 09/08/18 1701 09/08/18 2011 09/09/18 0733 09/09/18 1127  GLUCAP 213* 222* 139* 177* 225*    Recent Results (from the past 240 hour(s))  Blood culture (routine x 2)     Status: None (Preliminary result)   Collection Time: 09/04/18 10:39 AM  Result Value Ref  Range Status   Specimen Description   Final    BLOOD BLOOD LEFT FOREARM Performed at [facility], [place] [address], [place], [place] [address]    Special Requests   Final    BOTTLES DRAWN AEROBIC AND ANAEROBIC Blood Culture results may not be optimal due to an excessive volume of blood received in culture bottles Performed at [place] [address] [place], [place] [address]    Culture   Final    NO GROWTH 4 DAYS Performed at [facility], [place] [address], [place], [place] [address]    Report Status PENDING  Incomplete  Urine culture     Status: None   Collection Time: 09/04/18 11:10 AM  Result Value Ref Range Status   Specimen Description   Final    URINE, RANDOM Performed at [place] [address], [place], [place] [address]    Special Requests   Final    NONE Performed at [facility], [place] [address], [place], [place] [address]    Culture   Final    NO GROWTH Performed at [facility], [place] [address], [place], [place] [address]    Report Status 09/05/2018 FINAL  Final  Blood culture (routine x 2)     Status: None (Preliminary result)   Collection Time: 09/04/18 11:53 AM  Result Value Ref Range Status   Specimen Description   Final    BLOOD LEFT ANTECUBITAL Performed at [place] [address] [place], [place] [address]    Special Requests   Final    BOTTLES DRAWN AEROBIC AND ANAEROBIC Blood Culture adequate volume Performed at [place] [address], [place], [place] [address]    Culture   Final    NO GROWTH 4 DAYS Performed at [facility], [place] [address] [place], [place] [address]    Report Status PENDING  Incomplete         Radiology Studies: Ct Angio Chest Pe W Or Wo Contrast  Result Date: 09/08/2018 CLINICAL DATA:  History of metastatic renal cell carcinoma. EXAM: CT ANGIOGRAPHY CHEST WITH CONTRAST  TECHNIQUE: Multidetector CT imaging of the chest was performed using the standard protocol during bolus administration of intravenous contrast. Multiplanar CT image reconstructions and MIPs were obtained to evaluate the vascular anatomy. CONTRAST:  5mL ISOVUE-370 IOPAMIDOL (ISOVUE-370) INJECTION 76% COMPARISON:  PET-CT 08/10/2018 FINDINGS: Cardiovascular: Normal heart size. Small pericardial effusion. Aorta and main pulmonary artery normal in caliber. Thoracic aortic vascular calcifications. Nonocclusive embolus demonstrated within the distal right lower lobe pulmonary artery (image 164; series 5). Additionally embolus is demonstrated within the right middle lobe segmental and subsegmental pulmonary arteries. Small amount of embolic material demonstrated within the right upper lobe pulmonary arteries. RV/LV ratio less than 1. Mediastinum/Nodes: Re demonstrated extensive mediastinal and hilar adenopathy. Slight interval increase in size of subcarinal lymph  node measuring 4.7 cm (image 53; series 4), previously 4.2 cm. Similar-appearing 2.5 cm left inferior hilar lymph node (image 65; series 4), previously 2.5 cm. Interval increase in size of left hilar lymph node measuring 1.5 cm (image 53; series 4), previously 1.0 cm. Lungs/Pleura: Similar-appearing scattered pulmonary nodules. Interval development of masslike area of consolidation within the left lower lobe measuring 6.1 x 3.9 cm. Additionally there is new predominately subpleural ground-glass and consolidative opacities throughout the left lung and right lower lobe. Upper Abdomen: No acute process. Musculoskeletal: Thoracic spine degenerative changes. No aggressive or acute appearing osseous lesions identified. Review of the MIP images confirms the above findings. IMPRESSION: 1. Findings positive for pulmonary embolus predominately within the right middle and right lower lobes as well as right upper lobe. 2. Interval development of a large (6 cm) masslike area  of consolidation within the left lower lobe. Additionally there is new patchy predominately subpleural ground-glass and consolidative opacities throughout the left lung and right lower lobe. Overall findings are concerning for multifocal infectious process. Possibility of superimposed edema not excluded. 3. Interval increase in size of mediastinal and hilar adenopathy. 4. Re demonstrated scattered pulmonary nodules compatible with known metastatic disease. 5. Critical Value/emergent results were called by telephone at the time of interpretation on 09/08/2018 at 6:54 pm to Dr. Vernell Leep , who verbally acknowledged these results. Electronically Signed   By: Lovey Newcomer M.D.   On: 09/08/2018 18:56   Nm Pulmonary Perf And Vent  Result Date: 09/07/2018 CLINICAL DATA:  Stage IV renal cell carcinoma. Elevated D-dimer. Clinical concern for pulmonary embolism. COPD. EXAM: NUCLEAR MEDICINE VENTILATION - PERFUSION LUNG SCAN TECHNIQUE: Ventilation images were obtained in multiple projections using inhaled aerosol Tc-53m DTPA. Perfusion images were obtained in multiple projections after intravenous injection of Tc-60m MAA. RADIOPHARMACEUTICALS:  30.2 mCi of Tc-35m DTPA aerosol inhalation and 4.2 mCi Tc88m MAA IV COMPARISON:  09/06/2018 chest radiograph. FINDINGS: Ventilation: Limited heterogeneous radiotracer throughout both lungs on the ventilation images, with central airway radiotracer accumulation. Perfusion: Numerous large matched segmental perfusion defects throughout both lungs, including within the lower lung zones. IMPRESSION: Intermediate probability for pulmonary embolism (20-79%) by PIOPED II criteria. Electronically Signed   By: Ilona Sorrel M.D.   On: 09/07/2018 23:23   Vas [place] Lower Extremity Venous (dvt)  Result Date: 09/08/2018  Lower Venous Study Indications: Pulmonary embolism.  Performing Technologist: Abram Sander RVS  Examination Guidelines: A complete evaluation includes B-mode imaging, spectral  Doppler, color Doppler, and power Doppler as needed of all accessible portions of each vessel. Bilateral testing is considered an integral part of a complete examination. Limited examinations for reoccurring indications may be performed as noted.  Right Venous Findings: +---------+---------------+---------+-----------+----------+-------+          CompressibilityPhasicitySpontaneityPropertiesSummary +---------+---------------+---------+-----------+----------+-------+ CFV      Full           Yes      Yes                          +---------+---------------+---------+-----------+----------+-------+ SFJ      Full                                                 +---------+---------------+---------+-----------+----------+-------+ FV Prox  Full                                                 +---------+---------------+---------+-----------+----------+-------+  FV Mid   Full                                                 +---------+---------------+---------+-----------+----------+-------+ FV DistalFull                                                 +---------+---------------+---------+-----------+----------+-------+ PFV      Full                                                 +---------+---------------+---------+-----------+----------+-------+ POP      Full           Yes      Yes                          +---------+---------------+---------+-----------+----------+-------+ PTV      Full                                                 +---------+---------------+---------+-----------+----------+-------+ PERO     Full                                                 +---------+---------------+---------+-----------+----------+-------+  Left Venous Findings: +---------+---------------+---------+-----------+----------+-------+          CompressibilityPhasicitySpontaneityPropertiesSummary +---------+---------------+---------+-----------+----------+-------+  CFV      Full           Yes      Yes                          +---------+---------------+---------+-----------+----------+-------+ SFJ      Full                                                 +---------+---------------+---------+-----------+----------+-------+ FV Prox  Full                                                 +---------+---------------+---------+-----------+----------+-------+ FV Mid   Full                                                 +---------+---------------+---------+-----------+----------+-------+ FV DistalFull                                                 +---------+---------------+---------+-----------+----------+-------+  PFV      Full                                                 +---------+---------------+---------+-----------+----------+-------+ POP      Full           Yes      Yes                          +---------+---------------+---------+-----------+----------+-------+ PTV      Full                                                 +---------+---------------+---------+-----------+----------+-------+ PERO     Full                                                 +---------+---------------+---------+-----------+----------+-------+    Summary: Right: There is no evidence of deep vein thrombosis in the lower extremity. No cystic structure found in the popliteal fossa. Left: There is no evidence of deep vein thrombosis in the lower extremity. No cystic structure found in the popliteal fossa.  *See table(s) above for measurements and observations. Electronically signed by Monica Martinez MD on 09/08/2018 at 10:29:12 AM.    Final         Scheduled Meds: . amLODipine  2.5 mg Oral Daily  . cholecalciferol  1,000 Units Oral Daily  . feeding supplement (ENSURE ENLIVE)  237 mL Oral BID BM  . furosemide  20 mg Intravenous Q12H  . insulin aspart  0-5 Units Subcutaneous QHS  . insulin aspart  0-9 Units Subcutaneous TID WC    . latanoprost  1 drop Both Eyes QHS  . lip balm      . OLANZapine  2.5 mg Oral QHS  . sodium chloride  2 g Oral TID WC   Continuous Infusions: . heparin 1,950 Units/hr (09/09/18 0902)     LOS: 5 days     Vernell Leep, MD, FACP, [facility]. [facility]  To contact the attending provider between 7A-7P or the covering provider during after hours 7P-7A, please log into the web site [email or web address] and access using universal  password for that web site. If you do not have the password, please call the hospital operator.  09/09/2018, 2:15 PM

## 2018-09-09 NOTE — Progress Notes (Signed)
[facility]    NEPHROLOGY PROGRESS NOTE  SUBJECTIVE: Feeling well tonight.  Denies chest pain, shortness of breath, nausea, vomiting, diarrhea or dysuria.  Excellent urine output noted.  All other review of systems are negative.     OBJECTIVE:  Vitals:   09/09/18 1548 09/09/18 1605  BP: (!) 154/93   Pulse: (!) 112 (!) 112  Resp: (!) 22 20  Temp: 97.6 F (36.4 C)   SpO2: 93%     Intake/Output Summary (Last 24 hours) at 09/09/2018 1850 Last data filed at 09/09/2018 4098 Gross per 24 hour  Intake 240 ml  Output 1200 ml  Net -960 ml      [place]:  AAOx3 NAD HEENT: MMM [place] AT anicteric sclera Neck:  No JVD, no adenopathy CV:  Heart RRR  Lungs:  L/S CTA bilaterally Abd:  abd SNT/ND with normal BS GU:  Bladder non-palpable Extremities:  No LE edema. Skin:  No skin rash  MEDICATIONS:  . amLODipine  2.5 mg Oral Daily  . cholecalciferol  1,000 Units Oral Daily  . feeding supplement (ENSURE ENLIVE)  237 mL Oral BID BM  . furosemide  20 mg Intravenous Q12H  . insulin aspart  0-5 Units Subcutaneous QHS  . insulin aspart  0-9 Units Subcutaneous TID WC  . latanoprost  1 drop Both Eyes QHS  . [START ON 09/10/2018] levofloxacin  750 mg Oral Q48H  . OLANZapine  2.5 mg Oral QHS  . sodium chloride  2 g Oral TID WC       LABS:   CBC Latest Ref Rng & Units 09/09/2018 09/08/2018 09/05/2018  WBC 4.0 - 10.5 K/uL 11.6(H) 10.6(H) 9.4  Hemoglobin 13.0 - 17.0 g/dL 13.3 12.9(L) 12.0(L)  Hematocrit 39.0 - 52.0 % 39.5 37.7(L) 36.2(L)  Platelets 150 - 400 K/uL 433(H) 335 317    CMP Latest Ref Rng & Units 09/09/2018 09/09/2018 09/08/2018  Glucose 70 - 99 mg/dL 171(H) - 177(H)  BUN 8 - 23 mg/dL 27(H) - 29(H)  Creatinine 0.61 - 1.24 mg/dL 1.31(H) - 1.35(H)  Sodium 135 - 145 mmol/L 120(L) - 120(L)  Potassium 3.5 - 5.1 mmol/L 4.4 - 4.2  Chloride 98 - 111 mmol/L 83(L) - 84(L)  CO2 22 - 32 mmol/L 26 - 27  Calcium mg/dL 10.2 REJ5 10.5(H)  Total Protein 6.5 - 8.1 g/dL - - -  Total  Bilirubin 0.3 - 1.2 mg/dL - - -  Alkaline Phos 38 - 126 U/L - - -  AST 15 - 41 U/L - - -  ALT 0 - 44 U/L - - -    Lab Results  Component Value Date   PTH NOT PERFORMED 09/09/2018   CALCIUM 10.2 09/09/2018   CALCIUM REJ5 09/09/2018       Component Value Date/Time   COLORURINE YELLOW 09/04/2018 1110   APPEARANCEUR CLEAR 09/04/2018 1110   LABSPEC 1.011 09/04/2018 1110   PHURINE 7.0 09/04/2018 1110   GLUCOSEU NEGATIVE 09/04/2018 1110   HGBUR NEGATIVE 09/04/2018 1110   [place] 09/04/2018 1110   [place] 09/04/2018 1110   PROTEINUR NEGATIVE 09/04/2018 1110   NITRITE NEGATIVE 09/04/2018 1110   LEUKOCYTESUR NEGATIVE 09/04/2018 1110   No results found for: PHART, PCO2ART, PO2ART, HCO3, TCO2, ACIDBASEDEF, O2SAT  No results found for: IRON, TIBC, FERRITIN, IRONPCTSAT     ASSESSMENT/PLAN:      1.  Severe hyponatremia.  Likely secondary to combination of the hydrochlorothiazide and Zoloft.  Cannot rule out malignancy as an underlying cause of SIADH.  Urine electrolytes more closely mimic electrolytes that we would expect while on hydrochlorothiazide.  Takes quite a few days for the effect of hydrochlorothiazide to fully be removed.  Would treat with salt tablets and furosemide for now.  Hydrochlorothiazide is now absolutely contraindicated for future use large amount a urine output today suggest improvement in his sodium.  We will recheck today to assure appropriate rate of correction..  2.  Chronic kidney disease stage III.  Baseline serum creatinine appears to be in the 1.6 range based on prior labs.  His serum creatinine is better than his previously stated baseline.  Appears a bit fluid overloaded on examination.  Would use furosemide to manage his volume as well as the sodium.  Chronic kidney disease likely in the basis of longstanding diabetes and hypertension.  Hold off on ACE inhibition for now.  3.  Metastatic renal cancer.  4.  Rule out pulmonary embolism.   CT angiogram is pending.  5.  Hypercalcemia.  Likely secondary to underlying metastatic renal cancer.  Will check PTH.  Continue vitamin D for now.     [place], DO, [place]

## 2018-09-10 DIAGNOSIS — E222 Syndrome of inappropriate secretion of antidiuretic hormone: Principal | ICD-10-CM

## 2018-09-10 LAB — BASIC METABOLIC PANEL
ANION GAP: 9 (ref 5–15)
BUN: 33 mg/dL — ABNORMAL HIGH (ref 8–23)
CHLORIDE: 87 mmol/L — AB (ref 98–111)
CO2: 28 mmol/L (ref 22–32)
Calcium: 11.2 mg/dL — ABNORMAL HIGH (ref 8.9–10.3)
Creatinine, Ser: 1.47 mg/dL — ABNORMAL HIGH (ref 0.61–1.24)
GFR calc Af Amer: 50 mL/min — ABNORMAL LOW (ref >60)
GFR calc non Af Amer: 43 mL/min — ABNORMAL LOW (ref >60)
Glucose, Bld: 197 mg/dL — ABNORMAL HIGH (ref 70–99)
Potassium: 4.4 mmol/L (ref 3.5–5.1)
Sodium: 124 mmol/L — ABNORMAL LOW (ref 135–145)

## 2018-09-10 LAB — CBC
HEMATOCRIT: 40.3 % (ref 39.0–52.0)
HEMOGLOBIN: 13.2 g/dL (ref 13.0–17.0)
MCH: 27.6 pg (ref 26.0–34.0)
MCHC: 32.8 g/dL (ref 30.0–36.0)
MCV: 84.1 fL (ref 80.0–100.0)
Platelets: 420 10*3/uL — ABNORMAL HIGH (ref 150–400)
RBC: 4.79 MIL/uL (ref 4.22–5.81)
RDW: 12.3 % (ref 11.5–15.5)
WBC: 10.8 10*3/uL — ABNORMAL HIGH (ref 4.0–10.5)
nRBC: 0 % (ref 0.0–0.2)

## 2018-09-10 LAB — GLUCOSE, CAPILLARY
GLUCOSE-CAPILLARY: 236 mg/dL — AB (ref 70–99)
GLUCOSE-CAPILLARY: 207 mg/dL — AB (ref 70–99)
Glucose-Capillary: 213 mg/dL — ABNORMAL HIGH (ref 70–99)
Glucose-Capillary: 264 mg/dL — ABNORMAL HIGH (ref 70–99)

## 2018-09-10 LAB — HEPARIN LEVEL (UNFRACTIONATED): Heparin Unfractionated: 0.45 IU/mL (ref 0.30–0.70)

## 2018-09-10 MED ORDER — ENOXAPARIN SODIUM 80 MG/0.8ML ~~LOC~~ SOLN
1.0000 mg/kg | Freq: Once | SUBCUTANEOUS | Status: AC
  Administered 2018-09-11: 80 mg via SUBCUTANEOUS
  Filled 2018-09-10: qty 0.8

## 2018-09-10 MED ORDER — ENOXAPARIN (LOVENOX) PATIENT EDUCATION KIT
PACK | Freq: Once | Status: AC
  Administered 2018-09-10: 16:00:00
  Filled 2018-09-10: qty 1

## 2018-09-10 MED ORDER — FUROSEMIDE 20 MG PO TABS
20.0000 mg | ORAL_TABLET | Freq: Two times a day (BID) | ORAL | Status: DC
  Administered 2018-09-10 – 2018-09-11 (×2): 20 mg via ORAL
  Filled 2018-09-10 (×2): qty 1

## 2018-09-10 MED ORDER — INSULIN GLARGINE 100 UNIT/ML ~~LOC~~ SOLN
5.0000 [IU] | Freq: Every day | SUBCUTANEOUS | Status: DC
  Administered 2018-09-10 – 2018-09-12 (×3): 5 [IU] via SUBCUTANEOUS
  Filled 2018-09-10 (×3): qty 0.05

## 2018-09-10 MED ORDER — ENOXAPARIN SODIUM 80 MG/0.8ML ~~LOC~~ SOLN
1.0000 mg/kg | Freq: Once | SUBCUTANEOUS | Status: AC
  Administered 2018-09-10: 80 mg via SUBCUTANEOUS
  Filled 2018-09-10: qty 0.8

## 2018-09-10 MED ORDER — ENOXAPARIN SODIUM 80 MG/0.8ML ~~LOC~~ SOLN
1.0000 mg/kg | Freq: Two times a day (BID) | SUBCUTANEOUS | Status: DC
  Administered 2018-09-11 (×2): 80 mg via SUBCUTANEOUS
  Filled 2018-09-10 (×2): qty 0.8

## 2018-09-10 NOTE — Progress Notes (Signed)
Inpatient Diabetes Program Recommendations  AACE/ADA: New Consensus Statement on Inpatient Glycemic Control (2015)  Target Ranges:  Prepandial:   less than 140 mg/dL      Peak postprandial:   less than 180 mg/dL (1-2 hours)      Critically ill patients:  140 - 180 mg/dL   Lab Results  Component Value Date   GLUCAP 213 (H) 09/10/2018    Review of Glycemic Control  Post-prandials in 200s on 2/9. May benefit from addition of meal coverage insulin.   Inpatient Diabetes Program Recommendations:    Add Novolog 2 units tidwc for meal coverage insulin if pt eats > 50% meal.  Will follow closely.  Thank you. Lorenda Peck, RD, LDN, CDE Inpatient Diabetes Coordinator [phone]

## 2018-09-10 NOTE — Progress Notes (Signed)
Physical Therapy Treatment Patient Details Name: Blake Hines MRN: [id number] DOB: Dec 23, 1930 Today's Date: 09/10/2018    History of Present Illness Patient is 85 year old male with metastatic renal cell carcinoma currently undergoing palliative immunotherapy treatment.  Pt admitted with hyponatremia.    PT Comments    Pt is cooperative but limited this am by fatigue and increased SOB/WOB with exertion.   Follow Up Recommendations  Home health PT;Supervision for mobility/OOB     Equipment Recommendations  Rolling walker with 5" wheels    Recommendations for Other Services       Precautions / Restrictions Precautions Precautions: Fall Precaution Comments: monitor o2 sats and HR Restrictions Weight Bearing Restrictions: No    Mobility  Bed Mobility Overal bed mobility: Needs Assistance Bed Mobility: Supine to Sit     Supine to sit: Supervision     General bed mobility comments: HOB slightly elevated, MIN /guard for lines  Transfers Overall transfer level: Needs assistance Equipment used: Rolling walker (2 wheeled) Transfers: Sit to/from Stand Sit to Stand: Min guard         General transfer comment: verbal cues for hand placement  Ambulation/Gait Ambulation/Gait assistance: Min assist;Min guard Gait Distance (Feet): 58 Feet Assistive device: Rolling walker (2 wheeled) Gait Pattern/deviations: Step-through pattern;Decreased stride length Gait velocity: decr   General Gait Details: verbal cues for safe use of RW (pt does not typically use), SPO2 dropped to 86% on 4L O2 [place] and HR in 140s during ambulation, three standing rest breaks for task completion   Stairs             Wheelchair Mobility    Modified Rankin (Stroke Patients Only)       Balance Overall balance assessment: Needs assistance   Sitting balance-Leahy Scale: Good       Standing balance-Leahy Scale: Fair                              Cognition  Arousal/Alertness: Awake/alert Behavior During Therapy: WFL for tasks assessed/performed Overall Cognitive Status: Within Functional Limits for tasks assessed                                        Exercises      General Comments        Pertinent Vitals/Pain Pain Assessment: No/denies pain    Home Living                      Prior Function            PT Goals (current goals can now be found in the care plan section) Acute Rehab PT Goals Patient Stated Goal: home PT Goal Formulation: With patient Time For Goal Achievement: 09/19/18 Potential to Achieve Goals: Good Progress towards PT goals: Not progressing toward goals - comment(increased fatigue/SOB with exertion)    Frequency    Min 3X/week      PT Plan Current plan remains appropriate    Co-evaluation              AM-PAC PT "6 Clicks" Mobility   Outcome Measure  Help needed turning from your back to your side while in a flat bed without using bedrails?: None Help needed moving from lying on your back to sitting on the side of a flat bed without using bedrails?: None Help needed moving  to and from a bed to a chair (including a wheelchair)?: A Little Help needed standing up from a chair using your arms (e.g., wheelchair or bedside chair)?: A Little Help needed to walk in hospital room?: A Little Help needed climbing 3-5 steps with a railing? : A Lot 6 Click Score: 19    End of Session Equipment Utilized During Treatment: Gait belt Activity Tolerance: Patient limited by fatigue Patient left: in chair;with call bell/phone within reach;with family/visitor present Nurse Communication: Mobility status PT Visit Diagnosis: Difficulty in walking, not elsewhere classified (R26.2);Muscle weakness (generalized) (M62.81)     Time: 3958-4417 PT Time Calculation (min) (ACUTE ONLY): 19 min  Charges:  $Gait Training: 8-22 mins                     [place] Pager [phone] Office [phone]    Welborn Keena 09/10/2018, 12:51 PM

## 2018-09-10 NOTE — Progress Notes (Signed)
[facility]    NEPHROLOGY PROGRESS NOTE  SUBJECTIVE: Feeling well tonight.  Denies chest pain, shortness of breath, nausea, vomiting, diarrhea or dysuria.  Excellent urine output noted.  All other review of systems are negative.     OBJECTIVE:  Vitals:   09/10/18 1351 09/10/18 1417  BP: (!) 144/85   Pulse: (!) 125 (!) 122  Resp: 18   Temp: (!) 97.5 F (36.4 C) 98 F (36.7 C)  SpO2: 93%     Intake/Output Summary (Last 24 hours) at 09/10/2018 1617 Last data filed at 09/10/2018 1419 Gross per 24 hour  Intake 1386.72 ml  Output 1550 ml  Net -163.28 ml      [place]:  AAOx3 NAD HEENT: MMM [place] AT anicteric sclera Neck:  No JVD, no adenopathy CV:  Heart RRR  Lungs:  L/S CTA bilaterally Abd:  abd SNT/ND with normal BS GU:  Bladder non-palpable Extremities:  No LE edema. Skin:  No skin rash  MEDICATIONS:  . amLODipine  2.5 mg Oral Daily  . cholecalciferol  1,000 Units Oral Daily  . [START ON 09/11/2018] enoxaparin (LOVENOX) injection  1 mg/kg Subcutaneous Once  . [START ON 09/11/2018] enoxaparin (LOVENOX) injection  1 mg/kg Subcutaneous Q12H  . feeding supplement (ENSURE ENLIVE)  237 mL Oral BID BM  . furosemide  20 mg Oral BID  . insulin aspart  0-5 Units Subcutaneous QHS  . insulin aspart  0-9 Units Subcutaneous TID WC  . insulin glargine  5 Units Subcutaneous Daily  . latanoprost  1 drop Both Eyes QHS  . levofloxacin  750 mg Oral Q48H  . OLANZapine  2.5 mg Oral QHS  . sodium chloride  2 g Oral TID WC       LABS:   CBC Latest Ref Rng & Units 09/10/2018 09/09/2018 09/08/2018  WBC 4.0 - 10.5 K/uL 10.8(H) 11.6(H) 10.6(H)  Hemoglobin 13.0 - 17.0 g/dL 13.2 13.3 12.9(L)  Hematocrit 39.0 - 52.0 % 40.3 39.5 37.7(L)  Platelets 150 - 400 K/uL 420(H) 433(H) 335    CMP Latest Ref Rng & Units 09/10/2018 09/09/2018 09/09/2018  Glucose 70 - 99 mg/dL 197(H) 182(H) 171(H)  BUN 8 - 23 mg/dL 33(H) 30(H) 27(H)  Creatinine 0.61 - 1.24 mg/dL 1.47(H) 1.39(H) 1.31(H)  Sodium  135 - 145 mmol/L 124(L) 124(L) 120(L)  Potassium 3.5 - 5.1 mmol/L 4.4 4.5 4.4  Chloride 98 - 111 mmol/L 87(L) 88(L) 83(L)  CO2 22 - 32 mmol/L 28 24 26   Calcium 8.9 - 10.3 mg/dL 11.2(H) 10.9(H) 10.2  Total Protein 6.5 - 8.1 g/dL - - -  Total Bilirubin 0.3 - 1.2 mg/dL - - -  Alkaline Phos 38 - 126 U/L - - -  AST 15 - 41 U/L - - -  ALT 0 - 44 U/L - - -    Lab Results  Component Value Date   PTH NOT PERFORMED 09/09/2018   CALCIUM 11.2 (H) 09/10/2018       Component Value Date/Time   COLORURINE YELLOW 09/04/2018 1110   APPEARANCEUR CLEAR 09/04/2018 1110   LABSPEC 1.011 09/04/2018 1110   PHURINE 7.0 09/04/2018 1110   GLUCOSEU NEGATIVE 09/04/2018 1110   HGBUR NEGATIVE 09/04/2018 [place] 09/04/2018 1110   KETONESUR NEGATIVE 09/04/2018 1110   PROTEINUR NEGATIVE 09/04/2018 1110   NITRITE NEGATIVE 09/04/2018 1110   LEUKOCYTESUR NEGATIVE 09/04/2018 1110   No results found for: PHART, PCO2ART, PO2ART, HCO3, TCO2, ACIDBASEDEF, O2SAT  No results found for: IRON, TIBC, FERRITIN, IRONPCTSAT  ASSESSMENT/PLAN:      1.  Severe hyponatremia: euvolemic, possibly meds (HCTZ/ zoloft) vs SIADH due to malignancy.  Improving some on salt tablets and furosemide. Will change lasix to po 20 mg bid. Cont salt tabs and fluid restrcition. Follow up labs in am.  Hydrochlorothiazide is contraindicated for future use.   2.  Chronic kidney disease stage III.  Baseline serum creatinine appears to be in the 1.6 range based on prior labs.  His serum creatinine is better than his previously stated baseline.  Appears a bit fluid overloaded on examination.  Would use furosemide to manage his volume as well as the sodium.  Chronic kidney disease likely in the basis of longstanding diabetes and hypertension.  Hold off on ACE inhibition for now.  3.  Metastatic renal cancer.  4.  Rule out pulmonary embolism.  CT angiogram is pending.  5.  Hypercalcemia.  Likely secondary to underlying  metastatic renal cancer.  Will check PTH.  Continue vitamin D for now.    [facility] 09/10/2018, 4:19 PM

## 2018-09-10 NOTE — Care Management Important Message (Signed)
Important Message  Patient Details  Name: Blake Hines MRN: [id number] Date of Birth: 29-Aug-1930   Medicare Important Message Given:  Yes    Kerin Salen 09/10/2018, 11:09 AMImportant Message  Patient Details  Name: Blake Hines MRN: [id number] Date of Birth: 16-Oct-1930   Medicare Important Message Given:  Yes    Kerin Salen 09/10/2018, 11:09 AM

## 2018-09-10 NOTE — Progress Notes (Signed)
PROGRESS NOTE   Blake Hines  ENI:[id number]    DOB: August 29, 1930    DOA: 09/04/2018  PCP: Crist Infante, MD   I have briefly reviewed patients previous medical records in [facility].  Brief Narrative:  85 year old male with PMH of metastatic renal cell carcinoma, status post left nephrectomy, on immunotherapy, last cycle approximately 3 weeks ago, stage III chronic kidney disease, COPD, DM 2, HTN, bipolar disorder, presented with ongoing low-grade fever with chills and mild dyspnea at home, decreased appetite and progressive generalized weakness to an extent where he was unable to do his daily activities.  In ED sodium 124, creatinine 1.63.  Admitted for dehydration with hyponatremia and acute on chronic kidney disease.  Hydrated initially with IV saline, dehydration resolved but then his hyponatremia worsened.  Work-up suggested SIADH, fluid restricted, oral Lasix low-dose initiated, sodium improved to 125 but then again worsened to 120.  Patient has dyspnea, hypoxia, persistent sinus tachycardia on telemetry, reported 2 episodes of coughing up small blood clots prior to admission, relatively nonambulatory, underlying cancer diagnosis, concern for pulmonary embolism.  D-dimer positive, VQ scan intermediate study, lower extremity venous Dopplers negative for DVT, TTE without right heart strain.  CTA chest 2/8 confirmed acute PE, possible multifocal pneumonia, superimposed edema, increased mediastinal and hilar adenopathy, pulmonary metastasis.  IV heparin transitioned to full dose Lovenox 2/10.  PMT consulted for goals of care.   Assessment & Plan:   Active Problems:   Hyponatremia   AKI (acute kidney injury) ([place])   Type 2 diabetes mellitus without complication (HCC)   Bipolar disorder (HCC)   Abnormal liver function test   1. Dehydration with hyponatremia: Suspect due to poor oral intake related to recent immunotherapy, ongoing HCTZ use and also on Zoloft which could cause hyponatremia.   HCTZ and Zoloft discontinued.  Dehydration resolved. 2. Hyponatremia/suspected SIADH: At baseline patient has sodiums in the mid 20s.  Admission sodium 124, briefly improved with IV saline then dropped to 120.  HCTZ and Zoloft discontinued.  IV saline stopped.  Initial urine and serum osmolarity and urine sodium consistent with SIADH.  Low-dose Lasix started.  Nephrology consulted and added salt tablets.  Sodium improved from 120 > 124 and no mental status changes.  Continue Lasix 20 mg twice daily, salt tablets and fluid restriction.  Follow BMP in a.m.   3. Acute respiratory failure with hypoxia: Patient has dyspnea, hypoxia, persistent sinus tachycardia on telemetry, reported 2 episodes of coughing up small blood clots prior to admission, relatively nonambulatory, underlying cancer diagnosis, concern for pulmonary embolism.  D-dimer positive, VQ scan intermediate study, lower extremity venous Dopplers negative for DVT, TTE without right heart strain.  CTA chest 2/8 confirmed acute PE, possible multifocal pneumonia, superimposed edema, increased mediastinal and hilar adenopathy, pulmonary metastasis.  Treat underlying etiology and monitor closely.  Currently saturating in the low 90s on 2 L/min oxygen.  Will likely need home oxygen at discharge and will need to be assessed prior to discharge. 4. Acute pulmonary embolism: Treated initially with IV heparin for >48 hours.  I discussed with patient's primary oncologist Dr. Julien Nordmann on 2/10 who recommends preferably full dose Lovenox but if that is not possible then Xarelto.  I discussed in detail with patient and he too prefers Lovenox.  IV heparin changed to full dose Lovenox on 2/10. 5. Multifocal pneumonia: Seen on CTA chest.  Procalcitonin 0.1 > 0.15.  Continue levofloxacin, day 3 of 7. 6. Hypercalcemia, mild: Likely complicated by dehydration.  Uncorrected  serum calcium 11.2, PTH sample not appropriate, repeat PTH 2/10.  ? Consideration for  bisphosphonates. 7. Acute on stage III chronic kidney disease: Baseline creatinine may be in the 1.6 range.  Presented with creatinine of 1.91.  Creatinine has gone up slightly from 1.3-1.4.  Follow BMP in a.m.  Has good urine output/1550 yesterday. 8. DM2 with renal complications: Hold Januvia.  Continue NovoLog SSI.  CBGs uncontrolled in the low 200s.  Added Lantus 5 units subcutaneously daily. 9. Essential hypertension: HCTZ discontinued indefinitely.  Losartan held due to SIADH.  PRN IV hydralazine.  Started amlodipine 2.5 mg daily.  Better. 10. COPD: Stable without clinical bronchospasm. 11. Hyperlipidemia: Continue TriCor. 12. Bipolar disorder: Stable.  Continue Zyprexa 2.5 mg at bedtime. 13. Abnormal LFTs: No GI symptoms other than decreased appetite.  Unclear etiology but seem to be improving.?  Related to cancer treatment.  Outpatient follow-up. 14. Metastatic renal cell carcinoma: Outpatient follow-up with Dr. Julien Nordmann.  CTA chest shows pulmonary nodules, increasing mediastinal and hilar adenopathy.  Discussed with Dr. Julien Nordmann, oncology on 2/10 and he recommended getting palliative care consultation for goals of care-ordered. 15. ST: Likely related to acute lung issues noted above.  Asymptomatic.  Please see discussion above.  TSH >10.  Free T3 and free T4: WNL.?  Sick euthyroid.  Consider repeating TSH in 4 to 6 weeks.  Improved and stable.   DVT prophylaxis: IV heparin changed to full dose Lovenox 2/10. Code Status: Full, confirmed again with patient today. Family Communication: None at bedside.  I have discussed with patient's daughter couple of times while she was in [place].  She is flying in from [place] today and is expected to reach [employer], Disposition: To be determined pending clinical improvement, hopefully home in the next 24 to 48 hours.   Consultants:  Pulmonology Nephrology  Procedures:  None  Antimicrobials:  Levofloxacin  Subjective: No dyspnea at  rest.  Dyspnea with minimal activity.  Cough with intermittent yellow sputum but no blood.  No chest pain.  No palpitations, dizziness or lightheadedness.  ROS: As above.  Objective:  Vitals:   09/09/18 1548 09/09/18 1605 09/09/18 2007 09/10/18 0521  BP: (!) 154/93  (!) 148/79 (!) 145/99  Pulse: (!) 112 (!) 112 (!) 105 (!) 107  Resp: (!) 22 20 20 20   Temp: 97.6 F (36.4 C)  98.1 F (36.7 C) 97.9 F (36.6 C)  TempSrc: Oral  Oral Oral  SpO2: 93%  92% 93%  Weight:      Height:        Examination:  General exam: Pleasant elderly male, moderately built and nourished, lying comfortably propped up in bed. Respiratory system: Improving breath sounds.  Diminished in the bases with few basal crackles but otherwise clear to auscultation.  No increased work of breathing. Cardiovascular system: S1 & S2 heard, RRR. No JVD, murmurs, rubs, gallops or clicks. No pedal edema.  Telemetry personally reviewed: Sinus tachycardia in the 110s with BBB morphology, better compared to 48 hours ago. Gastrointestinal system: Abdomen is nondistended, soft and nontender. No organomegaly or masses felt. Normal bowel sounds heard.  Stable. Central nervous system: Alert and oriented person and place and partly to time. No focal neurological deficits.  Stable. Extremities: Symmetric 5 x 5 power. Skin: No rashes, lesions or ulcers Psychiatry: Judgement and insight appear impaired. Mood & affect appropriate.     Data Reviewed: I have personally reviewed following labs and imaging studies  CBC: Recent Labs  Lab 09/04/18 1015 09/05/18 0640  09/08/18 0959 09/09/18 0624 09/10/18 0626  WBC 10.8* 9.4 10.6* 11.6* 10.8*  NEUTROABS 8.5*  --   --   --   --   HGB 13.2 12.0* 12.9* 13.3 13.2  HCT 39.6 36.2* 37.7* 39.5 40.3  MCV 83.0 85.6 83.6 82.1 84.1  PLT 364 317 335 433* 193*   Basic Metabolic Panel: Recent Labs  Lab 09/07/18 0609 09/08/18 0019 09/09/18 0624 09/09/18 1858 09/10/18 0626  NA 125* 120* 120*  124* 124*  K 4.3 4.2 4.4 4.5 4.4  CL 90* 84* 83* 88* 87*  CO2 27 27 26 24 28   GLUCOSE 166* 177* 171* 182* 197*  BUN 29* 29* 27* 30* 33*  CREATININE 1.43* 1.35* 1.31* 1.39* 1.47*  CALCIUM 10.7* 10.5* 10.2  REJ5 10.9* 11.2*   Liver Function Tests: Recent Labs  Lab 09/04/18 1015 09/05/18 0640 09/07/18 0609  AST 70* 56* 55*  ALT 200* 148* 136*  ALKPHOS 59 49 47  BILITOT 0.9 0.7 0.8  PROT 6.9 5.8* 5.9*  ALBUMIN 3.1* 2.6* 2.6*   CBG: Recent Labs  Lab 09/09/18 1127 09/09/18 1704 09/09/18 2011 09/10/18 0719 09/10/18 1128  GLUCAP 225* 239* 228* 213* 264*    Recent Results (from the past 240 hour(s))  Blood culture (routine x 2)     Status: None   Collection Time: 09/04/18 10:39 AM  Result Value Ref Range Status   Specimen Description   Final    BLOOD BLOOD LEFT FOREARM Performed at [facility] [address], [place], [place] [address]    Special Requests   Final    BOTTLES DRAWN AEROBIC AND ANAEROBIC Blood Culture results may not be optimal due to an excessive volume of blood received in culture bottles Performed at [facility] [address], [place], [place] [address]    Culture   Final    NO GROWTH 5 DAYS Performed at [facility], [place] [address] [place], [place] [address]    Report Status 09/09/2018 FINAL  Final  Urine culture     Status: None   Collection Time: 09/04/18 11:10 AM  Result Value Ref Range Status   Specimen Description   Final    URINE, RANDOM Performed at [facility] [address] [place], [place] [address]    Special Requests   Final    NONE Performed at [facility], [place] [address] [place], [place] [address]    Culture   Final    NO GROWTH Performed at [facility], [place] [address], [place], [place] [address]    Report Status 09/05/2018 FINAL  Final  Blood culture (routine x 2)     Status: None   Collection Time: 09/04/18 11:53 AM  Result  Value Ref Range Status   Specimen Description   Final    BLOOD LEFT ANTECUBITAL Performed at [facility] [address] [place], [place] [address]    Special Requests   Final    BOTTLES DRAWN AEROBIC AND ANAEROBIC Blood Culture adequate volume Performed at [facility] [address], [place], [place] [address]    Culture   Final    NO GROWTH 5 DAYS Performed at [facility], [place] [address], [place],  [address]    Report Status 09/09/2018 FINAL  Final  Culture, blood (Routine X 2) w Reflex to ID Panel     Status: None (Preliminary result)   Collection Time: 09/08/18  9:59 AM  Result Value Ref Range Status   Specimen Description   Final    BLOOD LEFT ANTECUBITAL Performed at [facility] [address] [place], [place] [address]    Special Requests   Final    BOTTLES DRAWN AEROBIC ONLY Blood Culture adequate volume Performed at [facility] [address] [place], [place] [address]    Culture   Final    NO GROWTH < 24 HOURS Performed at [facility] [address], [place], [place] [address]    Report Status PENDING  Incomplete  Culture, blood (Routine X 2) w Reflex to ID Panel     Status: None (Preliminary result)   Collection Time: 09/08/18 10:06 AM  Result Value Ref Range Status   Specimen Description   Final    BLOOD LEFT ARM Performed at [facility] [address], [place], [place] [address]    Special Requests   Final    BOTTLES DRAWN AEROBIC AND ANAEROBIC Blood Culture adequate volume Performed at [facility] [address] [place], [place] [address]    Culture   Final    NO GROWTH < 24 HOURS Performed at [facility] [address] [place], [place] [address]    Report Status PENDING  Incomplete         Radiology Studies: Ct Angio Chest Pe W Or Wo Contrast  Result Date: 09/08/2018 CLINICAL DATA:  History  of metastatic renal cell carcinoma. EXAM: CT ANGIOGRAPHY CHEST WITH CONTRAST TECHNIQUE: Multidetector CT imaging of the chest was performed using the standard protocol during bolus administration of intravenous contrast. Multiplanar CT image reconstructions and MIPs were obtained to evaluate the vascular anatomy. CONTRAST:  78mL ISOVUE-370 IOPAMIDOL (ISOVUE-370) INJECTION 76% COMPARISON:  PET-CT 08/10/2018 FINDINGS: Cardiovascular: Normal heart size. Small pericardial effusion. Aorta and main pulmonary artery normal in caliber. Thoracic aortic vascular calcifications. Nonocclusive embolus demonstrated within the distal right lower lobe pulmonary artery (image 164; series 5). Additionally embolus is demonstrated within the right middle lobe segmental and subsegmental pulmonary arteries. Small amount of embolic material demonstrated within the right upper lobe pulmonary arteries. RV/LV ratio less than 1. Mediastinum/Nodes: Re demonstrated extensive mediastinal and hilar adenopathy. Slight interval increase in size of subcarinal lymph node measuring 4.7 cm (image 53; series 4), previously 4.2 cm. Similar-appearing 2.5 cm left inferior hilar lymph node (image 65; series 4), previously 2.5 cm. Interval increase in size of left hilar lymph node measuring 1.5 cm (image 53; series 4), previously 1.0 cm. Lungs/Pleura: Similar-appearing scattered pulmonary nodules. Interval development of masslike area of consolidation within the left lower lobe measuring 6.1 x 3.9 cm. Additionally there is new predominately subpleural ground-glass and consolidative opacities throughout the left lung and right lower lobe. Upper Abdomen: No acute process. Musculoskeletal: Thoracic spine degenerative changes. No aggressive or acute appearing osseous lesions identified. Review of the MIP images confirms the above findings. IMPRESSION: 1. Findings positive for pulmonary embolus predominately within the right middle and right lower lobes as well  as right upper lobe. 2. Interval development of a large (6 cm) masslike area of consolidation within the left lower lobe. Additionally there is new patchy predominately subpleural ground-glass and consolidative opacities throughout the left lung and right lower lobe. Overall findings are concerning for multifocal infectious process. Possibility of superimposed edema not excluded. 3. Interval increase in size of mediastinal and hilar adenopathy. 4. Re demonstrated scattered pulmonary nodules compatible with known metastatic disease.  5. Critical Value/emergent results were called by telephone at the time of interpretation on 09/08/2018 at 6:54 pm to Dr. Vernell Leep , who verbally acknowledged these results. Electronically Signed   By: Lovey Newcomer M.D.   On: 09/08/2018 18:56        Scheduled Meds: . amLODipine  2.5 mg Oral Daily  . cholecalciferol  1,000 Units Oral Daily  . feeding supplement (ENSURE ENLIVE)  237 mL Oral BID BM  . furosemide  20 mg Intravenous Q12H  . insulin aspart  0-5 Units Subcutaneous QHS  . insulin aspart  0-9 Units Subcutaneous TID WC  . latanoprost  1 drop Both Eyes QHS  . levofloxacin  750 mg Oral Q48H  . OLANZapine  2.5 mg Oral QHS  . sodium chloride  2 g Oral TID WC   Continuous Infusions: . heparin 1,950 Units/hr (09/10/18 1019)     LOS: 6 days     Vernell Leep, MD, FACP, [facility]. [facility]  To contact the attending provider between 7A-7P or the covering provider during after hours 7P-7A, please log into the web site [email or web address] and access using universal [place] password for that web site. If you do not have the password, please call the hospital operator.  09/10/2018, 12:59 PM

## 2018-09-10 NOTE — Progress Notes (Addendum)
ANTICOAGULATION CONSULT NOTE - Follow Up Consult  Pharmacy Consult for IV heparin - transition to full dose Lovenox on 2/10 Indication: pulmonary embolus  No Known Allergies  Patient Measurements: Height: _0  (172.7 cm) Weight: 177 lb 7.5 oz (80.5 kg) IBW/kg (Calculated) : 68.4 Heparin Dosing Weight: actual  Vital Signs: Temp: 97.9 F (36.6 C) (02/10 0521) Temp Source: Oral (02/10 0521) BP: 145/99 (02/10 0521) Pulse Rate: 107 (02/10 0521)  Labs: Recent Labs    09/08/18 0019  09/08/18 0959  09/09/18 0624 09/09/18 1649 09/09/18 1858 09/10/18 0626  HGB  --    < > 12.9*  --  13.3  --   --  13.2  HCT  --   --  37.7*  --  39.5  --   --  40.3  PLT  --   --  335  --  433*  --   --  420*  APTT 45*  --   --   --   --   --   --   --   LABPROT 15.0  --   --   --   --   --   --   --   INR 1.19  --   --   --   --   --   --   --   HEPARINUNFRC  --   --  0.22*   < > 0.28* 0.45  --  0.45  CREATININE 1.35*  --   --   --  1.31*  --  1.39* 1.47*   < > = values in this interval not displayed.    Estimated Creatinine Clearance: 35.5 mL/min (A) (by C-G formula based on SCr of 1.47 mg/dL (H)).   Medical History: Past Medical History:  Diagnosis Date  . Bipolar 1 disorder ([place])   . BPH (benign prostatic hyperplasia)   . Cancer ([place])    RCC  . Cataract   . Chronic kidney disease   . COPD (chronic obstructive pulmonary disease) ([place])   . Depression   . Diabetes mellitus without complication ([place])   . Hypertension     Medications:  Scheduled:  . amLODipine  2.5 mg Oral Daily  . cholecalciferol  1,000 Units Oral Daily  . feeding supplement (ENSURE ENLIVE)  237 mL Oral BID BM  . furosemide  20 mg Intravenous Q12H  . insulin aspart  0-5 Units Subcutaneous QHS  . insulin aspart  0-9 Units Subcutaneous TID WC  . latanoprost  1 drop Both Eyes QHS  . levofloxacin  750 mg Oral Q48H  . OLANZapine  2.5 mg Oral QHS  . sodium chloride  2 g Oral TID WC   Infusions:  . heparin 1,950  Units/hr (09/10/18 0300)    Assessment: 60 yoM admitted 2/4 with generalized weakness, hx of metastatic renal cell carcinoma now with elevated d-dimer and intermediate probability for PE per perfusion lung scan. Dopplers to rule out DVT. CTa ordered.  Today, 09/10/2018  Heparin level therapeutic now x 2 on current IV heparin rate of 1950 units/hr  CBC stable  SCr: 1.47 up  No bleeding or complications per patient  Confirmed rate per IV pump rate   Goal of Therapy:  Heparin level 0.3-0.7 units/ml Monitor platelets by anticoagulation protocol: Yes   Plan:   Continue heparin drip at current rate of 1950 units/hr  Daily CBC and heparin level  Await plan as to what NOAC patient will be transitioned to   Adrian Saran, PharmD,  BCPS Pager [phone] 09/10/2018 9:46 AM    ---------------------------------------------------------------------------------------------------------------------------- Addendum:  A: to discontinue IV heparin and start full dose Lovenox per recommendations of heme/onc.   Plan: 1) Discontinue IV heparin now 2) 1 hour after d/c'ing IV heparin, start Lovenox 63m/kg SQ q12 3) Lovenox teaching kit  JAdrian Saran PharmD, BCPS Pager [phone]/05/2019 1:02 PM

## 2018-09-11 ENCOUNTER — Encounter (HOSPITAL_COMMUNITY): Payer: Self-pay | Admitting: Nephrology

## 2018-09-11 DIAGNOSIS — Z7189 Other specified counseling: Secondary | ICD-10-CM

## 2018-09-11 DIAGNOSIS — C649 Malignant neoplasm of unspecified kidney, except renal pelvis: Secondary | ICD-10-CM

## 2018-09-11 DIAGNOSIS — R0602 Shortness of breath: Secondary | ICD-10-CM

## 2018-09-11 DIAGNOSIS — E119 Type 2 diabetes mellitus without complications: Secondary | ICD-10-CM

## 2018-09-11 DIAGNOSIS — Z515 Encounter for palliative care: Secondary | ICD-10-CM

## 2018-09-11 LAB — CBC
HCT: 41 % (ref 39.0–52.0)
Hemoglobin: 13.4 g/dL (ref 13.0–17.0)
MCH: 28.2 pg (ref 26.0–34.0)
MCHC: 32.7 g/dL (ref 30.0–36.0)
MCV: 86.1 fL (ref 80.0–100.0)
Platelets: 506 10*3/uL — ABNORMAL HIGH (ref 150–400)
RBC: 4.76 MIL/uL (ref 4.22–5.81)
RDW: 12.6 % (ref 11.5–15.5)
WBC: 12.2 10*3/uL — ABNORMAL HIGH (ref 4.0–10.5)
nRBC: 0 % (ref 0.0–0.2)

## 2018-09-11 LAB — COMPREHENSIVE METABOLIC PANEL
ALT: 117 U/L — ABNORMAL HIGH (ref 0–44)
AST: 46 U/L — ABNORMAL HIGH (ref 15–41)
Albumin: 2.8 g/dL — ABNORMAL LOW (ref 3.5–5.0)
Alkaline Phosphatase: 48 U/L (ref 38–126)
Anion gap: 11 (ref 5–15)
BUN: 40 mg/dL — ABNORMAL HIGH (ref 8–23)
CO2: 27 mmol/L (ref 22–32)
Calcium: 11.7 mg/dL — ABNORMAL HIGH (ref 8.9–10.3)
Chloride: 87 mmol/L — ABNORMAL LOW (ref 98–111)
Creatinine, Ser: 1.68 mg/dL — ABNORMAL HIGH (ref 0.61–1.24)
GFR calc Af Amer: 42 mL/min — ABNORMAL LOW (ref >60)
GFR, EST NON AFRICAN AMERICAN: 36 mL/min — AB (ref >60)
Glucose, Bld: 184 mg/dL — ABNORMAL HIGH (ref 70–99)
Potassium: 4.5 mmol/L (ref 3.5–5.1)
Sodium: 125 mmol/L — ABNORMAL LOW (ref 135–145)
Total Bilirubin: 0.8 mg/dL (ref 0.3–1.2)
Total Protein: 6.2 g/dL — ABNORMAL LOW (ref 6.5–8.1)

## 2018-09-11 LAB — GLUCOSE, CAPILLARY
Glucose-Capillary: 174 mg/dL — ABNORMAL HIGH (ref 70–99)
Glucose-Capillary: 193 mg/dL — ABNORMAL HIGH (ref 70–99)
Glucose-Capillary: 218 mg/dL — ABNORMAL HIGH (ref 70–99)
Glucose-Capillary: 250 mg/dL — ABNORMAL HIGH (ref 70–99)

## 2018-09-11 LAB — PTH, INTACT AND CALCIUM
Calcium, Total (PTH): 12.4 mg/dL — ABNORMAL HIGH (ref 8.6–10.2)
PTH: 6 pg/mL — ABNORMAL LOW (ref 15–65)

## 2018-09-11 MED ORDER — INSULIN ASPART 100 UNIT/ML ~~LOC~~ SOLN
2.0000 [IU] | Freq: Three times a day (TID) | SUBCUTANEOUS | Status: DC
  Administered 2018-09-11 (×2): 2 [IU] via SUBCUTANEOUS

## 2018-09-11 MED ORDER — AMLODIPINE BESYLATE 5 MG PO TABS
5.0000 mg | ORAL_TABLET | Freq: Every day | ORAL | Status: DC
  Administered 2018-09-12 – 2018-09-15 (×4): 5 mg via ORAL
  Filled 2018-09-11 (×4): qty 1

## 2018-09-11 MED ORDER — DEMECLOCYCLINE HCL 150 MG PO TABS
150.0000 mg | ORAL_TABLET | Freq: Two times a day (BID) | ORAL | Status: DC
  Administered 2018-09-11 – 2018-09-15 (×8): 150 mg via ORAL
  Filled 2018-09-11 (×9): qty 1

## 2018-09-11 NOTE — Progress Notes (Signed)
Inpatient Diabetes Program Recommendations  AACE/ADA: New Consensus Statement on Inpatient Glycemic Control (2015)  Target Ranges:  Prepandial:   less than 140 mg/dL      Peak postprandial:   less than 180 mg/dL (1-2 hours)      Critically ill patients:  140 - 180 mg/dL   Lab Results  Component Value Date   GLUCAP 193 (H) 09/11/2018    Review of Glycemic Control  Diabetes history: DM2 Outpatient Diabetes medications: Toujeo 6 units daily, Humalog 4 units with dinner, Januvia Current orders for Inpatient glycemic control: Lantus 5 units + Novolog sensitive correction tid + hs  Inpatient Diabetes Program Recommendations:   Noted postprandial CBGs elevated. Add Novolog 2 units tidwc for meal coverage insulin if pt eats > 50% meal.  Thank you, Nani Gasser. Kacey Vicuna, RN, MSN, CDE  Diabetes Coordinator Inpatient Glycemic Control Team Team Pager [phone] (8am-5pm) 09/11/2018 11:03 AM

## 2018-09-11 NOTE — Consult Note (Signed)
Consultation Note Date: 09/12/2018   Patient Name: Blake Hines  DOB: 1931/01/08  MRN: [id number]  Age / Sex: 85 y.o., male  PCP: Crist Infante, MD Referring Physician: Mendel Corning, MD  Reason for Consultation: Establishing goals of care  HPI/Patient Profile: 85 y.o. male  with past medical history of renal cell carcinoma on immunotherapy, stage III CKD, COPD, DM2, HTN, bipolar disorder admitted on 09/04/2018 with hyponatremia, multifocal PNA, pulmonary embolis, acute on chronic kidney disease, and CT consistent with progression of mediastinal and hilar adenopathy.  Palliative consulted for [place].  Clinical Assessment and Goals of Care: I met today with Mr. Lanum, his daughter, and his granddaugter.   I introduced palliative care as specialized medical care for people living with serious illness. It focuses on providing relief from the symptoms and stress of a serious illness. The goal is to improve quality of life for both the patient and the family.  We discussed clinical course as well as wishes moving forward in regard to advanced directives.  Concepts specific to code status and rehospitalization discussed.  We discussed difference between a aggressive medical intervention path and a palliative, comfort focused care path.  Values and goals of care important to patient and family were attempted to be elicited.  Questions and concerns addressed.   PMT will continue to support holistically.  SUMMARY OF RECOMMENDATIONS   - Full code/full scope treatment.  We had a long discussion regarding his multiple medical problems, progression of cancer, and care plan moving forward.  He reports understanding concerns, but he needs to discuss further with Dr. Julien Nordmann and his PCP, whom he trusts greatly, Dr. Joylene Draft.  He states that he is not going to make any changes to care plan without Dr. Silvestre Mesi input. - We reviewed  a MOST form and discussed how to develop plan of care to focus on continuing therapies that would maximize chance of being well enough to return home and limiting therapies not in line with this goal.  He will take it, review form, and discuss with Dr. Joylene Draft during follow-up visit. - I discussed with his daughter outside the room, and she reports understanding that his disease continues to progress, but she reports that he has had trouble accepting this. - I provided his daughter with a copy of Hard Choices for Loving People to review. - He reports completing documentation naming his daughter as HCPOA.  I advised they bring a copy to be placed in his chart. - He is interesting in having palliative care follow as an outpatient.  Code Status/Advance Care Planning:  Full code  Additional Recommendations (Limitations, Scope, Preferences):  Full Scope Treatment  Psycho-social/Spiritual:   Desire for further Chaplaincy support: No  Additional Recommendations: Education on Hospice  Prognosis:   Unable to determine  Discharge Planning: Home with Home Health      Primary Diagnoses: Present on Admission: . Hyponatremia   I have reviewed the medical record, interviewed the patient and family, and examined the patient. The following aspects are  pertinent.  Past Medical History:  Diagnosis Date  . Bipolar 1 disorder ([place])   . BPH (benign prostatic hyperplasia)   . Cancer ([place])    RCC  . Cataract   . Chronic kidney disease   . COPD (chronic obstructive pulmonary disease) ([place])   . Depression   . Diabetes mellitus without complication ([place])   . Hypertension    Social History   Socioeconomic History  . Marital status: Widowed    Spouse name: Not on file  . Number of children: Not on file  . Years of education: Not on file  . Highest education level: Not on file  Occupational History  . Not on file  Social Needs  . Financial resource strain: Not on file  . Food insecurity:     Worry: Not on file    Inability: Not on file  . Transportation needs:    Medical: Not on file    Non-medical: Not on file  Tobacco Use  . Smoking status: Former Smoker    Packs/day: 0.50    Years: 40.00    Pack years: 20.00    Types: Cigarettes    Last attempt to quit: 07/13/2003    Years since quitting: 15.1  . Smokeless tobacco: Never Used  Substance and Sexual Activity  . Alcohol use: Yes    Comment: 50 cc wild [place] daily  . Drug use: Not on file  . Sexual activity: Not on file  Lifestyle  . Physical activity:    Days per week: Not on file    Minutes per session: Not on file  . Stress: Not on file  Relationships  . Social connections:    Talks on phone: Not on file    Gets together: Not on file    Attends religious service: Not on file    Active member of club or organization: Not on file    Attends meetings of clubs or organizations: Not on file    Relationship status: Not on file  Other Topics Concern  . Not on file  Social History Narrative  . Not on file   History reviewed. No pertinent family history. Scheduled Meds: . amLODipine  5 mg Oral Daily  . cholecalciferol  1,000 Units Oral Daily  . demeclocycline  150 mg Oral Q12H  . enoxaparin (LOVENOX) injection  1 mg/kg Subcutaneous Q24H  . feeding supplement (ENSURE ENLIVE)  237 mL Oral BID BM  . insulin aspart  0-5 Units Subcutaneous QHS  . insulin aspart  0-9 Units Subcutaneous TID WC  . insulin aspart  2 Units Subcutaneous TID WC  . insulin glargine  5 Units Subcutaneous Daily  . latanoprost  1 drop Both Eyes QHS  . levofloxacin  750 mg Oral Q48H  . OLANZapine  2.5 mg Oral QHS  . sodium chloride  2 g Oral TID WC   Continuous Infusions: PRN Meds:.acetaminophen, hydrALAZINE Medications Prior to Admission:  Prior to Admission medications   Medication Sig Start Date End Date Taking? Authorizing Provider  ANORO ELLIPTA 62.5-25 MCG/INH AEPB Inhale 1 puff into the lungs daily.  06/19/18  Yes [provider]  cholecalciferol (VITAMIN D3) 25 MCG (1000 UT) tablet Take 1,000 Units by mouth daily.   Yes [provider]  Coenzyme Q10 (COQ10) 200 MG CAPS Take 200 mg by mouth daily.    Yes [provider]  fenofibrate (TRICOR) 48 MG tablet Take 48 mg by mouth daily.   Yes [provider]  hydrochlorothiazide (HYDRODIURIL)  25 MG tablet Take 25 mg by mouth daily.   Yes [provider]  insulin lispro (HUMALOG KWIKPEN) 100 UNIT/ML KwikPen Inject 4 Units into the skin every evening. With dinner    Yes [provider]  latanoprost (XALATAN) 0.005 % ophthalmic solution Place 1 drop into both eyes at bedtime.   Yes [provider]  losartan (COZAAR) 100 MG tablet Take 100 mg by mouth daily.   Yes [provider]  Multiple Vitamins-Minerals (MULTIVITAMIN WITH MINERALS) tablet Take 1 tablet by mouth daily.   Yes [provider]  OLANZapine (ZYPREXA) 2.5 MG tablet Take 2.5 mg by mouth at bedtime.   Yes [provider]  sertraline (ZOLOFT) 50 MG tablet Take 50 mg by mouth daily.   Yes [provider]  sitaGLIPtin (JANUVIA) 50 MG tablet Take 50 mg by mouth every morning.   Yes [provider]  TOUJEO SOLOSTAR 300 UNIT/ML SOPN Inject 6 Units into the skin daily.  08/19/18  Yes [provider]  FREESTYLE LITE test strip USE TO TEST BLOOD SUGAR TWICE DAILY (E11.29) 07/03/18   [provider]   No Known Allergies Review of Systems  Constitutional: Positive for activity change, appetite change and fatigue.  Musculoskeletal: Positive for back pain.  Psychiatric/Behavioral: Positive for sleep disturbance.    Physical Exam General: Alert, awake, in no acute distress.  HEENT: No bruits, no goiter, no JVD Heart: Regular rate and rhythm. No murmur appreciated. Lungs: Good air movement, clear Abdomen: Soft, nontender, nondistended, positive bowel sounds.  Ext: No significant edema Skin: Warm  and dry Neuro: Grossly intact, nonfocal.   Vital Signs: BP (!) 137/91 (BP Location: Left Arm)   Pulse (!) 118   Temp 97.6 F (36.4 C) (Oral)   Resp 18   Ht 5' 8"  (1.727 m)   Wt 75.3 kg   SpO2 92%   BMI 25.24 kg/m  Pain Scale: 0-10   Pain Score: 0-No pain   SpO2: SpO2: 92 % O2 Device:SpO2: 92 % O2 Flow Rate: .O2 Flow Rate (L/min): 1.5 L/min  IO: Intake/output summary:   Intake/Output Summary (Last 24 hours) at 09/12/2018 4158 Last data filed at 09/12/2018 3094 Gross per 24 hour  Intake 576 ml  Output 1675 ml  Net -1099 ml    LBM: Last BM Date: 09/10/18 Baseline Weight: Weight: 80.5 kg Most recent weight: Weight: 75.3 kg     Palliative Assessment/Data:   Flowsheet Rows     Most Recent Value  Intake Tab  Referral Department  Hospitalist  Unit at Time of Referral  Med/Surg Unit  Palliative Care Primary Diagnosis  Cancer  Date Notified  09/10/18  Palliative Care Type  New Palliative care  Reason for referral  Clarify Goals of Care  Date of Admission  09/04/18  Date first seen by Palliative Care  09/11/18  # of days Palliative referral response time  1 Day(s)  # of days IP prior to Palliative referral  6  Clinical Assessment  Psychosocial & Spiritual Assessment  Palliative Care Outcomes      Time In: 1700 Time Out: 1820 Time Total: 80 Greater than 50%  of this time was spent counseling and coordinating care related to the above assessment and plan.  Signed by: Micheline Rough, MD   Please contact Palliative Medicine Team phone at [phone] for questions and concerns.  For individual provider: See Shea Evans

## 2018-09-11 NOTE — Progress Notes (Signed)
Nutrition Follow-up  INTERVENTION:   Continue Ensure Enlive po BID, each supplement provides 350 kcal and 20 grams of protein  NUTRITION DIAGNOSIS:   Increased nutrient needs related to cancer and cancer related treatments as evidenced by estimated needs.  Ongoing.  GOAL:   Patient will meet greater than or equal to 90% of their needs  Progressing.  MONITOR:   PO intake, Supplement acceptance, Labs, Weight trends, I & O's  ASSESSMENT:   85 y.o. male with medical history significant of metastatic renal cell carcinoma status post left nephrectomy with numerous bilateral lung nodules and large mediastinal lymphadenopathy and metastatic left adrenal mass on treatment with immunotherapy.  Admitted with hyponatremia.  Patient is currently consuming 50-100% of meals over the past 24 hours. Pt is drinking Ensure supplements with no issues.  No new weight has been measured for this admission (last recorded 2/4).   Medications: Vitamin D tablet daily, Lasix tablet BID  Labs reviewed: CBGs: 193-218 Low Na GFR: 36  Diet Order:   Diet Order            Diet heart healthy/carb modified Room service appropriate? Yes; Fluid consistency: Thin; Fluid restriction: Other (see comments)  Diet effective now              EDUCATION NEEDS:   Education needs have been addressed  Skin:  Skin Assessment: Reviewed RN Assessment  Last BM:  2/10  Height:   Ht Readings from Last 1 Encounters:  09/04/18 5\' 8"  (1.727 m)    Weight:   Wt Readings from Last 1 Encounters:  09/04/18 80.5 kg    Ideal Body Weight:  70 kg  BMI:  Body mass index is 26.98 kg/m.  Estimated Nutritional Needs:   Kcal:  2200-2400  Protein:  90-100g  Fluid:  2.2L/day  Clayton Bibles, MS, RD, LDN [place] [facility] Pager: [phone] After Hours Pager: [phone]

## 2018-09-11 NOTE — Progress Notes (Signed)
[facility]    NEPHROLOGY PROGRESS NOTE  SUBJECTIVE: Feeling ok, walked w PT 60 feet yest w/ rolling walker.  [place]  Pt is tired.      OBJECTIVE:  Vitals:   09/11/18 0603 09/11/18 1328  BP: (!) 159/93 139/87  Pulse:  (!) 119  Resp: 20 17  Temp: 98 F (36.7 C)   SpO2: 94% 93%    Intake/Output Summary (Last 24 hours) at 09/11/2018 1619 Last data filed at 09/11/2018 1300 Gross per 24 hour  Intake 360 ml  Output 825 ml  Net -465 ml      Gen[place]l:  AAOx3 NAD HEENT: MMM [place] AT anicteric sclera Neck:  No JVD, no adenopathy CV:  Heart RRR  Lungs:  L/S CTA bilaterally Abd:  abd SNT/ND with normal BS GU:  Bladder non-palpable Extremities:  No LE edema. Skin:  No skin rash  MEDICATIONS:  . [START ON 09/12/2018] amLODipine  5 mg Oral Daily  . cholecalciferol  1,000 Units Oral Daily  . demeclocycline  150 mg Oral Q12H  . enoxaparin (LOVENOX) injection  1 mg/kg Subcutaneous Q12H  . feeding supplement (ENSURE ENLIVE)  237 mL Oral BID BM  . insulin aspart  0-5 Units Subcutaneous QHS  . insulin aspart  0-9 Units Subcutaneous TID WC  . insulin aspart  2 Units Subcutaneous TID WC  . insulin glargine  5 Units Subcutaneous Daily  . latanoprost  1 drop Both Eyes QHS  . levofloxacin  750 mg Oral Q48H  . OLANZapine  2.5 mg Oral QHS  . sodium chloride  2 g Oral TID WC       LABS:   CBC Latest Ref Rng & Units 09/11/2018 09/10/2018 09/09/2018  WBC 4.0 - 10.5 K/uL 12.2(H) 10.8(H) 11.6(H)  Hemoglobin 13.0 - 17.0 g/dL 13.4 13.2 13.3  Hematocrit 39.0 - 52.0 % 41.0 40.3 39.5  Platelets 150 - 400 K/uL 506(H) 420(H) 433(H)    CMP Latest Ref Rng & Units 09/11/2018 09/10/2018 09/10/2018  Glucose 70 - 99 mg/dL 184(H) 197(H) -  BUN 8 - 23 mg/dL 40(H) 33(H) -  Creatinine 0.61 - 1.24 mg/dL 1.68(H) 1.47(H) -  Sodium 135 - 145 mmol/L 125(L) 124(L) -  Potassium 3.5 - 5.1 mmol/L 4.5 4.4 -  Chloride 98 - 111 mmol/L 87(L) 87(L) -  CO2 22 - 32 mmol/L 27 28 -   Calcium 8.9 - 10.3 mg/dL 11.7(H) 12.4(H) 11.2(H)  Total Protein 6.5 - 8.1 g/dL 6.2(L) - -  Total Bilirubin 0.3 - 1.2 mg/dL 0.8 - -  Alkaline Phos 38 - 126 U/L 48 - -  AST 15 - 41 U/L 46(H) - -  ALT 0 - 44 U/L 117(H) - -    Lab Results  Component Value Date   PTH 6 (L) 09/10/2018   PTH Comment 09/10/2018   CALCIUM 11.7 (H) 09/11/2018       Component Value Date/Time   COLORURINE YELLOW 09/04/2018 1110   APPEARANCEUR CLEAR 09/04/2018 1110   LABSPEC 1.011 09/04/2018 1110   PHURINE 7.0 09/04/2018 1110   GLUCOSEU NEGATIVE 09/04/2018 1110   HGBUR NEGATIVE 09/04/2018 1110   BILIRUBINUR NEGATIVE 09/04/2018 1110   [place] 09/04/2018 1110   PROTEINUR NEGATIVE 09/04/2018 1110   NITRITE NEGATIVE 09/04/2018 1110   LEUKOCYTESUR NEGATIVE 09/04/2018 1110   No results found for: PHART, PCO2ART, PO2ART, HCO3, TCO2, ACIDBASEDEF, O2SAT  No results found for: IRON, TIBC, FERRITIN, IRONPCTSAT     ASSESSMENT/PLAN:  1.  Severe hyponatremia: euvolemic, possibly meds (HCTZ/ zoloft) but  SIADH due to malignancy.  Improved some on salt tablets and furosemide, however Cr bumped up to 1.6 today.  Will dc po lasix and cont NaCl tabs 2gm tid and fluid restriction. Will add  demeclocycline 150 bid. Repeat Uosm. Will follow.  OK for dc if Na+ improves to upper 120's in my opinion.  He has good OP PCP f/u.   Hydrochlorothiazide is contraindicated for future use.   2.  Chronic kidney disease stage III.  Baseline serum creatinine appears to be in the 1.6 range based on prior labs.  His serum creatinine is better than his previously stated baseline.  Appears a bit fluid overloaded on examination.  Would use furosemide to manage his volume as well as the sodium.  Chronic kidney disease likely in the basis of longstanding diabetes and hypertension.  Hold off on ACE inhibition for now.  3.  Metastatic renal cancer: CT scan of chest here is showing disease progression.  F/b Dr Julien Nordmann. Was started on  palliative immunoRx recently. Has mostly lung disease dx'd in Dec 2019 (initial Dx and nephrectomy were in 2008).   4.  Acute pulm embolus: CTA chest +for PE, was on IV heparin now on full-dose Lovenox.   5.  Hypercalcemia: pth low consistent with non parathyroid hyperCa++.  No bony mets on recent PET though, could be just some immobility too. Supportive care.     [facility] 09/11/2018, 4:19 PM

## 2018-09-11 NOTE — Progress Notes (Signed)
PROGRESS NOTE  Blake Hines FYB:[id number] DOB: 07-19-1931 DOA: 09/04/2018 PCP: Crist Infante, MD   LOS: 7 days   Brief Narrative / Interim history: 85 year old male with PMH of metastatic renal cell carcinoma, status post left nephrectomy, on immunotherapy, last cycle approximately 3 weeks ago, stage III chronic kidney disease, COPD, DM 2, HTN, bipolar disorder, presented with ongoing low-grade fever with chills and mild dyspnea at home, decreased appetite and progressive generalized weakness to an extent where he was unable to do his daily activities.  In ED sodium 124, creatinine 1.63.  Admitted for dehydration with hyponatremia and acute on chronic kidney disease.  Hydrated initially with IV saline, dehydration resolved but then his hyponatremia worsened.  Work-up suggested SIADH, fluid restricted, oral Lasix low-dose initiated, sodium improved to 125 but then again worsened to 120.  Patient has dyspnea, hypoxia, persistent sinus tachycardia on telemetry, reported 2 episodes of coughing up small blood clots prior to admission, relatively nonambulatory, underlying cancer diagnosis, concern for pulmonary embolism.  D-dimer positive, VQ scan intermediate study, lower extremity venous Dopplers negative for DVT, TTE without right heart strain.  CTA chest 2/8 confirmed acute PE, possible multifocal pneumonia, superimposed edema, increased mediastinal and hilar adenopathy, pulmonary metastasis.  IV heparin transitioned to full dose Lovenox 2/10.  PMT consulted for goals of care.  Subjective: He is doing well this morning, no shortness of breath while laying in bed but more so when he gets up and moves around.  Denies any chest pain, denies any palpitations.  No fevers or chills overnight.  Assessment & Plan: Active Problems:   Hyponatremia   AKI (acute kidney injury) ([place])   Type 2 diabetes mellitus without complication (HCC)   Bipolar disorder (HCC)   Abnormal liver function test   Principal  Problem: Dehydration with hyponatremia -Suspect due to poor oral intake related to recent immunotherapy, ongoing HCTZ use and also on Zoloft which could cause hyponatremia.  HCTZ and Zoloft discontinued.  Dehydration resolved. -Nephrology consulted, appreciate input.  Sodium somewhat stable and slowly improving at 125 today.  Continue to monitor, continue fluid restriction as well as furosemide  Active problems: Hyponatremia/suspected SIADH  -At baseline patient has sodiums in the mid 20s.  Admission sodium 124, briefly improved with IV saline then dropped to 120.  HCTZ and Zoloft discontinued.  IV saline stopped.  Initial urine and serum osmolarity and urine sodium consistent with SIADH.  Low-dose Lasix started.  Nephrology consulted and added salt tablets.  Sodium improved from 120 > 124> 25 and no mental status changes.  Continue Lasix 20 mg twice daily, salt tablets and fluid restriction.  Follow BMP in a.m.    Acute respiratory failure with hypoxia  -Patient has dyspnea, hypoxia, persistent sinus tachycardia on telemetry, reported 2 episodes of coughing up small blood clots prior to admission, relatively nonambulatory, underlying cancer diagnosis, concern for pulmonary embolism. -His d-dimer was positive, he had a VQ scan which is an intermediate study.  Lower extremity venous Dopplers were negative for DVT.  2D echo without right heart strain. -He eventually underwent a CT angiogram on 2/8 which confirmed acute PE, possible multifocal pneumonia and superimposed edema. -Currently persistently hypoxic requiring supplemental oxygen.  We will try to wean off but may need home O2 on discharge  Acute pulmonary embolism -Treated initially with IV heparin for >48 hours.  Dr. Algis Liming discussed with patient's primary oncologist Dr. Julien Nordmann on 2/10 who recommends preferably full dose Lovenox but if that is not possible then Xarelto.  Dr. Algis Liming discussed in detail with patient and he too prefers  Lovenox.  IV heparin changed to full dose Lovenox on 2/10.  Multifocal pneumonia -Seen on CTA chest.  Procalcitonin 0.1 > 0.15.  Continue levofloxacin, day 4 of 7.  Hypercalcemia, mild -Likely complicated by dehydration.  Uncorrected serum calcium 11.2, PTH sample not appropriate, repeat PTH 2/10.  ? Consideration for bisphosphonates.  Acute on stage III chronic kidney disease -Baseline creatinine may be in the 1.6 range.  Presented with creatinine of 1.91.  Creatinine has gone up slightly from 1.3-1.4. -Creatinine slightly up at 1.68, he is on Lasix.  Nephrology following.  Of note, he did have contrast for CT angiogram  DM2 with renal complications -Hold Januvia.  Continue NovoLog SSI.  CBGs uncontrolled in the low 200s.  Added Lantus 5 units subcutaneously daily.  We will add mealtime coverage NovoLog today  Essential hypertension -HCTZ discontinued indefinitely.  Losartan held due to SIADH.  PRN IV hydralazine.  Started amlodipine 2.5 mg daily, blood pressure still been on the high side, will increase Norvasc today.  COPD -Stable without clinical bronchospasm.  Hyperlipidemia -Continue TriCor.  Bipolar disorder -Stable.  Continue Zyprexa 2.5 mg at bedtime.  Abnormal LFTs -No GI symptoms other than decreased appetite.  Unclear etiology but seem to be improving.?  Related to cancer treatment.  Outpatient follow-up. -LFTs continue to improve today 2/11  Metastatic renal cell carcinoma -Outpatient follow-up with Dr. Julien Nordmann.  CTA chest shows pulmonary nodules, increasing mediastinal and hilar adenopathy.  Discussed with Dr. Julien Nordmann, oncology on 2/10 and he recommended getting palliative care consultation for goals of care-ordered.  ST -Likely related to acute lung issues noted above.  Asymptomatic.  Please see discussion above.  TSH >10.  Free T3 and free T4: WNL.?  Sick euthyroid.  Consider repeating TSH in 4 to 6 weeks.  Improved and stable.   Scheduled Meds: . amLODipine   2.5 mg Oral Daily  . cholecalciferol  1,000 Units Oral Daily  . enoxaparin (LOVENOX) injection  1 mg/kg Subcutaneous Q12H  . feeding supplement (ENSURE ENLIVE)  237 mL Oral BID BM  . furosemide  20 mg Oral BID  . insulin aspart  0-5 Units Subcutaneous QHS  . insulin aspart  0-9 Units Subcutaneous TID WC  . insulin glargine  5 Units Subcutaneous Daily  . latanoprost  1 drop Both Eyes QHS  . levofloxacin  750 mg Oral Q48H  . OLANZapine  2.5 mg Oral QHS  . sodium chloride  2 g Oral TID WC   Continuous Infusions: PRN Meds:.acetaminophen, hydrALAZINE  DVT prophylaxis: Full dose Lovenox Code Status: Full code Family Communication: No family at bedside, plan to discuss with daughter later today at bedside Disposition Plan: Pending clinical improvement home 1 to 2 days  Consultants:   Pulmonology  Nephrology  Procedures:   2D echo:   IMPRESSIONS 1. The left ventricle has low normal systolic function of 62-69%. The cavity size was normal. There is no increased left ventricular wall thickness. Left ventricular diastology could not be evaluated due to indeterminent diastolic function.  Indeterminent filling pressures There is abnormal septal motion due to conduction delay.  2. The right ventricle has normal systolic function. The cavity was mildly enlarged. There is no increase in right ventricular wall thickness. Right ventricular systolic pressure could not be assessed.  3. The mitral valve is normal in structure.  4. The tricuspid valve is normal in structure.  5. The pulmonic valve was normal in structure.  6. The aortic valve is normal in structure.  Antimicrobials:  Levaquin  Objective: Vitals:   09/10/18 1417 09/10/18 2031 09/10/18 2141 09/11/18 0603  BP:  137/83  (!) 159/93  Pulse: (!) 122 (!) 118 (!) 110   Resp:  20  20  Temp: 98 F (36.7 C) 98.2 F (36.8 C)  98 F (36.7 C)  TempSrc: Oral     SpO2:  92%  94%  Weight:      Height:        Intake/Output Summary  (Last 24 hours) at 09/11/2018 1105 Last data filed at 09/11/2018 0800 Gross per 24 hour  Intake 354.7 ml  Output 825 ml  Net -470.3 ml   Filed Weights   09/04/18 0950  Weight: 80.5 kg    Examination:  Constitutional: NAD Eyes: PERRL, lids and conjunctivae normal ENMT: Mucous membranes are moist.  Neck: normal, suppley Respiratory: clear to auscultation bilaterally, no wheezing, no crackles. Normal respiratory effort.  Cardiovascular: Regular rate and rhythm, no murmurs / rubs / gallops. No LE edema. 2+ pedal pulses. Abdomen: no tenderness. Bowel sounds positive.  Musculoskeletal: no clubbing / cyanosis. Skin: no rashes Neurologic: CN 2-12 grossly intact. Strength 5/5 in all 4.  Psychiatric: Normal judgment and insight. Alert and oriented x 3. Normal mood.    Data Reviewed: I have independently reviewed following labs and imaging studies   CBC: Recent Labs  Lab 09/05/18 0640 09/08/18 0959 09/09/18 0624 09/10/18 0626 09/11/18 0600  WBC 9.4 10.6* 11.6* 10.8* 12.2*  HGB 12.0* 12.9* 13.3 13.2 13.4  HCT 36.2* 37.7* 39.5 40.3 41.0  MCV 85.6 83.6 82.1 84.1 86.1  PLT 317 335 433* 420* 269*   Basic Metabolic Panel: Recent Labs  Lab 09/08/18 0019 09/09/18 0624 09/09/18 1858 09/10/18 0626 09/10/18 1424 09/11/18 0600  NA 120* 120* 124* 124*  --  125*  K 4.2 4.4 4.5 4.4  --  4.5  CL 84* 83* 88* 87*  --  87*  CO2 27 26 24 28   --  27  GLUCOSE 177* 171* 182* 197*  --  184*  BUN 29* 27* 30* 33*  --  40*  CREATININE 1.35* 1.31* 1.39* 1.47*  --  1.68*  CALCIUM 10.5* 10.2  REJ5 10.9* 11.2* 12.4* 11.7*   GFR: Estimated Creatinine Clearance: 31.1 mL/min (A) (by C-G formula based on SCr of 1.68 mg/dL (H)). Liver Function Tests: Recent Labs  Lab 09/05/18 0640 09/07/18 0609 09/11/18 0600  AST 56* 55* 46*  ALT 148* 136* 117*  ALKPHOS 49 47 48  BILITOT 0.7 0.8 0.8  PROT 5.8* 5.9* 6.2*  ALBUMIN 2.6* 2.6* 2.8*   No results for input(s): LIPASE, AMYLASE in the last 168  hours. No results for input(s): AMMONIA in the last 168 hours. Coagulation Profile: Recent Labs  Lab 09/08/18 0019  INR 1.19   Cardiac Enzymes: No results for input(s): CKTOTAL, CKMB, CKMBINDEX, TROPONINI in the last 168 hours. BNP (last 3 results) No results for input(s): PROBNP in the last 8760 hours. HbA1C: No results for input(s): HGBA1C in the last 72 hours. CBG: Recent Labs  Lab 09/10/18 0719 09/10/18 1128 09/10/18 1640 09/10/18 2032 09/11/18 0808  GLUCAP 213* 264* 236* 207* 193*   Lipid Profile: No results for input(s): CHOL, HDL, LDLCALC, TRIG, CHOLHDL, LDLDIRECT in the last 72 hours. Thyroid Function Tests: No results for input(s): TSH, T4TOTAL, FREET4, T3FREE, THYROIDAB in the last 72 hours. Anemia Panel: No results for input(s): VITAMINB12, FOLATE, FERRITIN, TIBC, IRON, RETICCTPCT in the  last 72 hours. Urine analysis:    Component Value Date/Time   COLORURINE YELLOW 09/04/2018 1110   APPEARANCEUR CLEAR 09/04/2018 1110   LABSPEC 1.011 09/04/2018 1110   PHURINE 7.0 09/04/2018 1110   GLUCOSEU NEGATIVE 09/04/2018 1110   HGBUR NEGATIVE 09/04/2018 [place] 09/04/2018 [place] 09/04/2018 [place] 09/04/2018 1110   NITRITE NEGATIVE 09/04/2018 1110   LEUKOCYTESUR NEGATIVE 09/04/2018 1110   Sepsis Labs: Invalid input(s): PROCALCITONIN, LACTICIDVEN  Recent Results (from the past 240 hour(s))  Blood culture (routine x 2)     Status: None   Collection Time: 09/04/18 10:39 AM  Result Value Ref Range Status   Specimen Description   Final    BLOOD BLOOD LEFT FOREARM Performed at [facility] [address] [place], [place] [address]    Special Requests   Final    BOTTLES DRAWN AEROBIC AND ANAEROBIC Blood Culture results may not be optimal due to an excessive volume of blood received in culture bottles Performed at [facility] [address], [place], [place] [address]      Culture   Final    NO GROWTH 5 DAYS Performed at [facility], [place] [address] [place], [place] [address]    Report Status 09/09/2018 FINAL  Final  Urine culture     Status: None   Collection Time: 09/04/18 11:10 AM  Result Value Ref Range Status   Specimen Description   Final    URINE, RANDOM Performed at [facility] [address] [place], [place] [address]    Special Requests   Final    NONE Performed at [facility], [place] [address], [place], [place] [address]    Culture   Final    NO GROWTH Performed at [facility], [place] [address] [place], [place] [address]    Report Status 09/05/2018 FINAL  Final  Blood culture (routine x 2)     Status: None   Collection Time: 09/04/18 11:53 AM  Result Value Ref Range Status   Specimen Description   Final    BLOOD LEFT ANTECUBITAL Performed at [facility] [address], [place], [place] [address]    Special Requests   Final    BOTTLES DRAWN AEROBIC AND ANAEROBIC Blood Culture adequate volume Performed at [place] [address] [place], [place] [address]    Culture   Final    NO GROWTH 5 DAYS Performed at [facility], [place] [address], [place], [place] [address]    Report Status 09/09/2018 FINAL  Final  Culture, blood (Routine X 2) w Reflex to ID Panel     Status: None (Preliminary result)   Collection Time: 09/08/18  9:59 AM  Result Value Ref Range Status   Specimen Description   Final    BLOOD LEFT ANTECUBITAL Performed at [facility] [address] [place], [place] [address]    Special Requests   Final    BOTTLES DRAWN AEROBIC ONLY Blood Culture adequate volume Performed at [facility] [address], [place], [place] [address]    Culture   Final    NO GROWTH 3 DAYS Performed at [facility], [place] [address], [place], [place] [address]    Report Status PENDING   Incomplete  Culture, blood (Routine X 2) w Reflex to ID Panel     Status: None (  Preliminary result)   Collection Time: 09/08/18 10:06 AM  Result Value Ref Range Status   Specimen Description   Final    BLOOD LEFT ARM Performed at [facility] [address], [place], [place] [address]    Special Requests   Final    BOTTLES DRAWN AEROBIC AND ANAEROBIC Blood Culture adequate volume Performed at [facility] [address] [place], [place] [address]    Culture   Final    NO GROWTH 3 DAYS Performed at [facility], [place] [address] [place], [place] [address]    Report Status PENDING  Incomplete      Radiology Studies: No results found.   Marzetta Board, MD, PhD [facility]  Contact via  [email or web address]  [place] P: [phone]  F: [phone]

## 2018-09-12 ENCOUNTER — Telehealth: Payer: Self-pay | Admitting: Internal Medicine

## 2018-09-12 DIAGNOSIS — J9601 Acute respiratory failure with hypoxia: Secondary | ICD-10-CM | POA: Diagnosis present

## 2018-09-12 DIAGNOSIS — F310 Bipolar disorder, current episode hypomanic: Secondary | ICD-10-CM

## 2018-09-12 LAB — BASIC METABOLIC PANEL
ANION GAP: 10 (ref 5–15)
BUN: 45 mg/dL — ABNORMAL HIGH (ref 8–23)
CO2: 28 mmol/L (ref 22–32)
Calcium: 12.1 mg/dL — ABNORMAL HIGH (ref 8.9–10.3)
Chloride: 91 mmol/L — ABNORMAL LOW (ref 98–111)
Creatinine, Ser: 1.79 mg/dL — ABNORMAL HIGH (ref 0.61–1.24)
GFR calc Af Amer: 39 mL/min — ABNORMAL LOW (ref >60)
GFR calc non Af Amer: 34 mL/min — ABNORMAL LOW (ref >60)
GLUCOSE: 169 mg/dL — AB (ref 70–99)
Potassium: 4.3 mmol/L (ref 3.5–5.1)
Sodium: 129 mmol/L — ABNORMAL LOW (ref 135–145)

## 2018-09-12 LAB — GLUCOSE, CAPILLARY
GLUCOSE-CAPILLARY: 260 mg/dL — AB (ref 70–99)
Glucose-Capillary: 179 mg/dL — ABNORMAL HIGH (ref 70–99)
Glucose-Capillary: 226 mg/dL — ABNORMAL HIGH (ref 70–99)

## 2018-09-12 LAB — CBC
HCT: 40.5 % (ref 39.0–52.0)
Hemoglobin: 13 g/dL (ref 13.0–17.0)
MCH: 27.5 pg (ref 26.0–34.0)
MCHC: 32.1 g/dL (ref 30.0–36.0)
MCV: 85.8 fL (ref 80.0–100.0)
NRBC: 0 % (ref 0.0–0.2)
PLATELETS: 451 10*3/uL — AB (ref 150–400)
RBC: 4.72 MIL/uL (ref 4.22–5.81)
RDW: 12.6 % (ref 11.5–15.5)
WBC: 12.8 10*3/uL — ABNORMAL HIGH (ref 4.0–10.5)

## 2018-09-12 LAB — MAGNESIUM: Magnesium: 1.9 mg/dL (ref 1.7–2.4)

## 2018-09-12 MED ORDER — INSULIN GLARGINE 100 UNIT/ML ~~LOC~~ SOLN
7.0000 [IU] | Freq: Every day | SUBCUTANEOUS | Status: DC
  Administered 2018-09-13 – 2018-09-15 (×3): 7 [IU] via SUBCUTANEOUS
  Filled 2018-09-12 (×3): qty 0.07

## 2018-09-12 MED ORDER — SODIUM CHLORIDE 0.9 % IV SOLN
60.0000 mg | Freq: Once | INTRAVENOUS | Status: AC
  Administered 2018-09-12: 60 mg via INTRAVENOUS
  Filled 2018-09-12: qty 20

## 2018-09-12 MED ORDER — ENOXAPARIN SODIUM 80 MG/0.8ML ~~LOC~~ SOLN
1.0000 mg/kg | SUBCUTANEOUS | Status: DC
  Administered 2018-09-12 – 2018-09-14 (×3): 80 mg via SUBCUTANEOUS
  Filled 2018-09-12 (×4): qty 0.8

## 2018-09-12 MED ORDER — INSULIN ASPART 100 UNIT/ML ~~LOC~~ SOLN
3.0000 [IU] | Freq: Three times a day (TID) | SUBCUTANEOUS | Status: DC
  Administered 2018-09-13 – 2018-09-15 (×8): 3 [IU] via SUBCUTANEOUS

## 2018-09-12 NOTE — Telephone Encounter (Signed)
Cancelled apt per 2/12 sch message.

## 2018-09-12 NOTE — Progress Notes (Signed)
SATURATION QUALIFICATIONS: (This note is used to comply with regulatory documentation for home oxygen)  Patient Saturations on Room Air at Rest = 84%  Patient Saturations on Room Air while Ambulating = 84%  Patient Saturations on 4 Liters of oxygen while Ambulating = 87%  Please briefly explain why patient needs home oxygen:  Patient with SOB and hypoxia with ambulation on RA.  Magda Kiel, [place] [phone] 09/12/2018

## 2018-09-12 NOTE — Progress Notes (Addendum)
[facility]                                                                              Patient Demographics  Blake Hines, is a 85 y.o. male, DOB - April 04, 1931, OBS:[id number]  Admit date - 09/04/2018   Admitting Physician Georgette Shell, MD  Outpatient Primary MD for the patient is Crist Infante, MD  Outpatient specialists:   LOS - 8  days   Medical records reviewed and are as summarized below:    Chief Complaint  Patient presents with  . abnormal labs  . Cancer    Renal with mets  . Shortness of Breath       Brief summary   85 year old male with PMH of metastatic renal cell carcinoma, status post left nephrectomy, on immunotherapy, last cycle approximately 3 weeks ago, stage III CKD, COPD, DM 2, HTN, bipolar disorder, presented with ongoing low-grade fever with chills and mild dyspnea at home, decreased appetite and progressive generalized weakness to an extent where he was unable to do his daily activities.  In ED sodium 124, creatinine 1.63.  -Patient was admitted for dehydration with hyponatremia, acute on chronic CKD Hydrated initially with IV saline, dehydration resolved but then his hyponatremia worsened. Work-up suggested SIADH, fluid restricted, oral Lasix low-dose initiated, sodium improved to 125 but then again worsened to 120. Patient has dyspnea, hypoxia, persistent sinus tachycardia on telemetry, reported 2 episodes of coughing up small blood clots prior to admission, relatively nonambulatory, underlying cancer diagnosis, concern for pulmonary embolism. D-dimer positive, VQ scan intermediate study, lower extremity venous Dopplers negative for DVT, TTE without right heart strain. CTA chest 2/8 confirmed acute PE, possible multifocal pneumonia, superimposed edema, increased mediastinal and hilar adenopathy, pulmonary metastasis.IV heparin transitioned to full dose Lovenox 2/10.  Palliative consulted for goals of care.    Assessment & Plan      Principal Problem: Dehydration with hyponatremia -Likely due to poor oral intake, recent immunotherapy, ongoing HCTZ use, Zoloft -Both HCTZ and Zoloft discontinued, patient was placed on IV fluid hydration, dehydration resolved.   -Nephrology following, sodium improving, 129 -Continue fluid restriction, NaCl tabs, 2 g 3 times daily, demeclocycline 150 mg twice daily   Active problems: Hyponatremia/suspected SIADH  -At baseline, sodium in mid 120s, at the time of admission sodium 124 however dropped down to 120 with IV saline.  - HCTZ and Zoloft has been discontinued -Sodium improving, 129, no mental status changes -Continue fluid restriction, NaCl tabs, demeclocycline.   Acute respiratory failure with hypoxia  secondary to acute pulmonary embolism -Dyspnea with hypoxia and small hemoptysis 2 episodes prior to admission, has underlying metastatic renal CA -CT angiogram 2/8 confirmed acute pulmonary embolism with multifocal pneumonia and superimposed edema.  Lower extremity Dopplers negative for DVT. -2D echo showed no right heart strain. -Initially treated with IV heparin primary oncologist, Dr. Julien Nordmann on 2/10 who recommended full dose Lovenox.  Patient was agreeable, continue full dose Lovenox. -O2 sats 93% on 2 L, will need home O2 evaluation upon discharge.   Multifocal pneumonia -Seen on CTA chest, continue levofloxacin day #5/7  Hypercalcemia -Corrected calcium 13.1 with albumin of 2.8,  has been trending up -PTH low consistent with non-parathyroid hypercalcemia, no bony mets on recent PET -Unable to give IV fluids due to SIADH/hyponatremia, will give 1 dose of Aredia  Acute on chronic CKD stage III -Baseline creatinine 1.6, presented with creatinine of 1.9 -Creatinine was improving, now trending up to 1.7, Patient also received contrast for CT angiogram. -Lasix has been discontinued, hopefully will improve renal function.  Nephrology following   Diabetes mellitus  type 2 with renal complications -Continue sliding scale insulin, continue Lantus, increase to 7units daily -Increase NovoLog meal coverage to 3 units 3 times daily AC  Essential hypertension -HCTZ discontinued indefinitely, losartan held due to SIADH -Continue amlodipine, BP improving  COPD Currently stable, no wheezing   Hyperlipidemia Continue [facility]  History of bipolar disorder Currently stable, continue Zyprexa  Abnormal LFTs -Improving, unclear etiology, possibly due to immunotherapy  Metastatic renal cell CA -Continue outpatient follow-up with Dr. Julien Nordmann - CTA chest shows pulmonary nodules, increasing mediastinal and hilar adenopathy.  -Palliative consulted, per patient and daughter, full scope of treatment   Sinus tachycardia -Likely due to acute pulmonary embolism, TSH> 10, free T4N T3 normal possibly sick euthyroid syndrome -Follow thyroid studies in 4 to 6 weeks.  Code Status: Full code DVT Prophylaxis: Lovenox Family Communication: Discussed in detail with the patient, all imaging results, lab results explained to the patient and daughter in detail   Disposition Plan: PT evaluation pending, needs home O2 evaluation  Time Spent in minutes   35 minutes  Procedures:  2D echo: EF 50 to 55%, right ventricle normal systolic function  Consultants:   Pulmonology Nephrology  Antimicrobials:   Anti-infectives (From admission, onward)   Start     Dose/Rate Route Frequency Ordered Stop   09/11/18 2200  demeclocycline (DECLOMYCIN) tablet 150 mg     150 mg Oral Every 12 hours 09/11/18 1614     09/10/18 1000  levofloxacin (LEVAQUIN) tablet 750 mg     750 mg Oral Every 48 hours 09/09/18 1444     09/08/18 1030  levofloxacin (LEVAQUIN) IVPB 750 mg  Status:  Discontinued     750 mg 100 mL/hr over 90 Minutes Intravenous Every 48 hours 09/08/18 0940 09/09/18 1118         Medications  Scheduled Meds: . amLODipine  5 mg Oral Daily  . cholecalciferol  1,000  Units Oral Daily  . demeclocycline  150 mg Oral Q12H  . enoxaparin (LOVENOX) injection  1 mg/kg Subcutaneous Q24H  . feeding supplement (ENSURE ENLIVE)  237 mL Oral BID BM  . insulin aspart  0-5 Units Subcutaneous QHS  . insulin aspart  0-9 Units Subcutaneous TID WC  . insulin aspart  2 Units Subcutaneous TID WC  . insulin glargine  5 Units Subcutaneous Daily  . latanoprost  1 drop Both Eyes QHS  . levofloxacin  750 mg Oral Q48H  . OLANZapine  2.5 mg Oral QHS  . sodium chloride  2 g Oral TID WC   Continuous Infusions: PRN Meds:.acetaminophen, hydrALAZINE      Subjective:   Blake Hines was seen and examined today.  Denies any new complaints, hoping to go home soon. Patient denies dizziness, chest pain, shortness of breath, abdominal pain, N/V/D/C, new weakness, numbess, tingling. No acute events overnight.    Objective:   Vitals:   09/12/18 0511 09/12/18 0942 09/12/18 1003 09/12/18 1159  BP: (!) 137/91 (!) 141/80 (!) 143/74 137/81  Pulse: (!) 118 (!) 110 (!) 112 (!) 113  Resp: 18  18 (!) 21 (!) 23  Temp: 97.6 F (36.4 C)   (!) 97.5 F (36.4 C)  TempSrc: Oral     SpO2: 92% 94% 93% 93%  Weight: 75.3 kg     Height:        Intake/Output Summary (Last 24 hours) at 09/12/2018 1202 Last data filed at 09/12/2018 1007 Gross per 24 hour  Intake 516 ml  Output 1825 ml  Net -1309 ml     Wt Readings from Last 3 Encounters:  09/12/18 75.3 kg  08/16/18 80.6 kg  07/26/18 82.8 kg     Exam  General: Alert and oriented x 3, NAD  Eyes:  HEENT:  Atraumatic, normocephalic  Cardiovascular: S1 S2 auscultated, Regular rate and rhythm.  Respiratory: Clear to auscultation bilaterally, no wheezing, rales or rhonchi  Gastrointestinal: Soft, nontender, nondistended, + bowel sounds  Ext: no pedal edema bilaterally  Neuro: No new deficits  Musculoskeletal: No digital cyanosis, clubbing  Skin: No rashes  Psych: Normal affect and demeanor, alert and oriented x3    Data  Reviewed:  I have personally reviewed following labs and imaging studies  Micro Results Recent Results (from the past 240 hour(s))  Blood culture (routine x 2)     Status: None   Collection Time: 09/04/18 10:39 AM  Result Value Ref Range Status   Specimen Description   Final    BLOOD BLOOD LEFT FOREARM Performed at [facility] [address], [place], [place] [address]    Special Requests   Final    BOTTLES DRAWN AEROBIC AND ANAEROBIC Blood Culture results may not be optimal due to an excessive volume of blood received in culture bottles Performed at [facility] [address] [place], [place] [address]    Culture   Final    NO GROWTH 5 DAYS Performed at [facility], [place] [address] [place], [place] [address]    Report Status 09/09/2018 FINAL  Final  Urine culture     Status: None   Collection Time: 09/04/18 11:10 AM  Result Value Ref Range Status   Specimen Description   Final    URINE, RANDOM Performed at [facility] [address] [place], [place] [address]    Special Requests   Final    NONE Performed at [facility], [place] [address], [place], [place] [address]    Culture   Final    NO GROWTH Performed at [facility], [place] [address], [place], [place] [address]    Report Status 09/05/2018 FINAL  Final  Blood culture (routine x 2)     Status: None   Collection Time: 09/04/18 11:53 AM  Result Value Ref Range Status   Specimen Description   Final    BLOOD LEFT ANTECUBITAL Performed at [facility] [address], [place], [place] [address]    Special Requests   Final    BOTTLES DRAWN AEROBIC AND ANAEROBIC Blood Culture adequate volume Performed at [facility] [address] [place], [place] [address]    Culture   Final    NO GROWTH 5 DAYS Performed at [facility], [place] [address], [place], [place] [address]    Report Status  09/09/2018 FINAL  Final  Culture, blood (Routine X 2) w Reflex to ID Panel     Status: None (Preliminary result)   Collection Time: 09/08/18  9:59 AM  Result Value Ref Range Status  Specimen Description   Final    BLOOD LEFT ANTECUBITAL Performed at [facility] [address] [place], [place] [address]    Special Requests   Final    BOTTLES DRAWN AEROBIC ONLY Blood Culture adequate volume Performed at [facility] [address], [place], [place] [address]    Culture   Final    NO GROWTH 4 DAYS Performed at [facility], [place] [address] [place], [place] [address]    Report Status PENDING  Incomplete  Culture, blood (Routine X 2) w Reflex to ID Panel     Status: None (Preliminary result)   Collection Time: 09/08/18 10:06 AM  Result Value Ref Range Status   Specimen Description   Final    BLOOD LEFT ARM Performed at [facility] [address] [place], [place] [address]    Special Requests   Final    BOTTLES DRAWN AEROBIC AND ANAEROBIC Blood Culture adequate volume Performed at [facility] [address] [place], [place] [address]    Culture   Final    NO GROWTH 4 DAYS Performed at [facility], [place] [address], [place], [place] [address]    Report Status PENDING  Incomplete    Radiology Reports Dg Chest 2 View  Result Date: 09/04/2018 CLINICAL DATA:  Shortness of breath EXAM: CHEST - 2 VIEW COMPARISON:  08/03/2018 FINDINGS: Patchy bilateral lower lobe airspace opacities are new since prior study concerning for pneumonia. Heart is normal size. No effusions or pneumothorax. No acute bony abnormality. IMPRESSION: Patchy bilateral lower lobe airspace opacities concerning for pneumonia. Electronically Signed   By: Rolm Baptise M.D.   On: 09/04/2018 10:24   Ct Angio Chest Pe W Or Wo Contrast  Result Date: 09/08/2018 CLINICAL DATA:  History of metastatic renal cell carcinoma. EXAM: CT  ANGIOGRAPHY CHEST WITH CONTRAST TECHNIQUE: Multidetector CT imaging of the chest was performed using the standard protocol during bolus administration of intravenous contrast. Multiplanar CT image reconstructions and MIPs were obtained to evaluate the vascular anatomy. CONTRAST:  74mL ISOVUE-370 IOPAMIDOL (ISOVUE-370) INJECTION 76% COMPARISON:  PET-CT 08/10/2018 FINDINGS: Cardiovascular: Normal heart size. Small pericardial effusion. Aorta and main pulmonary artery normal in caliber. Thoracic aortic vascular calcifications. Nonocclusive embolus demonstrated within the distal right lower lobe pulmonary artery (image 164; series 5). Additionally embolus is demonstrated within the right middle lobe segmental and subsegmental pulmonary arteries. Small amount of embolic material demonstrated within the right upper lobe pulmonary arteries. RV/LV ratio less than 1. Mediastinum/Nodes: Re demonstrated extensive mediastinal and hilar adenopathy. Slight interval increase in size of subcarinal lymph node measuring 4.7 cm (image 53; series 4), previously 4.2 cm. Similar-appearing 2.5 cm left inferior hilar lymph node (image 65; series 4), previously 2.5 cm. Interval increase in size of left hilar lymph node measuring 1.5 cm (image 53; series 4), previously 1.0 cm. Lungs/Pleura: Similar-appearing scattered pulmonary nodules. Interval development of masslike area of consolidation within the left lower lobe measuring 6.1 x 3.9 cm. Additionally there is new predominately subpleural ground-glass and consolidative opacities throughout the left lung and right lower lobe. Upper Abdomen: No acute process. Musculoskeletal: Thoracic spine degenerative changes. No aggressive or acute appearing osseous lesions identified. Review of the MIP images confirms the above findings. IMPRESSION: 1. Findings positive for pulmonary embolus predominately within the right middle and right lower lobes as well as right upper lobe. 2. Interval development  of a large (6 cm) masslike area of consolidation within  the left lower lobe. Additionally there is new patchy predominately subpleural ground-glass and consolidative opacities throughout the left lung and right lower lobe. Overall findings are concerning for multifocal infectious process. Possibility of superimposed edema not excluded. 3. Interval increase in size of mediastinal and hilar adenopathy. 4. Re demonstrated scattered pulmonary nodules compatible with known metastatic disease. 5. Critical Value/emergent results were called by telephone at the time of interpretation on 09/08/2018 at 6:54 pm to Dr. Vernell Leep , who verbally acknowledged these results. Electronically Signed   By: Lovey Newcomer M.D.   On: 09/08/2018 18:56   Nm Pulmonary Perf And Vent  Result Date: 09/07/2018 CLINICAL DATA:  Stage IV renal cell carcinoma. Elevated D-dimer. Clinical concern for pulmonary embolism. COPD. EXAM: NUCLEAR MEDICINE VENTILATION - PERFUSION LUNG SCAN TECHNIQUE: Ventilation images were obtained in multiple projections using inhaled aerosol Tc-53m DTPA. Perfusion images were obtained in multiple projections after intravenous injection of Tc-92m MAA. RADIOPHARMACEUTICALS:  30.2 mCi of Tc-70m DTPA aerosol inhalation and 4.2 mCi Tc49m MAA IV COMPARISON:  09/06/2018 chest radiograph. FINDINGS: Ventilation: Limited heterogeneous radiotracer throughout both lungs on the ventilation images, with central airway radiotracer accumulation. Perfusion: Numerous large matched segmental perfusion defects throughout both lungs, including within the lower lung zones. IMPRESSION: Intermediate probability for pulmonary embolism (20-79%) by PIOPED II criteria. Electronically Signed   By: Ilona Sorrel M.D.   On: 09/07/2018 23:23   Dg Chest Port 1 View  Result Date: 09/06/2018 CLINICAL DATA:  Shortness of breath EXAM: PORTABLE CHEST 1 VIEW COMPARISON:  Two days ago FINDINGS: Asymmetric left lower lung opacity compatible with pneumonia.  Pulmonary metastatic disease based on recent PET-CT. COPD. Normal heart size. Aortic tortuosity. IMPRESSION: 1. Unchanged asymmetric left lower pulmonary opacity primarily concerning for pneumonia. 2. Pulmonary metastatic disease. Electronically Signed   By: Monte Fantasia M.D.   On: 09/06/2018 10:31   Vas [place] Lower Extremity Venous (dvt)  Result Date: 09/08/2018  Lower Venous Study Indications: Pulmonary embolism.  Performing Technologist: Abram Sander RVS  Examination Guidelines: A complete evaluation includes B-mode imaging, spectral Doppler, color Doppler, and power Doppler as needed of all accessible portions of each vessel. Bilateral testing is considered an integral part of a complete examination. Limited examinations for reoccurring indications may be performed as noted.  Right Venous Findings: +---------+---------------+---------+-----------+----------+-------+          CompressibilityPhasicitySpontaneityPropertiesSummary +---------+---------------+---------+-----------+----------+-------+ CFV      Full           Yes      Yes                          +---------+---------------+---------+-----------+----------+-------+ SFJ      Full                                                 +---------+---------------+---------+-----------+----------+-------+ FV Prox  Full                                                 +---------+---------------+---------+-----------+----------+-------+ FV Mid   Full                                                 +---------+---------------+---------+-----------+----------+-------+  FV DistalFull                                                 +---------+---------------+---------+-----------+----------+-------+ PFV      Full                                                 +---------+---------------+---------+-----------+----------+-------+ POP      Full           Yes      Yes                           +---------+---------------+---------+-----------+----------+-------+ PTV      Full                                                 +---------+---------------+---------+-----------+----------+-------+ PERO     Full                                                 +---------+---------------+---------+-----------+----------+-------+  Left Venous Findings: +---------+---------------+---------+-----------+----------+-------+          CompressibilityPhasicitySpontaneityPropertiesSummary +---------+---------------+---------+-----------+----------+-------+ CFV      Full           Yes      Yes                          +---------+---------------+---------+-----------+----------+-------+ SFJ      Full                                                 +---------+---------------+---------+-----------+----------+-------+ FV Prox  Full                                                 +---------+---------------+---------+-----------+----------+-------+ FV Mid   Full                                                 +---------+---------------+---------+-----------+----------+-------+ FV DistalFull                                                 +---------+---------------+---------+-----------+----------+-------+ PFV      Full                                                 +---------+---------------+---------+-----------+----------+-------+  POP      Full           Yes      Yes                          +---------+---------------+---------+-----------+----------+-------+ PTV      Full                                                 +---------+---------------+---------+-----------+----------+-------+ PERO     Full                                                 +---------+---------------+---------+-----------+----------+-------+    Summary: Right: There is no evidence of deep vein thrombosis in the lower extremity. No cystic structure found in the popliteal  fossa. Left: There is no evidence of deep vein thrombosis in the lower extremity. No cystic structure found in the popliteal fossa.  *See table(s) above for measurements and observations. Electronically signed by Monica Martinez MD on 09/08/2018 at 10:29:12 AM.    Final     Lab Data:  CBC: Recent Labs  Lab 09/08/18 0959 09/09/18 0624 09/10/18 0626 09/11/18 0600 09/12/18 0609  WBC 10.6* 11.6* 10.8* 12.2* 12.8*  HGB 12.9* 13.3 13.2 13.4 13.0  HCT 37.7* 39.5 40.3 41.0 40.5  MCV 83.6 82.1 84.1 86.1 85.8  PLT 335 433* 420* 506* 025*   Basic Metabolic Panel: Recent Labs  Lab 09/09/18 0624 09/09/18 1858 09/10/18 0626 09/10/18 1424 09/11/18 0600 09/12/18 0629  NA 120* 124* 124*  --  125* 129*  K 4.4 4.5 4.4  --  4.5 4.3  CL 83* 88* 87*  --  87* 91*  CO2 26 24 28   --  27 28  GLUCOSE 171* 182* 197*  --  184* 169*  BUN 27* 30* 33*  --  40* 45*  CREATININE 1.31* 1.39* 1.47*  --  1.68* 1.79*  CALCIUM 10.2  REJ5 10.9* 11.2* 12.4* 11.7* 12.1*  MG  --   --   --   --   --  1.9   GFR: Estimated Creatinine Clearance: 29.2 mL/min (A) (by C-G formula based on SCr of 1.79 mg/dL (H)). Liver Function Tests: Recent Labs  Lab 09/07/18 0609 09/11/18 0600  AST 55* 46*  ALT 136* 117*  ALKPHOS 47 48  BILITOT 0.8 0.8  PROT 5.9* 6.2*  ALBUMIN 2.6* 2.8*   No results for input(s): LIPASE, AMYLASE in the last 168 hours. No results for input(s): AMMONIA in the last 168 hours. Coagulation Profile: Recent Labs  Lab 09/08/18 0019  INR 1.19   Cardiac Enzymes: No results for input(s): CKTOTAL, CKMB, CKMBINDEX, TROPONINI in the last 168 hours. BNP (last 3 results) No results for input(s): PROBNP in the last 8760 hours. HbA1C: No results for input(s): HGBA1C in the last 72 hours. CBG: Recent Labs  Lab 09/11/18 0808 09/11/18 1134 09/11/18 1707 09/11/18 2345 09/12/18 0740  GLUCAP 193* 218* 174* 250* 179*   Lipid Profile: No results for input(s): CHOL, HDL, LDLCALC, TRIG, CHOLHDL,  LDLDIRECT in the last 72 hours. Thyroid Function Tests: No results for input(s): TSH, T4TOTAL, FREET4, T3FREE, THYROIDAB in the last 72 hours. Anemia Panel: No results  for input(s): VITAMINB12, FOLATE, FERRITIN, TIBC, IRON, RETICCTPCT in the last 72 hours. Urine analysis:    Component Value Date/Time   COLORURINE YELLOW 09/04/2018 1110   APPEARANCEUR CLEAR 09/04/2018 1110   LABSPEC 1.011 09/04/2018 1110   PHURINE 7.0 09/04/2018 1110   GLUCOSEU NEGATIVE 09/04/2018 1110   HGBUR NEGATIVE 09/04/2018 1110   BILIRUBINUR NEGATIVE 09/04/2018 1110   KETONESUR NEGATIVE 09/04/2018 1110   PROTEINUR NEGATIVE 09/04/2018 1110   NITRITE NEGATIVE 09/04/2018 1110   LEUKOCYTESUR NEGATIVE 09/04/2018 1110     Ripudeep [name] M.D. [facility] 09/12/2018, 12:02 PM  Pager: [phone] Between 7am to 7pm - call Pager - [phone]  After 7pm go to [email or web address] - password [email or web address]  Call night coverage person covering after 7pm

## 2018-09-12 NOTE — Progress Notes (Signed)
NEPHROLOGY PROGRESS NOTE  SUBJECTIVE: no new c/o's, hoping to go home soon  OBJECTIVE:  Vitals:   09/12/18 1003 09/12/18 1159  BP: (!) 143/74 137/81  Pulse: (!) 112 (!) 113  Resp: (!) 21 (!) 23  Temp:  (!) 97.5 F (36.4 C)  SpO2: 93% 93%    Intake/Output Summary (Last 24 hours) at 09/12/2018 1312 Last data filed at 09/12/2018 1007 Gross per 24 hour  Intake 456 ml  Output 1825 ml  Net -1369 ml      Genearl:  AAOx3 NAD HEENT: MMM [place] AT anicteric sclera Neck:  No JVD, no adenopathy CV:  Heart RRR  Lungs:  L/S CTA bilaterally Abd:  abd SNT/ND with normal BS GU:  Bladder non-palpable Extremities:  No LE edema. Skin:  No skin rash  MEDICATIONS:  . amLODipine  5 mg Oral Daily  . cholecalciferol  1,000 Units Oral Daily  . demeclocycline  150 mg Oral Q12H  . enoxaparin (LOVENOX) injection  1 mg/kg Subcutaneous Q24H  . feeding supplement (ENSURE ENLIVE)  237 mL Oral BID BM  . insulin aspart  0-5 Units Subcutaneous QHS  . insulin aspart  0-9 Units Subcutaneous TID WC  . insulin aspart  3 Units Subcutaneous TID WC  . [START ON 09/13/2018] insulin glargine  7 Units Subcutaneous Daily  . latanoprost  1 drop Both Eyes QHS  . levofloxacin  750 mg Oral Q48H  . OLANZapine  2.5 mg Oral QHS  . sodium chloride  2 g Oral TID WC       LABS:   CBC Latest Ref Rng & Units 09/12/2018 09/11/2018 09/10/2018  WBC 4.0 - 10.5 K/uL 12.8(H) 12.2(H) 10.8(H)  Hemoglobin 13.0 - 17.0 g/dL 13.0 13.4 13.2  Hematocrit 39.0 - 52.0 % 40.5 41.0 40.3  Platelets 150 - 400 K/uL 451(H) 506(H) 420(H)    CMP Latest Ref Rng & Units 09/12/2018 09/11/2018 09/10/2018  Glucose 70 - 99 mg/dL 169(H) 184(H) 197(H)  BUN 8 - 23 mg/dL 45(H) 40(H) 33(H)  Creatinine 0.61 - 1.24 mg/dL 1.79(H) 1.68(H) 1.47(H)  Sodium 135 - 145 mmol/L 129(L) 125(L) 124(L)  Potassium 3.5 - 5.1 mmol/L 4.3 4.5 4.4  Chloride 98 - 111 mmol/L 91(L) 87(L) 87(L)  CO2 22 - 32 mmol/L 28 27 28   Calcium 8.9 - 10.3 mg/dL 12.1(H) 11.7(H) 12.4(H)   Total Protein 6.5 - 8.1 g/dL - 6.2(L) -  Total Bilirubin 0.3 - 1.2 mg/dL - 0.8 -  Alkaline Phos 38 - 126 U/L - 48 -  AST 15 - 41 U/L - 46(H) -  ALT 0 - 44 U/L - 117(H) -    Lab Results  Component Value Date   PTH 6 (L) 09/10/2018   PTH Comment 09/10/2018   CALCIUM 12.1 (H) 09/12/2018       Component Value Date/Time   COLORURINE YELLOW 09/04/2018 1110   APPEARANCEUR CLEAR 09/04/2018 1110   LABSPEC 1.011 09/04/2018 1110   PHURINE 7.0 09/04/2018 1110   GLUCOSEU NEGATIVE 09/04/2018 1110   HGBUR NEGATIVE 09/04/2018 [place] 09/04/2018 1110   [place] 09/04/2018 1110   PROTEINUR NEGATIVE 09/04/2018 1110   NITRITE NEGATIVE 09/04/2018 1110   LEUKOCYTESUR NEGATIVE 09/04/2018 1110   No results found for: PHART, PCO2ART, PO2ART, HCO3, TCO2, ACIDBASEDEF, O2SAT  No results found for: IRON, TIBC, FERRITIN, IRONPCTSAT     ASSESSMENT/PLAN:     1.  Severe hyponatremia: euvolemic, possibly meds (HCTZ/ zoloft) but more likely SIADH due to malignancy. Na+ has improved to  129 today. Didn't tolerate lasix due to [place].  Would rec dc home on the following> - salt tab taper 2mg  bid x 3d then 2gm qd x 3d then stop - continue demeclocycline 150 bid to be followed by PCP and dc'd if Na+ levels get too high (> 138)  - He has good OP PCP f/u -  Hydrochlorothiazide is contraindicated for future use - will sign off   2.  Chronic kidney disease stage III.  Baseline serum creatinine appears to be in the 1.6 range based on prior labs. Creat remains in the 1.4- 1.8 range here.   3.  Metastatic renal cancer: CT scan of chest here is showing disease progression.  F/b Dr Julien Nordmann. Was started on palliative immunoRx recently. Has mostly lung disease dx'd in Dec 2019 (initial Dx and nephrectomy were in 2008).   4.  Acute pulm embolus: CTA chest +for PE, was on IV heparin now on full-dose Lovenox.   5.  Hypercalcemia: pth low consistent with non parathyroid hyperCa++.  No bony  mets on recent PET though, could be just some immobility too. Supportive care.     [facility] 09/12/2018, 1:12 PM

## 2018-09-12 NOTE — Progress Notes (Signed)
Physical Therapy Treatment Patient Details Name: Blake Hines MRN: [id number] DOB: 03-03-1931 Today's Date: 09/12/2018    History of Present Illness Patient is 85 year old male with metastatic renal cell carcinoma currently undergoing palliative immunotherapy treatment.  Pt admitted with hyponatremia.    PT Comments    Patient with decreased activity tolerance and increased time to recover when attempting ambulation on RA for qualifying O2 sats.  Feel he may need higher level of care if decline continues.  If d/c home will need home equipment.  PT to follow.    Follow Up Recommendations  Home health PT;Supervision for mobility/OOB     Equipment Recommendations  Rolling walker with 5" wheels;Hospital bed;Wheelchair (measurements PT);3in1 (PT)    Recommendations for Other Services       Precautions / Restrictions Precautions Precautions: Fall Precaution Comments: monitor o2 sats and HR    Mobility  Bed Mobility Overal bed mobility: Needs Assistance Bed Mobility: Supine to Sit     Supine to sit: Min assist;Mod assist     General bed mobility comments: increased time up from supine with elevated HOB, cues and guiding help to get legs off EOB and to lift trunk with rail  Transfers Overall transfer level: Needs assistance Equipment used: Rolling walker (2 wheeled) Transfers: Sit to/from Stand Sit to Stand: Min assist         General transfer comment: help to stand for balance and initiation  Ambulation/Gait Ambulation/Gait assistance: Min assist Gait Distance (Feet): 40 Feet(x 2) Assistive device: Rolling walker (2 wheeled) Gait Pattern/deviations: Step-through pattern;Decreased stride length;Wide base of support     General Gait Details: family in room and state pt needs to walk without O2; needed min A for balance/safety with RW and noted dyspnea with SpO2 84%, HR 141 with ambulation.  Placed on 2 then 3 then 4 L O2 with SpO2 up to 88% at [employer] Mobility    Modified Rankin (Stroke Patients Only)       Balance Overall balance assessment: Needs assistance   Sitting balance-Leahy Scale: Good     Standing balance support: Bilateral upper extremity supported Standing balance-Leahy Scale: Poor Standing balance comment: UE support for balance                            Cognition Arousal/Alertness: Awake/alert Behavior During Therapy: Flat affect Overall Cognitive Status: Impaired/Different from baseline Area of Impairment: Problem solving                             Problem Solving: Slow processing;Decreased initiation;Requires verbal cues;Requires tactile cues        Exercises      General Comments General comments (skin integrity, edema, etc.): Discussed with family options for d/c report hope to get palliative services after d/c as not yet ready for Hospice care.  report they are considering ALF or home at this time      Pertinent Vitals/Pain Pain Assessment: No/denies pain    Home Living                      Prior Function            PT Goals (current goals can now be found in the care plan section) Progress towards PT goals: Not progressing toward goals - comment(less tolerance  to activity)    Frequency    Min 3X/week      PT Plan Current plan remains appropriate;Equipment recommendations need to be updated    Co-evaluation              AM-PAC PT "6 Clicks" Mobility   Outcome Measure  Help needed turning from your back to your side while in a flat bed without using bedrails?: A Little Help needed moving from lying on your back to sitting on the side of a flat bed without using bedrails?: A Little Help needed moving to and from a bed to a chair (including a wheelchair)?: A Little Help needed standing up from a chair using your arms (e.g., wheelchair or bedside chair)?: A Little Help needed to walk in hospital room?: A Little Help  needed climbing 3-5 steps with a railing? : Total 6 Click Score: 16    End of Session Equipment Utilized During Treatment: Gait belt;Oxygen Activity Tolerance: Patient limited by fatigue Patient left: in chair;with family/visitor present   PT Visit Diagnosis: Difficulty in walking, not elsewhere classified (R26.2);Muscle weakness (generalized) (M62.81)     Time: 4497-5300 PT Time Calculation (min) (ACUTE ONLY): 27 min  Charges:  $Gait Training: 8-22 mins $Therapeutic Activity: 8-22 mins                     Magda Kiel, [place] [facility] [phone] 09/12/2018    Reginia Naas 09/12/2018, 3:16 PM

## 2018-09-12 NOTE — Care Management Note (Signed)
Case Management Note  Patient Details  Name: Blake Hines MRN: [id number] Date of Birth: [birth year]  Subjective/Objective:                  Discharge Readiness Return to top of Pneumonia RRG - [place]  Discharge readiness is indicated by patient meeting Recovery Milestones, including ALL of the following: ? Hemodynamic stability YES  ? Tachypnea absent YES ? Hypoxemia absent ON O2 VIA NASAL CANNUAL ? Afebrile, or temperature acceptable for next level of care YES ? Oxygen absent or at baseline need NO ? Mental status at baseline YES ? Antibiotic regimen acceptable for next level of care  YES ? Ambulatory YES ? Oral hydration, medications, and diet  ? IV AREDIA FOR PAGDET'S DZ, ? READY FOR DC WHEN IV AREDIA FINISHED   Action/Plan: Will follow for progression of care and clinical status. Will follow for case management needs none present at this time.  Expected Discharge Date:  (unknown)               Expected Discharge Plan:     In-House Referral:     Discharge planning Services     Post Acute Care Choice:    Choice offered to:     DME Arranged:    DME Agency:     HH Arranged:    HH Agency:     Status of Service:     If discussed at [employer], dates discussed:    Additional Comments:  Leeroy Cha, RN 09/12/2018, 1:23 PM

## 2018-09-12 NOTE — Progress Notes (Signed)
ANTICOAGULATION CONSULT NOTE - Follow Up Consult  Pharmacy Consult for full dose Lovenox Indication: pulmonary embolus  No Known Allergies  Patient Measurements: Height: 5\' 8"  (172.7 cm) Weight: 166 lb 0.1 oz (75.3 kg) IBW/kg (Calculated) : 68.4 Heparin Dosing Weight: actual  Vital Signs: Temp: 97.6 F (36.4 C) (02/12 0511) Temp Source: Oral (02/12 0511) BP: 137/91 (02/12 0511) Pulse Rate: 118 (02/12 0511)  Labs: Recent Labs    09/09/18 1649  09/10/18 0626 09/11/18 0600 09/12/18 0609 09/12/18 0629  HGB  --    < > 13.2 13.4 13.0  --   HCT  --   --  40.3 41.0 40.5  --   PLT  --   --  420* 506* 451*  --   HEPARINUNFRC 0.45  --  0.45  --   --   --   CREATININE  --    < > 1.47* 1.68*  --  1.79*   < > = values in this interval not displayed.    Estimated Creatinine Clearance: 29.2 mL/min (A) (by C-G formula based on SCr of 1.79 mg/dL (H)).   Medical History: Past Medical History:  Diagnosis Date  . Bipolar 1 disorder ([place])   . BPH (benign prostatic hyperplasia)   . Cancer ([place])    RCC  . Cataract   . Chronic kidney disease   . COPD (chronic obstructive pulmonary disease) ([place])   . Depression   . Diabetes mellitus without complication ([place])   . Hypertension     Medications:  Scheduled:  . amLODipine  5 mg Oral Daily  . cholecalciferol  1,000 Units Oral Daily  . demeclocycline  150 mg Oral Q12H  . enoxaparin (LOVENOX) injection  1 mg/kg Subcutaneous Q12H  . feeding supplement (ENSURE ENLIVE)  237 mL Oral BID BM  . insulin aspart  0-5 Units Subcutaneous QHS  . insulin aspart  0-9 Units Subcutaneous TID WC  . insulin aspart  2 Units Subcutaneous TID WC  . insulin glargine  5 Units Subcutaneous Daily  . latanoprost  1 drop Both Eyes QHS  . levofloxacin  750 mg Oral Q48H  . OLANZapine  2.5 mg Oral QHS  . sodium chloride  2 g Oral TID WC   Infusions:    Assessment: Blake Hines admitted 2/4 with generalized weakness, hx of metastatic renal cell carcinoma now  with elevated d-dimer and intermediate probability for PE per perfusion lung scan. Dopplers to rule out DVT. CTa ordered.  Today, 09/12/2018  CBC stable  SCr rising now 1.79 with est CrCl of 29 ml/min  Lovenox doses being charted appropriately as ordered  Goal of Therapy:  Heparin level 0.3-0.7 units/ml Monitor platelets by anticoagulation protocol: Yes   Plan:  1) Due to rise in SCr and est CrCl now < 30 ml/min, will change Lovenox from 1mg /kg SQ q12 to 1mg /kg SQ q24 2) Continue to monitor renal function, CBC and signs/sx's of bleeding   Adrian Saran, PharmD, BCPS 09/12/2018 7:31 AM

## 2018-09-13 ENCOUNTER — Inpatient Hospital Stay: Payer: Medicare Other

## 2018-09-13 DIAGNOSIS — C7972 Secondary malignant neoplasm of left adrenal gland: Secondary | ICD-10-CM

## 2018-09-13 LAB — GLUCOSE, CAPILLARY
GLUCOSE-CAPILLARY: 224 mg/dL — AB (ref 70–99)
Glucose-Capillary: 229 mg/dL — ABNORMAL HIGH (ref 70–99)
Glucose-Capillary: 191 mg/dL — ABNORMAL HIGH (ref 70–99)
Glucose-Capillary: 160 mg/dL — ABNORMAL HIGH (ref 70–99)
Glucose-Capillary: 241 mg/dL — ABNORMAL HIGH (ref 70–99)

## 2018-09-13 LAB — BASIC METABOLIC PANEL
Anion gap: 9 (ref 5–15)
BUN: 51 mg/dL — ABNORMAL HIGH (ref 8–23)
CO2: 27 mmol/L (ref 22–32)
Calcium: 13.4 mg/dL (ref 8.9–10.3)
Chloride: 95 mmol/L — ABNORMAL LOW (ref 98–111)
Creatinine, Ser: 1.94 mg/dL — ABNORMAL HIGH (ref 0.61–1.24)
GFR calc Af Amer: 36 mL/min — ABNORMAL LOW (ref >60)
GFR, EST NON AFRICAN AMERICAN: 31 mL/min — AB (ref >60)
GLUCOSE: 195 mg/dL — AB (ref 70–99)
Potassium: 4.6 mmol/L (ref 3.5–5.1)
Sodium: 131 mmol/L — ABNORMAL LOW (ref 135–145)

## 2018-09-13 LAB — CULTURE, BLOOD (ROUTINE X 2)
Culture: NO GROWTH
Culture: NO GROWTH
Special Requests: ADEQUATE
Special Requests: ADEQUATE

## 2018-09-13 LAB — CBC
HCT: 40.7 % (ref 39.0–52.0)
Hemoglobin: 13.1 g/dL (ref 13.0–17.0)
MCH: 27.6 pg (ref 26.0–34.0)
MCHC: 32.2 g/dL (ref 30.0–36.0)
MCV: 85.9 fL (ref 80.0–100.0)
Platelets: 422 10*3/uL — ABNORMAL HIGH (ref 150–400)
RBC: 4.74 MIL/uL (ref 4.22–5.81)
RDW: 12.6 % (ref 11.5–15.5)
WBC: 12.9 10*3/uL — ABNORMAL HIGH (ref 4.0–10.5)
nRBC: 0 % (ref 0.0–0.2)

## 2018-09-13 LAB — ALBUMIN: Albumin: 2.9 g/dL — ABNORMAL LOW (ref 3.5–5.0)

## 2018-09-13 LAB — MAGNESIUM: Magnesium: 1.9 mg/dL (ref 1.7–2.4)

## 2018-09-13 MED ORDER — CALCITONIN (SALMON) 200 UNIT/ML IJ SOLN
4.0000 [IU]/kg | Freq: Two times a day (BID) | INTRAMUSCULAR | Status: AC
  Administered 2018-09-13 – 2018-09-14 (×4): 312 [IU] via SUBCUTANEOUS
  Filled 2018-09-13 (×4): qty 1.56

## 2018-09-13 MED ORDER — SODIUM CHLORIDE 0.9 % IV SOLN: 90.0000 mg | Freq: Once | INTRAVENOUS | Status: DC

## 2018-09-13 MED ORDER — TUBERCULIN PPD 5 UNIT/0.1ML ID SOLN
5.0000 [IU] | Freq: Once | INTRADERMAL | Status: DC
  Filled 2018-09-13: qty 0.1

## 2018-09-13 MED ORDER — SENNOSIDES-DOCUSATE SODIUM 8.6-50 MG PO TABS
1.0000 | ORAL_TABLET | Freq: Two times a day (BID) | ORAL | Status: DC
  Administered 2018-09-13 – 2018-09-15 (×5): 1 via ORAL
  Filled 2018-09-13 (×5): qty 1

## 2018-09-13 MED ORDER — POLYETHYLENE GLYCOL 3350 17 G PO PACK: 17.0000 g | PACK | Freq: Every day | ORAL | Status: DC | PRN

## 2018-09-13 NOTE — Progress Notes (Signed)
[facility]                                                                              Patient Demographics  Blake Hines, is a 85 y.o. male, DOB - 09-Dec-1930, XLK:[id number]  Admit date - 09/04/2018   Admitting Physician Georgette Shell, MD  Outpatient Primary MD for the patient is Crist Infante, MD  Outpatient specialists:   LOS - 9  days   Medical records reviewed and are as summarized below:    Chief Complaint  Patient presents with  . abnormal labs  . Cancer    Renal with mets  . Shortness of Breath       Brief summary   85 year old male with PMH of metastatic renal cell carcinoma, status post left nephrectomy, on immunotherapy, last cycle approximately 3 weeks ago, stage III CKD, COPD, DM 2, HTN, bipolar disorder, presented with ongoing low-grade fever with chills and mild dyspnea at home, decreased appetite and progressive generalized weakness to an extent where he was unable to do his daily activities.  In ED sodium 124, creatinine 1.63.  -Patient was admitted for dehydration with hyponatremia, acute on chronic CKD Hydrated initially with IV saline, dehydration resolved but then his hyponatremia worsened. Work-up suggested SIADH, fluid restricted, oral Lasix low-dose initiated, sodium improved to 125 but then again worsened to 120. Patient has dyspnea, hypoxia, persistent sinus tachycardia on telemetry, reported 2 episodes of coughing up small blood clots prior to admission, relatively nonambulatory, underlying cancer diagnosis, concern for pulmonary embolism. D-dimer positive, VQ scan intermediate study, lower extremity venous Dopplers negative for DVT, TTE without right heart strain. CTA chest 2/8 confirmed acute PE, possible multifocal pneumonia, superimposed edema, increased mediastinal and hilar adenopathy, pulmonary metastasis.IV heparin transitioned to full dose Lovenox 2/10.  Palliative consulted for goals of care.    Assessment & Plan      Principal Problem: Dehydration with hyponatremia -Likely due to poor oral intake, recent immunotherapy, ongoing HCTZ use, Zoloft -Both HCTZ and Zoloft discontinued, patient was placed on IV fluid hydration, dehydration resolved.   -Continue fluid restriction, NaCl tabs, 2 g 3 times daily, demeclocycline 150 mg twice daily -Nephrology signed off, discussed with Dr. Jonnie Finner this morning, no changes with the above regimen in the light of hypercalcemia, recommended to discuss with oncology   Active problems: Hyponatremia/suspected SIADH  -At baseline, sodium in mid 120s, at the time of admission sodium 124 however dropped down to 120 with IV saline.  - HCTZ and Zoloft has been discontinued -Sodium 131, no mental status changes -Continue fluid restriction, NaCl tabs, demeclocycline.   Acute respiratory failure with hypoxia  secondary to acute pulmonary embolism -Dyspnea with hypoxia and small hemoptysis 2 episodes prior to admission, has underlying metastatic renal CA -CT angiogram 2/8 confirmed acute pulmonary embolism with multifocal pneumonia and superimposed edema.  Lower extremity Dopplers negative for DVT. -2D echo showed no right heart strain. -Initially treated with IV heparin primary oncologist, Dr. Julien Nordmann on 2/10 who recommended full dose Lovenox.  Patient was agreeable, continue full dose Lovenox. -Home O2 evaluation done, will need 4 L O2 upon discharge  Multifocal pneumonia -Seen  on CTA chest, continue levofloxacin day #6/7  Hypercalcemia -Corrected calcium 14.3 with albumin of 1.9, has been trending up, status post 1 dose of Aredia on 2/12 -PTH low consistent with non-parathyroid hypercalcemia, no bony mets on recent PET -Unable to give IV fluids due to SIADH/hyponatremia, discussed with nephrology, recommended calcitonin and no IV fluids or Lasix  Acute on chronic CKD stage III -Baseline creatinine 1.6, presented with creatinine of 1.9 -Creatinine was improving,  trending up to 1.9 likely due to hypercalcemia, patient also received contrast for CT angiogram. -Discussed with nephrology, will treat hypercalcemia with calcitonin, recommended no IV fluids or Lasix, overall poor prognosis   Diabetes mellitus type 2 with renal complications -Continue sliding scale insulin, continue Lantus, increase to 7units daily -Increase NovoLog meal coverage to 3 units 3 times daily AC  Essential hypertension -HCTZ discontinued indefinitely, losartan held due to SIADH -Continue amlodipine, BP improving  COPD Currently stable, no wheezing   Hyperlipidemia Continue Tri[facility]  History of bipolar disorder Currently stable, continue Zyprexa  Abnormal LFTs -Improving, unclear etiology, possibly due to immunotherapy  Metastatic renal cell CA - CTA chest shows pulmonary nodules, increasing mediastinal and hilar adenopathy.  -Palliative consulted, per patient and daughter, full scope of treatment -Discussed with Dr. Julien Nordmann today, will follow for further recommendations   Sinus tachycardia -Likely due to acute pulmonary embolism, TSH> 10, free T4N T3 normal possibly sick euthyroid syndrome -Follow thyroid studies in 4 to 6 weeks.  Code Status: Full code DVT Prophylaxis: Lovenox Family Communication: Discussed in detail with the patient, all imaging results, lab results explained to the patient and daughter   Disposition Plan: PT evaluation recommended home health PT.  Patient wants to go home and then transition to ALF  Time Spent in minutes   35 minutes  Procedures:  2D echo: EF 50 to 55%, right ventricle normal systolic function  Consultants:   Pulmonology Nephrology  Antimicrobials:   Anti-infectives (From admission, onward)   Start     Dose/Rate Route Frequency Ordered Stop   09/11/18 2200  demeclocycline (DECLOMYCIN) tablet 150 mg     150 mg Oral Every 12 hours 09/11/18 1614     09/10/18 1000  levofloxacin (LEVAQUIN) tablet 750 mg     750  mg Oral Every 48 hours 09/09/18 1444     09/08/18 1030  levofloxacin (LEVAQUIN) IVPB 750 mg  Status:  Discontinued     750 mg 100 mL/hr over 90 Minutes Intravenous Every 48 hours 09/08/18 0940 09/09/18 1118         Medications  Scheduled Meds: . amLODipine  5 mg Oral Daily  . calcitonin  4 Units/kg Subcutaneous BID  . cholecalciferol  1,000 Units Oral Daily  . demeclocycline  150 mg Oral Q12H  . enoxaparin (LOVENOX) injection  1 mg/kg Subcutaneous Q24H  . feeding supplement (ENSURE ENLIVE)  237 mL Oral BID BM  . insulin aspart  0-5 Units Subcutaneous QHS  . insulin aspart  0-9 Units Subcutaneous TID WC  . insulin aspart  3 Units Subcutaneous TID WC  . insulin glargine  7 Units Subcutaneous Daily  . latanoprost  1 drop Both Eyes QHS  . levofloxacin  750 mg Oral Q48H  . OLANZapine  2.5 mg Oral QHS  . sodium chloride  2 g Oral TID WC   Continuous Infusions: PRN Meds:.acetaminophen, hydrALAZINE      Subjective:   Nishaan Stanke was seen and examined today.  Patient denies any new complaints, no chest pain or shortness  of breath, nausea vomiting or abdominal pain.  No fevers no acute issues overnight.  Objective:   Vitals:   09/12/18 1409 09/12/18 1838 09/12/18 2046 09/13/18 0518  BP: 131/72 139/63 (!) 141/69 (!) 161/91  Pulse: (!) 126 (!) 121 (!) 124 (!) 110  Resp: (!) 23 (!) 23 20 18   Temp:  98.4 F (36.9 C) 98.2 F (36.8 C) 98.6 F (37 C)  TempSrc:  Oral Oral Oral  SpO2: 92% 92% 94% 96%  Weight:    78 kg  Height:        Intake/Output Summary (Last 24 hours) at 09/13/2018 1326 Last data filed at 09/13/2018 1200 Gross per 24 hour  Intake 1187.42 ml  Output 1800 ml  Net -612.58 ml     Wt Readings from Last 3 Encounters:  09/13/18 78 kg  08/16/18 80.6 kg  07/26/18 82.8 kg   Physical Exam  General: Alert and oriented x 3, NAD  Eyes:   HEENT:    Cardiovascular: S1 S2 clear, RRR. No pedal edema b/l  Respiratory: CTAB, no wheezing, rales or  rhonchi  Gastrointestinal: Soft, NT, ND, NBS  Ext: no pedal edema bilaterally  Neuro: no new deficits  Musculoskeletal: No cyanosis, clubbing  Skin: No rashes  Psych: Normal affect and demeanor, alert and oriented x3    Data Reviewed:  I have personally reviewed following labs and imaging studies  Micro Results Recent Results (from the past 240 hour(s))  Blood culture (routine x 2)     Status: None   Collection Time: 09/04/18 10:39 AM  Result Value Ref Range Status   Specimen Description   Final    BLOOD BLOOD LEFT FOREARM Performed at [facility] [address], [place], [place] [address]    Special Requests   Final    BOTTLES DRAWN AEROBIC AND ANAEROBIC Blood Culture results may not be optimal due to an excessive volume of blood received in culture bottles Performed at [facility] [address], [place], [place] [address]    Culture   Final    NO GROWTH 5 DAYS Performed at [facility], [place] [address], [place], [place] [address]    Report Status 09/09/2018 FINAL  Final  Urine culture     Status: None   Collection Time: 09/04/18 11:10 AM  Result Value Ref Range Status   Specimen Description   Final    URINE, RANDOM Performed at [facility] [address] [place], [place] [address]    Special Requests   Final    NONE Performed at [facility], [place] [address], [place], [place] [address]    Culture   Final    NO GROWTH Performed at [facility], [place] [address] [place], [place] [address]    Report Status 09/05/2018 FINAL  Final  Blood culture (routine x 2)     Status: None   Collection Time: 09/04/18 11:53 AM  Result Value Ref Range Status   Specimen Description   Final    BLOOD LEFT ANTECUBITAL Performed at [facility] [address], [place], [place] [address]    Special Requests   Final    BOTTLES DRAWN AEROBIC AND ANAEROBIC Blood Culture  adequate volume Performed at [facility] [address], [place], [place] [address]    Culture   Final    NO GROWTH 5 DAYS Performed at [facility], [place],  [place] [address]    Report Status 09/09/2018 FINAL  Final  Culture, blood (Routine X 2) w Reflex to ID Panel     Status: None   Collection Time: 09/08/18  9:59 AM  Result Value Ref Range Status   Specimen Description   Final    BLOOD LEFT ANTECUBITAL Performed at [facility] [address], [place], [place] [address]    Special Requests   Final    BOTTLES DRAWN AEROBIC ONLY Blood Culture adequate volume Performed at [facility] [address], [place], [place] [address]    Culture   Final    NO GROWTH 5 DAYS Performed at [facility], [place] [address] [place], [place] [address]    Report Status 09/13/2018 FINAL  Final  Culture, blood (Routine X 2) w Reflex to ID Panel     Status: None   Collection Time: 09/08/18 10:06 AM  Result Value Ref Range Status   Specimen Description   Final    BLOOD LEFT ARM Performed at [facility] [address], [place], [place] [address]    Special Requests   Final    BOTTLES DRAWN AEROBIC AND ANAEROBIC Blood Culture adequate volume Performed at [facility] [address] [place], [place] [address]    Culture   Final    NO GROWTH 5 DAYS Performed at [facility], [place] [address] [place],  [address]    Report Status 09/13/2018 FINAL  Final    Radiology Reports Dg Chest 2 View  Result Date: 09/04/2018 CLINICAL DATA:  Shortness of breath EXAM: CHEST - 2 VIEW COMPARISON:  08/03/2018 FINDINGS: Patchy bilateral lower lobe airspace opacities are new since prior study concerning for pneumonia. Heart is normal size. No effusions or pneumothorax. No acute bony abnormality. IMPRESSION: Patchy bilateral lower lobe airspace opacities concerning for  pneumonia. Electronically Signed   By: Rolm Baptise M.D.   On: 09/04/2018 10:24   Ct Angio Chest Pe W Or Wo Contrast  Result Date: 09/08/2018 CLINICAL DATA:  History of metastatic renal cell carcinoma. EXAM: CT ANGIOGRAPHY CHEST WITH CONTRAST TECHNIQUE: Multidetector CT imaging of the chest was performed using the standard protocol during bolus administration of intravenous contrast. Multiplanar CT image reconstructions and MIPs were obtained to evaluate the vascular anatomy. CONTRAST:  68mL ISOVUE-370 IOPAMIDOL (ISOVUE-370) INJECTION 76% COMPARISON:  PET-CT 08/10/2018 FINDINGS: Cardiovascular: Normal heart size. Small pericardial effusion. Aorta and main pulmonary artery normal in caliber. Thoracic aortic vascular calcifications. Nonocclusive embolus demonstrated within the distal right lower lobe pulmonary artery (image 164; series 5). Additionally embolus is demonstrated within the right middle lobe segmental and subsegmental pulmonary arteries. Small amount of embolic material demonstrated within the right upper lobe pulmonary arteries. RV/LV ratio less than 1. Mediastinum/Nodes: Re demonstrated extensive mediastinal and hilar adenopathy. Slight interval increase in size of subcarinal lymph node measuring 4.7 cm (image 53; series 4), previously 4.2 cm. Similar-appearing 2.5 cm left inferior hilar lymph node (image 65; series 4), previously 2.5 cm. Interval increase in size of left hilar lymph node measuring 1.5 cm (image 53; series 4), previously 1.0 cm. Lungs/Pleura: Similar-appearing scattered pulmonary nodules. Interval development of masslike area of consolidation within the left lower lobe measuring 6.1 x 3.9 cm. Additionally there is new predominately subpleural ground-glass and consolidative opacities throughout the left lung and right lower lobe. Upper Abdomen: No acute process. Musculoskeletal: Thoracic spine degenerative changes. No aggressive or acute appearing osseous lesions identified. Review  of  the MIP images confirms the above findings. IMPRESSION: 1. Findings positive for pulmonary embolus predominately within the right middle and right lower lobes as well as right upper lobe. 2. Interval development of a large (6 cm) masslike area of consolidation within the left lower lobe. Additionally there is new patchy predominately subpleural ground-glass and consolidative opacities throughout the left lung and right lower lobe. Overall findings are concerning for multifocal infectious process. Possibility of superimposed edema not excluded. 3. Interval increase in size of mediastinal and hilar adenopathy. 4. Re demonstrated scattered pulmonary nodules compatible with known metastatic disease. 5. Critical Value/emergent results were called by telephone at the time of interpretation on 09/08/2018 at 6:54 pm to Dr. Vernell Leep , who verbally acknowledged these results. Electronically Signed   By: Lovey Newcomer M.D.   On: 09/08/2018 18:56   Nm Pulmonary Perf And Vent  Result Date: 09/07/2018 CLINICAL DATA:  Stage IV renal cell carcinoma. Elevated D-dimer. Clinical concern for pulmonary embolism. COPD. EXAM: NUCLEAR MEDICINE VENTILATION - PERFUSION LUNG SCAN TECHNIQUE: Ventilation images were obtained in multiple projections using inhaled aerosol Tc-29m DTPA. Perfusion images were obtained in multiple projections after intravenous injection of Tc-65m MAA. RADIOPHARMACEUTICALS:  30.2 mCi of Tc-63m DTPA aerosol inhalation and 4.2 mCi Tc42m MAA IV COMPARISON:  09/06/2018 chest radiograph. FINDINGS: Ventilation: Limited heterogeneous radiotracer throughout both lungs on the ventilation images, with central airway radiotracer accumulation. Perfusion: Numerous large matched segmental perfusion defects throughout both lungs, including within the lower lung zones. IMPRESSION: Intermediate probability for pulmonary embolism (20-79%) by PIOPED II criteria. Electronically Signed   By: Ilona Sorrel M.D.   On: 09/07/2018 23:23    Dg Chest Port 1 View  Result Date: 09/06/2018 CLINICAL DATA:  Shortness of breath EXAM: PORTABLE CHEST 1 VIEW COMPARISON:  Two days ago FINDINGS: Asymmetric left lower lung opacity compatible with pneumonia. Pulmonary metastatic disease based on recent PET-CT. COPD. Normal heart size. Aortic tortuosity. IMPRESSION: 1. Unchanged asymmetric left lower pulmonary opacity primarily concerning for pneumonia. 2. Pulmonary metastatic disease. Electronically Signed   By: Monte Fantasia M.D.   On: 09/06/2018 10:31   Vas [place] Lower Extremity Venous (dvt)  Result Date: 09/08/2018  Lower Venous Study Indications: Pulmonary embolism.  Performing Technologist: Abram Sander RVS  Examination Guidelines: A complete evaluation includes B-mode imaging, spectral Doppler, color Doppler, and power Doppler as needed of all accessible portions of each vessel. Bilateral testing is considered an integral part of a complete examination. Limited examinations for reoccurring indications may be performed as noted.  Right Venous Findings: +---------+---------------+---------+-----------+----------+-------+          CompressibilityPhasicitySpontaneityPropertiesSummary +---------+---------------+---------+-----------+----------+-------+ CFV      Full           Yes      Yes                          +---------+---------------+---------+-----------+----------+-------+ SFJ      Full                                                 +---------+---------------+---------+-----------+----------+-------+ FV Prox  Full                                                 +---------+---------------+---------+-----------+----------+-------+  FV Mid   Full                                                 +---------+---------------+---------+-----------+----------+-------+ FV DistalFull                                                 +---------+---------------+---------+-----------+----------+-------+ PFV      Full                                                  +---------+---------------+---------+-----------+----------+-------+ POP      Full           Yes      Yes                          +---------+---------------+---------+-----------+----------+-------+ PTV      Full                                                 +---------+---------------+---------+-----------+----------+-------+ PERO     Full                                                 +---------+---------------+---------+-----------+----------+-------+  Left Venous Findings: +---------+---------------+---------+-----------+----------+-------+          CompressibilityPhasicitySpontaneityPropertiesSummary +---------+---------------+---------+-----------+----------+-------+ CFV      Full           Yes      Yes                          +---------+---------------+---------+-----------+----------+-------+ SFJ      Full                                                 +---------+---------------+---------+-----------+----------+-------+ FV Prox  Full                                                 +---------+---------------+---------+-----------+----------+-------+ FV Mid   Full                                                 +---------+---------------+---------+-----------+----------+-------+ FV DistalFull                                                 +---------+---------------+---------+-----------+----------+-------+  PFV      Full                                                 +---------+---------------+---------+-----------+----------+-------+ POP      Full           Yes      Yes                          +---------+---------------+---------+-----------+----------+-------+ PTV      Full                                                 +---------+---------------+---------+-----------+----------+-------+ PERO     Full                                                  +---------+---------------+---------+-----------+----------+-------+    Summary: Right: There is no evidence of deep vein thrombosis in the lower extremity. No cystic structure found in the popliteal fossa. Left: There is no evidence of deep vein thrombosis in the lower extremity. No cystic structure found in the popliteal fossa.  *See table(s) above for measurements and observations. Electronically signed by Monica Martinez MD on 09/08/2018 at 10:29:12 AM.    Final     Lab Data:  CBC: Recent Labs  Lab 09/09/18 0624 09/10/18 0626 09/11/18 0600 09/12/18 0609 09/13/18 0607  WBC 11.6* 10.8* 12.2* 12.8* 12.9*  HGB 13.3 13.2 13.4 13.0 13.1  HCT 39.5 40.3 41.0 40.5 40.7  MCV 82.1 84.1 86.1 85.8 85.9  PLT 433* 420* 506* 451* 259*   Basic Metabolic Panel: Recent Labs  Lab 09/09/18 1858 09/10/18 0626 09/10/18 1424 09/11/18 0600 09/12/18 0629 09/13/18 0607  NA 124* 124*  --  125* 129* 131*  K 4.5 4.4  --  4.5 4.3 4.6  CL 88* 87*  --  87* 91* 95*  CO2 24 28  --  27 28 27   GLUCOSE 182* 197*  --  184* 169* 195*  BUN 30* 33*  --  40* 45* 51*  CREATININE 1.39* 1.47*  --  1.68* 1.79* 1.94*  CALCIUM 10.9* 11.2* 12.4* 11.7* 12.1* 13.4*  MG  --   --   --   --  1.9 1.9   GFR: Estimated Creatinine Clearance: 26.9 mL/min (A) (by C-G formula based on SCr of 1.94 mg/dL (H)). Liver Function Tests: Recent Labs  Lab 09/07/18 0609 09/11/18 0600 09/13/18 0607  AST 55* 46*  --   ALT 136* 117*  --   ALKPHOS 47 48  --   BILITOT 0.8 0.8  --   PROT 5.9* 6.2*  --   ALBUMIN 2.6* 2.8* 2.9*   No results for input(s): LIPASE, AMYLASE in the last 168 hours. No results for input(s): AMMONIA in the last 168 hours. Coagulation Profile: Recent Labs  Lab 09/08/18 0019  INR 1.19   Cardiac Enzymes: No results for input(s): CKTOTAL, CKMB, CKMBINDEX, TROPONINI in the last 168 hours. BNP (last 3 results) No results for input(s): PROBNP in the last 8760 hours. HbA1C: No results for  input(s): HGBA1C in  the last 72 hours. CBG: Recent Labs  Lab 09/12/18 1201 09/12/18 1648 09/12/18 2048 09/13/18 0750 09/13/18 1129  GLUCAP 226* 260* 229* 191* 160*   Lipid Profile: No results for input(s): CHOL, HDL, LDLCALC, TRIG, CHOLHDL, LDLDIRECT in the last 72 hours. Thyroid Function Tests: No results for input(s): TSH, T4TOTAL, FREET4, T3FREE, THYROIDAB in the last 72 hours. Anemia Panel: No results for input(s): VITAMINB12, FOLATE, FERRITIN, TIBC, IRON, RETICCTPCT in the last 72 hours. Urine analysis:    Component Value Date/Time   COLORURINE YELLOW 09/04/2018 1110   APPEARANCEUR CLEAR 09/04/2018 1110   LABSPEC 1.011 09/04/2018 1110   PHURINE 7.0 09/04/2018 1110   GLUCOSEU NEGATIVE 09/04/2018 1110   HGBUR NEGATIVE 09/04/2018 1110   BILIRUBINUR NEGATIVE 09/04/2018 1110   KETONESUR NEGATIVE 09/04/2018 1110   PROTEINUR NEGATIVE 09/04/2018 1110   NITRITE NEGATIVE 09/04/2018 1110   LEUKOCYTESUR NEGATIVE 09/04/2018 1110       M.D. [facility] 09/13/2018, 1:26 PM  Pager: [phone] Between 7am to 7pm - call Pager - [phone]  After 7pm go to [email or web address] - password [email or web address]  Call night coverage person covering after 7pm

## 2018-09-13 NOTE — Progress Notes (Signed)
Subjective: The patient is seen and examined today.  His daughter and granddaughter were at the bedside.  He is feeling fine today with no concerning complaints except for fatigue.  He was admitted for dehydration and evaluation of electrolyte imbalance including hyponatremia and hypercalcemia.  He is currently on fluid restriction and has sodium level has improved but the patient started having increasing level of serum calcium.  I was consulted for evaluation and recommendation regarding his hypercalcemia.  The patient denied having any other significant complaints today.  Objective: Vital signs in last 24 hours: Temp:  [97.7 F (36.5 C)-98.6 F (37 C)] 97.8 F (36.6 C) (02/13 1946) Pulse Rate:  [110-132] 116 (02/13 1947) Resp:  [18-24] 18 (02/13 1946) BP: (129-161)/(75-107) 130/77 (02/13 1947) SpO2:  [96 %-97 %] 96 % (02/13 1947) Weight:  [171 lb 15.3 oz (78 kg)] 171 lb 15.3 oz (78 kg) (02/13 0518)  Intake/Output from previous day: 02/12 0701 - 02/13 0700 In: 1423.4 [P.O.:936; IV Piggyback:487.4] Out: 1950 [Urine:1950] Intake/Output this shift: No intake/output data recorded.  General appearance: alert, cooperative, fatigued and no distress Resp: clear to auscultation bilaterally Cardio: regular rate and rhythm, S1, S2 normal, no murmur, click, rub or gallop GI: soft, non-tender; bowel sounds normal; no masses,  no organomegaly Extremities: extremities normal, atraumatic, no cyanosis or edema  Lab Results:  Recent Labs    09/12/18 0609 09/13/18 0607  WBC 12.8* 12.9*  HGB 13.0 13.1  HCT 40.5 40.7  PLT 451* 422*   BMET Recent Labs    09/12/18 0629 09/13/18 0607  NA 129* 131*  K 4.3 4.6  CL 91* 95*  CO2 28 27  GLUCOSE 169* 195*  BUN 45* 51*  CREATININE 1.79* 1.94*  CALCIUM 12.1* 13.4*    Studies/Results: No results found.  Medications: I have reviewed the patient's current medications.  Assessment/Plan: This is a very pleasant 85 years old white male with  metastatic renal cell carcinoma diagnosed in December 2019 presented with numerous bilateral pulmonary nodules in addition to large mediastinal lymphadenopathy and metastatic left adrenal mass.  The patient was started on immunotherapy with ipilimumab and nivolumab status post 1 cycle.  First dose of treatment was given on August 16, 2018.  He was recently admitted to the hospital with significant generalized weakness and was also found to have electrolyte imbalance with hyponatremia and hypercalcemia. For the renal cell carcinoma, his treatment is currently on hold until improvement of his condition. For the hyponatremia the patient is currently on fluid restriction in addition to demeclocycline. For the hyper calcium this is likely secondary to his recent dehydration and fluid restriction but paraneoplastic disease could not be excluded.  The patient has no metastatic bone disease seen on the most recent imaging studies with a PET scan which is usually very sensitive for bone metastasis. The patient was treated yesterday with Aredia in addition to subcutaneous calcitonin.  I expect his calcium level to start declining in the next few days.  If no improvement, will consider the patient for hydration with normal saline and mild diuresis if needed but this could worsen his hyponatremia.  Consider also discontinuing his vitamin D tablet for now. For the generalized weakness, the patient may benefit from discharge to a skilled nursing facility after stabilization of his electrolyte and general condition. Thank you for taking good care of Mr. Blake Hines, I will continue to follow up the patient with you and assist in his management on as-needed basis.   LOS: 9 days  Eilleen Kempf 09/13/2018

## 2018-09-13 NOTE — Progress Notes (Signed)
CRITICAL VALUE ALERT  Critical Value:  Ca= 13.4  Date & Time Notied:  09/13/2018 at 0735  Provider Notified: yes by pager  Orders Received/Actions taken: waiting for new order.

## 2018-09-14 LAB — BASIC METABOLIC PANEL
Anion gap: 10 (ref 5–15)
Anion gap: 7 (ref 5–15)
BUN: 54 mg/dL — ABNORMAL HIGH (ref 8–23)
BUN: 54 mg/dL — ABNORMAL HIGH (ref 8–23)
CHLORIDE: 93 mmol/L — AB (ref 98–111)
CO2: 31 mmol/L (ref 22–32)
CO2: 32 mmol/L (ref 22–32)
Calcium: 13.3 mg/dL (ref 8.9–10.3)
Calcium: 12.6 mg/dL — ABNORMAL HIGH (ref 8.9–10.3)
Chloride: 97 mmol/L — ABNORMAL LOW (ref 98–111)
Creatinine, Ser: 2.01 mg/dL — ABNORMAL HIGH (ref 0.61–1.24)
Creatinine, Ser: 2 mg/dL — ABNORMAL HIGH (ref 0.61–1.24)
GFR calc Af Amer: 34 mL/min — ABNORMAL LOW (ref >60)
GFR calc non Af Amer: 29 mL/min — ABNORMAL LOW (ref >60)
GFR calc non Af Amer: 30 mL/min — ABNORMAL LOW (ref >60)
GFR, EST AFRICAN AMERICAN: 34 mL/min — AB (ref >60)
Glucose, Bld: 212 mg/dL — ABNORMAL HIGH (ref 70–99)
Glucose, Bld: 169 mg/dL — ABNORMAL HIGH (ref 70–99)
Potassium: 4.5 mmol/L (ref 3.5–5.1)
Potassium: 4.6 mmol/L (ref 3.5–5.1)
SODIUM: 136 mmol/L (ref 135–145)
Sodium: 134 mmol/L — ABNORMAL LOW (ref 135–145)

## 2018-09-14 LAB — GLUCOSE, CAPILLARY
Glucose-Capillary: 186 mg/dL — ABNORMAL HIGH (ref 70–99)
Glucose-Capillary: 152 mg/dL — ABNORMAL HIGH (ref 70–99)
Glucose-Capillary: 145 mg/dL — ABNORMAL HIGH (ref 70–99)
Glucose-Capillary: 208 mg/dL — ABNORMAL HIGH (ref 70–99)

## 2018-09-14 MED ORDER — SODIUM CHLORIDE 0.9 % IV BOLUS
1000.0000 mL | Freq: Once | INTRAVENOUS | Status: AC
  Administered 2018-09-14: 1000 mL via INTRAVENOUS

## 2018-09-14 MED ORDER — METOPROLOL TARTRATE 12.5 MG HALF TABLET
12.5000 mg | ORAL_TABLET | Freq: Two times a day (BID) | ORAL | Status: DC
  Administered 2018-09-14 – 2018-09-15 (×3): 12.5 mg via ORAL
  Filled 2018-09-14 (×3): qty 1

## 2018-09-14 MED ORDER — SODIUM CHLORIDE 1 G PO TABS
1.0000 g | ORAL_TABLET | Freq: Three times a day (TID) | ORAL | Status: DC
  Administered 2018-09-14: 1 g via ORAL
  Filled 2018-09-14: qty 1

## 2018-09-14 NOTE — Progress Notes (Addendum)
[facility]                                                                              Patient Demographics  Blake Hines, is a 85 y.o. male, DOB - [birth year], RWE:[id number]  Admit date - 09/04/2018   Admitting Physician Georgette Shell, MD  Outpatient Primary MD for the patient is Crist Infante, MD  Outpatient specialists:   LOS - 10  days   Medical records reviewed and are as summarized below:    Chief Complaint  Patient presents with  . abnormal labs  . Cancer    Renal with mets  . Shortness of Breath       Brief summary   85 year old male with PMH of metastatic renal cell carcinoma, status post left nephrectomy, on immunotherapy, last cycle approximately 3 weeks ago, stage III CKD, COPD, DM 2, HTN, bipolar disorder, presented with ongoing low-grade fever with chills and mild dyspnea at home, decreased appetite and progressive generalized weakness to an extent where he was unable to do his daily activities.  In ED sodium 124, creatinine 1.63.  -Patient was admitted for dehydration with hyponatremia, acute on chronic CKD Hydrated initially with IV saline, dehydration resolved but then his hyponatremia worsened. Work-up suggested SIADH, fluid restricted, oral Lasix low-dose initiated, sodium improved to 125 but then again worsened to 120. Patient has dyspnea, hypoxia, persistent sinus tachycardia on telemetry, reported 2 episodes of coughing up small blood clots prior to admission, relatively nonambulatory, underlying cancer diagnosis, concern for pulmonary embolism. D-dimer positive, VQ scan intermediate study, lower extremity venous Dopplers negative for DVT, TTE without right heart strain. CTA chest 2/8 confirmed acute PE, possible multifocal pneumonia, superimposed edema, increased mediastinal and hilar adenopathy, pulmonary metastasis.IV heparin transitioned to full dose Lovenox 2/10.  Palliative consulted for goals of care.    Assessment & Plan     Principal Problem: Dehydration with hyponatremia -Likely due to poor oral intake, recent immunotherapy, ongoing HCTZ use, Zoloft -Both HCTZ and Zoloft discontinued, patient was placed on IV fluid hydration, dehydration resolved.   -Continue fluid restriction, NaCl tabs, 2 g 3 times daily, demeclocycline 150 mg twice daily -Nephrology signed off, per Dr. Melvia Heaps, no changes with the above regimen in the light of hypercalcemia, recommended to discuss with oncology   Active problems: Hyponatremia/suspected SIADH  -At baseline, sodium in mid 120s, at the time of admission sodium 124 however dropped down to 120 with IV saline.  - HCTZ and Zoloft has been discontinued -Na has been improving now 136, will need to back off on fluid restriction and NaCl tabs.  Continue demeclocycline for now -decreased NaCl tabs to 1 g 3 times daily   Acute respiratory failure with hypoxia  secondary to acute pulmonary embolism -Dyspnea with hypoxia and small hemoptysis 2 episodes prior to admission, has underlying metastatic renal CA -CT angiogram 2/8 confirmed acute pulmonary embolism with multifocal pneumonia and superimposed edema.  Lower extremity Dopplers negative for DVT. -2D echo showed no right heart strain. -Initially treated with IV heparin primary oncologist, Dr. Julien Nordmann on 2/10 who recommended full dose Lovenox.  Patient was agreeable, continue full dose Lovenox. -  Home O2 evaluation done, will need 4 L O2 upon discharge  Multifocal pneumonia -Seen on CTA chest, continue levofloxacin day #6/7  Hypercalcemia -Corrected calcium 14.3 with albumin of 1.9, has been trending up, status post 1 dose of Aredia on 2/12 -PTH low consistent with non-parathyroid hypercalcemia, no bony mets on recent PET -Discontinued vitamin D - calcium improving, also gave 1 L fluid bolus today  Acute on chronic CKD stage III -Baseline creatinine 1.6, presented with creatinine of 1.9 -Creatinine was improving,  trending up to 1.9 likely due to hypercalcemia, patient also received contrast for CT angiogram. -Continue calcitonin x4 doses, decrease salt tabs, IV fluid bolus  Diabetes mellitus type 2 with renal complications -Continue sliding scale insulin, continue Lantus, increase to 7units daily -Increase NovoLog meal coverage to 3 units 3 times daily AC  Essential hypertension -HCTZ discontinued indefinitely, losartan held due to SIADH -BP somewhat elevated with tachycardia, placed on low-dose metoprolol, continue Norvasc  COPD Currently stable, no wheezing  Hyperlipidemia Continue TriCor  History of bipolar disorder Currently stable, continue Zyprexa  Abnormal LFTs -Improving, unclear etiology, possibly due to immunotherapy  Metastatic renal cell CA - CTA chest shows pulmonary nodules, increasing mediastinal and hilar adenopathy.  -Palliative consulted, per patient and daughter, full scope of treatment -Appreciate oncology recommendations   Sinus tachycardia -Likely due to acute pulmonary embolism, TSH> 10, free T4N T3 normal possibly sick euthyroid syndrome -Follow thyroid studies in 4 to 6 weeks.  Code Status: Full code DVT Prophylaxis: Lovenox Family Communication: Discussed in detail with the patient, all imaging results, lab results explained to the patient and discussed with the daughter.   Disposition Plan: PT evaluation recommended home health PT.  Patient wants to go home and then transition to ALF  Time Spent in minutes   35 minutes  Procedures:  2D echo: EF 50 to 55%, right ventricle normal systolic function  Consultants:   Pulmonology Nephrology  Antimicrobials:   Anti-infectives (From admission, onward)   Start     Dose/Rate Route Frequency Ordered Stop   09/11/18 2200  demeclocycline (DECLOMYCIN) tablet 150 mg     150 mg Oral Every 12 hours 09/11/18 1614     09/10/18 1000  levofloxacin (LEVAQUIN) tablet 750 mg     750 mg Oral Every 48 hours 09/09/18  1444 09/14/18 0935   09/08/18 1030  levofloxacin (LEVAQUIN) IVPB 750 mg  Status:  Discontinued     750 mg 100 mL/hr over 90 Minutes Intravenous Every 48 hours 09/08/18 0940 09/09/18 1118         Medications  Scheduled Meds: . amLODipine  5 mg Oral Daily  . calcitonin  4 Units/kg Subcutaneous BID  . demeclocycline  150 mg Oral Q12H  . enoxaparin (LOVENOX) injection  1 mg/kg Subcutaneous Q24H  . feeding supplement (ENSURE ENLIVE)  237 mL Oral BID BM  . insulin aspart  0-5 Units Subcutaneous QHS  . insulin aspart  0-9 Units Subcutaneous TID WC  . insulin aspart  3 Units Subcutaneous TID WC  . insulin glargine  7 Units Subcutaneous Daily  . latanoprost  1 drop Both Eyes QHS  . OLANZapine  2.5 mg Oral QHS  . senna-docusate  1 tablet Oral BID  . sodium chloride  1 g Oral TID WC  . tuberculin  5 Units Intradermal Once   Continuous Infusions: PRN Meds:.acetaminophen, hydrALAZINE, polyethylene glycol      Subjective:   Corrigan Kretschmer was seen and examined today.  No complaints per patient.  Denies any nausea vomiting, abdominal pain, chest pain.  No fevers, no acute issues overnight.    Objective:   Vitals:   09/13/18 1946 09/13/18 1947 09/14/18 0558 09/14/18 1336  BP: 129/75 130/77 (!) 151/95 (!) 142/87  Pulse: (!) 116 (!) 116 (!) 110 (!) 103  Resp: 18   18  Temp: 97.8 F (36.6 C)  98.5 F (36.9 C) 97.6 F (36.4 C)  TempSrc: Oral  Oral Axillary  SpO2: 96% 96% 99% 97%  Weight:      Height:        Intake/Output Summary (Last 24 hours) at 09/14/2018 1434 Last data filed at 09/14/2018 0500 Gross per 24 hour  Intake 120 ml  Output 1150 ml  Net -1030 ml     Wt Readings from Last 3 Encounters:  09/13/18 78 kg  08/16/18 80.6 kg  07/26/18 82.8 kg   Physical Exam  General: Alert and oriented x 3, NAD  Eyes: P  HEENT:    Cardiovascular: S1 S2 clear, tachycardia no pedal edema b/l  Respiratory: Bibasilar crackles  Gastrointestinal: Soft, nontender,  nondistended, NBS  Ext: no pedal edema bilaterally  Neuro: no new deficits  Musculoskeletal: No cyanosis, clubbing  Skin: No rashes  Psych: Normal affect and demeanor, alert and oriented x3     Data Reviewed:  I have personally reviewed following labs and imaging studies  Micro Results Recent Results (from the past 240 hour(s))  Culture, blood (Routine X 2) w Reflex to ID Panel     Status: None   Collection Time: 09/08/18  9:59 AM  Result Value Ref Range Status   Specimen Description   Final    BLOOD LEFT ANTECUBITAL Performed at [facility], [place] [address], [place], [place] [address]    Special Requests   Final    BOTTLES DRAWN AEROBIC ONLY Blood Culture adequate volume Performed at [facility] [address], [place], [place] [address]    Culture   Final    NO GROWTH 5 DAYS Performed at [facility], [place] [address], [place], [place] [address]    Report Status 09/13/2018 FINAL  Final  Culture, blood (Routine X 2) w Reflex to ID Panel     Status: None   Collection Time: 09/08/18 10:06 AM  Result Value Ref Range Status   Specimen Description   Final    BLOOD LEFT ARM Performed at [facility] [address] [place], [place] [address]    Special Requests   Final    BOTTLES DRAWN AEROBIC AND ANAEROBIC Blood Culture adequate volume Performed at [facility] [address] [place], [place] [address]    Culture   Final    NO GROWTH 5 DAYS Performed at [facility], [place] [address], [place],  [address]    Report Status 09/13/2018 FINAL  Final    Radiology Reports Dg Chest 2 View  Result Date: 09/04/2018 CLINICAL DATA:  Shortness of breath EXAM: CHEST - 2 VIEW COMPARISON:  08/03/2018 FINDINGS: Patchy bilateral lower lobe airspace opacities are new since prior study concerning for pneumonia. Heart is normal size. No effusions or pneumothorax. No acute bony abnormality.  IMPRESSION: Patchy bilateral lower lobe airspace opacities concerning for pneumonia. Electronically Signed   By: Rolm Baptise M.D.   On: 09/04/2018 10:24   Ct Angio Chest Pe W Or Wo Contrast  Result Date: 09/08/2018 CLINICAL DATA:  History of metastatic renal cell carcinoma. EXAM: CT  ANGIOGRAPHY CHEST WITH CONTRAST TECHNIQUE: Multidetector CT imaging of the chest was performed using the standard protocol during bolus administration of intravenous contrast. Multiplanar CT image reconstructions and MIPs were obtained to evaluate the vascular anatomy. CONTRAST:  24mL ISOVUE-370 IOPAMIDOL (ISOVUE-370) INJECTION 76% COMPARISON:  PET-CT 08/10/2018 FINDINGS: Cardiovascular: Normal heart size. Small pericardial effusion. Aorta and main pulmonary artery normal in caliber. Thoracic aortic vascular calcifications. Nonocclusive embolus demonstrated within the distal right lower lobe pulmonary artery (image 164; series 5). Additionally embolus is demonstrated within the right middle lobe segmental and subsegmental pulmonary arteries. Small amount of embolic material demonstrated within the right upper lobe pulmonary arteries. RV/LV ratio less than 1. Mediastinum/Nodes: Re demonstrated extensive mediastinal and hilar adenopathy. Slight interval increase in size of subcarinal lymph node measuring 4.7 cm (image 53; series 4), previously 4.2 cm. Similar-appearing 2.5 cm left inferior hilar lymph node (image 65; series 4), previously 2.5 cm. Interval increase in size of left hilar lymph node measuring 1.5 cm (image 53; series 4), previously 1.0 cm. Lungs/Pleura: Similar-appearing scattered pulmonary nodules. Interval development of masslike area of consolidation within the left lower lobe measuring 6.1 x 3.9 cm. Additionally there is new predominately subpleural ground-glass and consolidative opacities throughout the left lung and right lower lobe. Upper Abdomen: No acute process. Musculoskeletal: Thoracic spine degenerative  changes. No aggressive or acute appearing osseous lesions identified. Review of the MIP images confirms the above findings. IMPRESSION: 1. Findings positive for pulmonary embolus predominately within the right middle and right lower lobes as well as right upper lobe. 2. Interval development of a large (6 cm) masslike area of consolidation within the left lower lobe. Additionally there is new patchy predominately subpleural ground-glass and consolidative opacities throughout the left lung and right lower lobe. Overall findings are concerning for multifocal infectious process. Possibility of superimposed edema not excluded. 3. Interval increase in size of mediastinal and hilar adenopathy. 4. Re demonstrated scattered pulmonary nodules compatible with known metastatic disease. 5. Critical Value/emergent results were called by telephone at the time of interpretation on 09/08/2018 at 6:54 pm to Dr. Vernell Leep , who verbally acknowledged these results. Electronically Signed   By: Lovey Newcomer M.D.   On: 09/08/2018 18:56   Nm Pulmonary Perf And Vent  Result Date: 09/07/2018 CLINICAL DATA:  Stage IV renal cell carcinoma. Elevated D-dimer. Clinical concern for pulmonary embolism. COPD. EXAM: NUCLEAR MEDICINE VENTILATION - PERFUSION LUNG SCAN TECHNIQUE: Ventilation images were obtained in multiple projections using inhaled aerosol Tc-55m DTPA. Perfusion images were obtained in multiple projections after intravenous injection of Tc-77m MAA. RADIOPHARMACEUTICALS:  30.2 mCi of Tc-30m DTPA aerosol inhalation and 4.2 mCi Tc2m MAA IV COMPARISON:  09/06/2018 chest radiograph. FINDINGS: Ventilation: Limited heterogeneous radiotracer throughout both lungs on the ventilation images, with central airway radiotracer accumulation. Perfusion: Numerous large matched segmental perfusion defects throughout both lungs, including within the lower lung zones. IMPRESSION: Intermediate probability for pulmonary embolism (20-79%) by PIOPED II  criteria. Electronically Signed   By: Ilona Sorrel M.D.   On: 09/07/2018 23:23   Dg Chest Port 1 View  Result Date: 09/06/2018 CLINICAL DATA:  Shortness of breath EXAM: PORTABLE CHEST 1 VIEW COMPARISON:  Two days ago FINDINGS: Asymmetric left lower lung opacity compatible with pneumonia. Pulmonary metastatic disease based on recent PET-CT. COPD. Normal heart size. Aortic tortuosity. IMPRESSION: 1. Unchanged asymmetric left lower pulmonary opacity primarily concerning for pneumonia. 2. Pulmonary metastatic disease. Electronically Signed   By: Monte Fantasia M.D.   On: 09/06/2018 10:31  Vas [place] Lower Extremity Venous (dvt)  Result Date: 09/08/2018  Lower Venous Study Indications: Pulmonary embolism.  Performing Technologist: Abram Sander RVS  Examination Guidelines: A complete evaluation includes B-mode imaging, spectral Doppler, color Doppler, and power Doppler as needed of all accessible portions of each vessel. Bilateral testing is considered an integral part of a complete examination. Limited examinations for reoccurring indications may be performed as noted.  Right Venous Findings: +---------+---------------+---------+-----------+----------+-------+          CompressibilityPhasicitySpontaneityPropertiesSummary +---------+---------------+---------+-----------+----------+-------+ CFV      Full           Yes      Yes                          +---------+---------------+---------+-----------+----------+-------+ SFJ      Full                                                 +---------+---------------+---------+-----------+----------+-------+ FV Prox  Full                                                 +---------+---------------+---------+-----------+----------+-------+ FV Mid   Full                                                 +---------+---------------+---------+-----------+----------+-------+ FV DistalFull                                                  +---------+---------------+---------+-----------+----------+-------+ PFV      Full                                                 +---------+---------------+---------+-----------+----------+-------+ POP      Full           Yes      Yes                          +---------+---------------+---------+-----------+----------+-------+ PTV      Full                                                 +---------+---------------+---------+-----------+----------+-------+ PERO     Full                                                 +---------+---------------+---------+-----------+----------+-------+  Left Venous Findings: +---------+---------------+---------+-----------+----------+-------+          CompressibilityPhasicitySpontaneityPropertiesSummary +---------+---------------+---------+-----------+----------+-------+ CFV      Full  Yes      Yes                          +---------+---------------+---------+-----------+----------+-------+ SFJ      Full                                                 +---------+---------------+---------+-----------+----------+-------+ FV Prox  Full                                                 +---------+---------------+---------+-----------+----------+-------+ FV Mid   Full                                                 +---------+---------------+---------+-----------+----------+-------+ FV DistalFull                                                 +---------+---------------+---------+-----------+----------+-------+ PFV      Full                                                 +---------+---------------+---------+-----------+----------+-------+ POP      Full           Yes      Yes                          +---------+---------------+---------+-----------+----------+-------+ PTV      Full                                                  +---------+---------------+---------+-----------+----------+-------+ PERO     Full                                                 +---------+---------------+---------+-----------+----------+-------+    Summary: Right: There is no evidence of deep vein thrombosis in the lower extremity. No cystic structure found in the popliteal fossa. Left: There is no evidence of deep vein thrombosis in the lower extremity. No cystic structure found in the popliteal fossa.  *See table(s) above for measurements and observations. Electronically signed by Monica Martinez MD on 09/08/2018 at 10:29:12 AM.    Final     Lab Data:  CBC: Recent Labs  Lab 09/09/18 0624 09/10/18 0626 09/11/18 0600 09/12/18 0609 09/13/18 0607  WBC 11.6* 10.8* 12.2* 12.8* 12.9*  HGB 13.3 13.2 13.4 13.0 13.1  HCT 39.5 40.3 41.0 40.5 40.7  MCV 82.1 84.1 86.1 85.8 85.9  PLT 433* 420* 506* 451* 254*   Basic Metabolic Panel: Recent Labs  Lab  09/11/18 0600 09/12/18 0629 09/13/18 0607 09/14/18 0608 09/14/18 1317  NA 125* 129* 131* 134* 136  K 4.5 4.3 4.6 4.5 4.6  CL 87* 91* 95* 93* 97*  CO2 27 28 27 31  32  GLUCOSE 184* 169* 195* 212* 169*  BUN 40* 45* 51* 54* 54*  CREATININE 1.68* 1.79* 1.94* 2.01* 2.00*  CALCIUM 11.7* 12.1* 13.4* 13.3* 12.6*  MG  --  1.9 1.9  --   --    GFR: Estimated Creatinine Clearance: 26.1 mL/min (A) (by C-G formula based on SCr of 2 mg/dL (H)). Liver Function Tests: Recent Labs  Lab 09/11/18 0600 09/13/18 0607  AST 46*  --   ALT 117*  --   ALKPHOS 48  --   BILITOT 0.8  --   PROT 6.2*  --   ALBUMIN 2.8* 2.9*   No results for input(s): LIPASE, AMYLASE in the last 168 hours. No results for input(s): AMMONIA in the last 168 hours. Coagulation Profile: Recent Labs  Lab 09/08/18 0019  INR 1.19   Cardiac Enzymes: No results for input(s): CKTOTAL, CKMB, CKMBINDEX, TROPONINI in the last 168 hours. BNP (last 3 results) No results for input(s): PROBNP in the last 8760 hours. HbA1C: No  results for input(s): HGBA1C in the last 72 hours. CBG: Recent Labs  Lab 09/13/18 1129 09/13/18 1659 09/13/18 2234 09/14/18 0806 09/14/18 1228  GLUCAP 160* 241* 224* 186* 152*   Lipid Profile: No results for input(s): CHOL, HDL, LDLCALC, TRIG, CHOLHDL, LDLDIRECT in the last 72 hours. Thyroid Function Tests: No results for input(s): TSH, T4TOTAL, FREET4, T3FREE, THYROIDAB in the last 72 hours. Anemia Panel: No results for input(s): VITAMINB12, FOLATE, FERRITIN, TIBC, IRON, RETICCTPCT in the last 72 hours. Urine analysis:    Component Value Date/Time   COLORURINE YELLOW 09/04/2018 1110   APPEARANCEUR CLEAR 09/04/2018 1110   LABSPEC 1.011 09/04/2018 1110   PHURINE 7.0 09/04/2018 1110   GLUCOSEU NEGATIVE 09/04/2018 1110   HGBUR NEGATIVE 09/04/2018 1110   BILIRUBINUR NEGATIVE 09/04/2018 1110   KETONESUR NEGATIVE 09/04/2018 1110   PROTEINUR NEGATIVE 09/04/2018 1110   NITRITE NEGATIVE 09/04/2018 1110   LEUKOCYTESUR NEGATIVE 09/04/2018 1110     Jamaya Sleeth M.D. [facility] 09/14/2018, 2:34 PM  Pager: [phone] Between 7am to 7pm - call Pager - [phone]  After 7pm go to [email or web address] - password [email or web address]  Call night coverage person covering after 7pm

## 2018-09-14 NOTE — Progress Notes (Signed)
Physical Therapy Treatment Patient Details Name: Blake Hines MRN: [id number] DOB: [birth year] Today's Date: 09/14/2018    History of Present Illness Patient is 85 year old male with metastatic renal cell carcinoma currently undergoing palliative immunotherapy treatment.  Pt admitted with hyponatremia.    PT Comments    Pt assisted with ambulating in hallway however only able to tolerate short distance due to fatigue.  Pt continues to require supplemental oxygen and has elevated HR with activity (see mobility section).  Updated d/c recommendations to SNF as pt not progressing as anticipated and may require more assist upon d/c.    Follow Up Recommendations  SNF     Equipment Recommendations  Rolling walker with 5" wheels;Hospital bed;Wheelchair (measurements PT);3in1 (PT)    Recommendations for Other Services       Precautions / Restrictions Precautions Precautions: Fall Precaution Comments: monitor o2 sats and HR    Mobility  Bed Mobility Overal bed mobility: Needs Assistance Bed Mobility: Supine to Sit     Supine to sit: Min assist     General bed mobility comments: increased time to perform, elevated HOB, assist for raising trunk with pt utilizing rail  Transfers Overall transfer level: Needs assistance Equipment used: Rolling walker (2 wheeled) Transfers: Sit to/from Stand Sit to Stand: Min assist         General transfer comment: assist to rise and steady, verbal cues for hand placement  Ambulation/Gait Ambulation/Gait assistance: Min assist Gait Distance (Feet): 25 Feet(x2) Assistive device: Rolling walker (2 wheeled) Gait Pattern/deviations: Step-through pattern;Decreased stride length;Wide base of support     General Gait Details: pt limited by fatigue and dyspnea, Spo2 dropped to 83% on 4L O2 upon returning to room, HR in 130s during ambulation   Stairs             Wheelchair Mobility    Modified Rankin (Stroke Patients Only)        Balance                                            Cognition Arousal/Alertness: Awake/alert Behavior During Therapy: Flat affect Overall Cognitive Status: Impaired/Different from baseline Area of Impairment: Problem solving                             Problem Solving: Slow processing;Difficulty sequencing;Requires verbal cues        Exercises      General Comments        Pertinent Vitals/Pain Pain Assessment: No/denies pain    Home Living                      Prior Function            PT Goals (current goals can now be found in the care plan section) Acute Rehab PT Goals PT Goal Formulation: With patient Time For Goal Achievement: 09/28/18 Potential to Achieve Goals: Fair Progress towards PT goals: Not progressing toward goals - comment(less tolerance, fatigues quickly, requiring more oxygen)    Frequency    Min 3X/week      PT Plan Discharge plan needs to be updated    Co-evaluation              AM-PAC PT "6 Clicks" Mobility   Outcome Measure  Help needed turning from your back to your side  while in a flat bed without using bedrails?: A Little Help needed moving from lying on your back to sitting on the side of a flat bed without using bedrails?: A Little Help needed moving to and from a bed to a chair (including a wheelchair)?: A Little Help needed standing up from a chair using your arms (e.g., wheelchair or bedside chair)?: A Little Help needed to walk in hospital room?: A Little Help needed climbing 3-5 steps with a railing? : A Lot 6 Click Score: 17    End of Session Equipment Utilized During Treatment: Gait belt;Oxygen Activity Tolerance: Patient limited by fatigue Patient left: in chair;with family/visitor present;with call bell/phone within reach(no chair alarm box) Nurse Communication: Mobility status PT Visit Diagnosis: Difficulty in walking, not elsewhere classified (R26.2);Muscle weakness  (generalized) (M62.81)     Time: 8546-2703 PT Time Calculation (min) (ACUTE ONLY): 17 min  Charges:  $Gait Training: 8-22 mins                    Carmelia Bake, PT, DPT Acute Rehabilitation Services Office: [phone] Pager: [phone]  Trena Platt 09/14/2018, 1:30 PM

## 2018-09-14 NOTE — Progress Notes (Signed)
PHARMACY NOTE -  Levofloxacin  Pharmacy has been assisting with dosing of Levaquin for PNA.  Dosage remains stable at 750 mg PO q24  Pharmacy will sign off, following peripherally for culture results or dose adjustments. Please reconsult if a change in clinical status warrants re-evaluation of dosage.  Reuel Boom, PharmD, BCPS [phone] 09/14/2018, 7:55 AM

## 2018-09-14 NOTE — Care Management Important Message (Signed)
Important Message  Patient Details  Name: Blake Hines MRN: [id number] Date of Birth: 1931/03/19   Medicare Important Message Given:  Yes    Kerin Salen 09/14/2018, 12:28 PM

## 2018-09-14 NOTE — Progress Notes (Signed)
CRITICAL VALUE ALERT  Critical Value:  Calcium = 13.3  Date & Time Notied:  09/14/2018 at 0738  Provider Notified: yes  Orders Received/Actions taken: waiting for new order.

## 2018-09-14 NOTE — Progress Notes (Signed)
[place] for full dose Lovenox Indication: pulmonary embolus   Labs: Recent Labs    09/12/18 0609 09/12/18 0629 09/13/18 0607 09/14/18 0608  HGB 13.0  --  13.1  --   HCT 40.5  --  40.7  --   PLT 451*  --  422*  --   CREATININE  --  1.79* 1.94* 2.01*    Estimated Creatinine Clearance: 26 mL/min (A) (by C-G formula based on SCr of 2.01 mg/dL (H)).   Assessment: 38 yoM with PMH metaststic RCC with lung mets currently on immunotherapy admitted 2/4 with generalized weakness, dyspnea, small hemoptysis. Elevated d-dimer and VQ scan showing intermediate probability for PE. CTA confirms PE, no RH strain. Started on heparin per pharmacy, transitioned to Lovenox on 2/10.  Today, 09/14/2018  CBC stable as of 2/13  SCr continues to rise (fluid restriction d/t hypoNa)  Difficult to discern pulmonary Sx d/t overlying malignancy & PNA  Goal of Therapy: Anti-Xa level 0.6-1 units/ml 4hrs after LMWH dose given   Plan:  Continue Lovenox 80 mg SQ q24 hr with reduced renal function  Monitor for signs of bleeding or worsening thrombosis  Pharmacy will sign off, following peripherally for renal adjustments or clinical changes  Reuel Boom, PharmD, BCPS [phone] 09/14/2018, 7:48 AM

## 2018-09-15 LAB — GLUCOSE, CAPILLARY
Glucose-Capillary: 185 mg/dL — ABNORMAL HIGH (ref 70–99)
Glucose-Capillary: 195 mg/dL — ABNORMAL HIGH (ref 70–99)

## 2018-09-15 LAB — BASIC METABOLIC PANEL
Anion gap: 10 (ref 5–15)
BUN: 53 mg/dL — ABNORMAL HIGH (ref 8–23)
CO2: 30 mmol/L (ref 22–32)
Calcium: 12.9 mg/dL — ABNORMAL HIGH (ref 8.9–10.3)
Chloride: 98 mmol/L (ref 98–111)
Creatinine, Ser: 1.83 mg/dL — ABNORMAL HIGH (ref 0.61–1.24)
GFR calc Af Amer: 38 mL/min — ABNORMAL LOW (ref >60)
GFR, EST NON AFRICAN AMERICAN: 33 mL/min — AB (ref >60)
Glucose, Bld: 176 mg/dL — ABNORMAL HIGH (ref 70–99)
Potassium: 4.7 mmol/L (ref 3.5–5.1)
Sodium: 138 mmol/L (ref 135–145)

## 2018-09-15 MED ORDER — ENOXAPARIN SODIUM 80 MG/0.8ML ~~LOC~~ SOLN: 80.0000 mg | SUBCUTANEOUS | 3 refills | Status: AC

## 2018-09-15 MED ORDER — AMLODIPINE BESYLATE 5 MG PO TABS: 5.0000 mg | ORAL_TABLET | Freq: Every day | ORAL | 3 refills | Status: AC

## 2018-09-15 MED ORDER — DEMECLOCYCLINE HCL 150 MG PO TABS: 150.0000 mg | ORAL_TABLET | Freq: Two times a day (BID) | ORAL | 3 refills | Status: AC

## 2018-09-15 MED ORDER — SENNOSIDES-DOCUSATE SODIUM 8.6-50 MG PO TABS: 1.0000 | ORAL_TABLET | Freq: Two times a day (BID) | ORAL | 3 refills | Status: AC

## 2018-09-15 MED ORDER — METOPROLOL TARTRATE 25 MG PO TABS: 25.0000 mg | ORAL_TABLET | Freq: Two times a day (BID) | ORAL | Status: DC

## 2018-09-15 MED ORDER — METOPROLOL TARTRATE 25 MG PO TABS: 25.0000 mg | ORAL_TABLET | Freq: Two times a day (BID) | ORAL | 3 refills | Status: AC

## 2018-09-15 MED ORDER — SITAGLIPTIN PHOSPHATE 50 MG PO TABS: 50.0000 mg | ORAL_TABLET | ORAL | Status: AC

## 2018-09-15 NOTE — Care Management Note (Signed)
Case Management Note  Patient Details  Name: Blake Hines MRN: [id number] Date of Birth: 1931-03-17  Subjective/Objective:  Acute resp failure with hypoxia, renal cell carcinoma, lung cancer, COPD                  Action/Plan: NCM spoke to pt and dtr, [name] at bedside. Offered choice for HH/Medicare list given and placed on chart. Dtr agreeable to [facility]. Pt recommended SNF, but dtr declined. Plan is for pt to eventually go to ALF. Contacted [facility] for DME to be delivered to home.   Expected Discharge Date:  09/15/18               Expected Discharge Plan:  [place]  In-House Referral:  NA  Discharge planning Services  CM Consult  Post Acute Care Choice:  Home Health Choice offered to:  Adult Children  DME Arranged:  3-N-1, Lightweight manual wheelchair with seat cushion, Oxygen DME Agency:  [facility] Arranged:  PT, RN, Nurse's Aide, OT HH Agency:  [facility]  Status of Service:  Completed, signed off  If discussed at [place] of Stay Meetings, dates discussed:    Additional Comments:  Erenest Rasher, RN 09/15/2018, 11:54 AM

## 2018-09-15 NOTE — Progress Notes (Signed)
PHYSICAL THERAPY  SATURATION QUALIFICATIONS: (This note is used to comply with regulatory documentation for home oxygen)  Patient Saturations on Room Air at Rest = 86%  Patient Saturations on Room Air while Ambulating 10 feet  = 82% with HR 139  Patient Saturations on 4 Liters of oxygen while Ambulating = 89% Patient Saturations on 2 Liters of oxygen at rest = 90%  Please briefly explain why patient needs home oxygen:  Pt requires supplemental Oxygen during rest and activity to achieve therapeutic level  Rica Koyanagi  PTA [place] Pager      [phone] Office      [phone]

## 2018-09-15 NOTE — Progress Notes (Signed)
Awaiting [facility] to deliver needed hospital supplies to patient's home. Patient's daugher, [name], has just left with patient's belongings. Pt will leave with PTAR after oxygen is delivered to his home. Pt alert and oriented; without c/o. Discharge instructions/prescriptions given/explained to patient and his daughter, [name]. [name] will pick-up prescriptions at this time. Patient and his daughter are aware of followup appointments.

## 2018-09-15 NOTE — Progress Notes (Signed)
SATURATION QUALIFICATIONS: (This note is used to comply with regulatory documentation for home oxygen)  Patient Saturations on Room Air at Rest = 91% Pt sitting up in bed. Just finished eating. Unable to ambulate at this time due to weakness.  Patient Saturations on Room Air while Ambulating = % pt unable to ambulate  Patient Saturations on 0 Liters of oxygen while Ambulating = %  Please briefly explain why patient needs home oxygen:

## 2018-09-15 NOTE — Progress Notes (Signed)
Pt has left with PTAR, headed home. Writer called and alerted daughter, [name].

## 2018-09-15 NOTE — Progress Notes (Signed)
Physical Therapy Treatment Patient Details Name: Blake Hines MRN: [id number] DOB: Dec 22, 1930 Today's Date: 09/15/2018    History of Present Illness Patient is 85 year old male with metastatic renal cell carcinoma currently undergoing palliative immunotherapy treatment.  Pt admitted with hyponatremia.    PT Comments    Pt in bed on 2 lts.  Assisted OOB to amb while monitoring oxygen sats.  SATURATION QUALIFICATIONS: (This note is used to comply with regulatory documentation for home oxygen)  Patient Saturations on Room Air at Rest = 86%  Patient Saturations on Room Air while Ambulating 10 feet  = 82% with HR 139  Patient Saturations on 4 Liters of oxygen while Ambulating = 89% Patient Saturations on 2 Liters of oxygen at rest = 90%  Please briefly explain why patient needs home oxygen:  Pt requires supplemental Oxygen during rest and activity to achieve therapeutic level  Pt plans to D/C to home today via PTAR with family support 24/7    Follow Up [facility] since family declines SNF as rec by evaluating LPT     Equipment Recommendations  Rolling walker with 5" wheels;Hospital bed;3in1 (PT)(daughter stated they can barrow a wheelchair)    Recommendations for Other Services       Precautions / Restrictions Precautions Precautions: Fall Precaution Comments: monitor o2 sats and HR, HOH Restrictions Weight Bearing Restrictions: No    Mobility  Bed Mobility Overal bed mobility: Needs Assistance Bed Mobility: Supine to Sit     Supine to sit: Min guard;Min assist     General bed mobility comments: increased time to perform, elevated HOB, assist for raising trunk with pt utilizing rail  Transfers Overall transfer level: Needs assistance Equipment used: Rolling walker (2 wheeled) Transfers: Sit to/from Stand Sit to Stand: Min assist         General transfer comment: assist to rise and steady, verbal cues for hand placement off elevated  bed   Ambulation/Gait Ambulation/Gait assistance: Min assist;+2 physical assistance;+2 safety/equipment Gait Distance (Feet): 10 Feet Assistive device: Rolling walker (2 wheeled) Gait Pattern/deviations: Step-through pattern;Decreased stride length;Narrow base of support;Trunk flexed Gait velocity: decreased    General Gait Details: very limited amb distance due to increased HR, increased dyspnea and increased c/o fatigue/ weakness with muscle tremors.  Pt required 4 lts with amb to achieve therapeutic levels.     Stairs             Wheelchair Mobility    Modified Rankin (Stroke Patients Only)       Balance                                            Cognition Arousal/Alertness: Awake/alert Behavior During Therapy: WFL for tasks assessed/performed Overall Cognitive Status: Within Functional Limits for tasks assessed                                 General Comments: HOH and required increased time to process instructions       Exercises      General Comments        Pertinent Vitals/Pain Pain Assessment: No/denies pain    Home Living                      Prior Function  PT Goals (current goals can now be found in the care plan section) Progress towards PT goals: Progressing toward goals    Frequency    Min 3X/week      PT Plan Discharge plan needs to be updated    Co-evaluation              AM-PAC PT "6 Clicks" Mobility   Outcome Measure  Help needed turning from your back to your side while in a flat bed without using bedrails?: A Little Help needed moving from lying on your back to sitting on the side of a flat bed without using bedrails?: A Little Help needed moving to and from a bed to a chair (including a wheelchair)?: A Little Help needed standing up from a chair using your arms (e.g., wheelchair or bedside chair)?: A Little Help needed to walk in hospital room?: A Little Help  needed climbing 3-5 steps with a railing? : A Lot 6 Click Score: 17    End of Session Equipment Utilized During Treatment: Gait belt;Oxygen Activity Tolerance: Patient limited by fatigue Patient left: in chair;with family/visitor present;with call bell/phone within reach Nurse Communication: Mobility status PT Visit Diagnosis: Difficulty in walking, not elsewhere classified (R26.2);Muscle weakness (generalized) (M62.81)     Time: 8280-0349 PT Time Calculation (min) (ACUTE ONLY): 29 min  Charges:  $Gait Training: 8-22 mins $Therapeutic Activity: 8-22 mins                     Rica Koyanagi  PTA Acute  Rehabilitation Services Pager      [phone] Office      [phone]

## 2018-09-15 NOTE — Discharge Summary (Signed)
Physician Discharge Summary   Patient ID: Blake Hines MRN: [id number] DOB/AGE: [id number] 83 y.o.  Admit date: 09/04/2018 Discharge date: 09/15/2018  Primary Care Physician:  Crist Infante, MD   Recommendations for Outpatient Follow-up:  1. Follow up with PCP in 1week 2. Needs BMET checked in 1 week, preferably on Wednesday  Home Health: Home health PT OT, RN, home health aide Equipment/Devices: Please see below for multiple DME needs  Discharge Condition: stable  CODE STATUS: FULL Diet recommendation: Heart healthy diet, fluid restriction 1500 cc   Discharge Diagnoses:    . Hyponatremia secondary to SIADH . Acute respiratory failure with hypoxia (HCC) Acute pulmonary embolism Multifocal pneumonia Hypercalcemia  Acute on chronic CKD stage III Diabetes mellitus type 2 with renal complications Essential hypertension COPD Metastatic renal cell CA Sinus tachycardia likely due to acute PE History of bipolar disorder Hyperlipidemia   Consults:  Pulmonology Oncology Nephrology    Allergies:  No Known Allergies   DISCHARGE MEDICATIONS: Allergies as of 09/15/2018   No Known Allergies     Medication List    STOP taking these medications   cholecalciferol 25 MCG (1000 UT) tablet Commonly known as:  VITAMIN D3   hydrochlorothiazide 25 MG tablet Commonly known as:  HYDRODIURIL   losartan 100 MG tablet Commonly known as:  COZAAR   sertraline 50 MG tablet Commonly known as:  ZOLOFT     TAKE these medications   amLODipine 5 MG tablet Commonly known as:  NORVASC Take 1 tablet (5 mg total) by mouth daily.   ANORO ELLIPTA 62.5-25 MCG/INH Aepb Generic drug:  umeclidinium-vilanterol Inhale 1 puff into the lungs daily.   CoQ10 200 MG Caps Take 200 mg by mouth daily.   demeclocycline 150 MG tablet Commonly known as:  DECLOMYCIN Take 1 tablet (150 mg total) by mouth 2 (two) times daily.   enoxaparin 80 MG/0.8ML injection Commonly known as:   LOVENOX Inject 0.8 mLs (80 mg total) into the skin daily.   fenofibrate 48 MG tablet Commonly known as:  TRICOR Take 48 mg by mouth daily.   FREESTYLE LITE test strip Generic drug:  glucose blood USE TO TEST BLOOD SUGAR TWICE DAILY (E11.29)   HUMALOG KWIKPEN 100 UNIT/ML KwikPen Generic drug:  insulin lispro Inject 4 Units into the skin every evening. With dinner   latanoprost 0.005 % ophthalmic solution Commonly known as:  XALATAN Place 1 drop into both eyes at bedtime.   metoprolol tartrate 25 MG tablet Commonly known as:  LOPRESSOR Take 1 tablet (25 mg total) by mouth 2 (two) times daily.   multivitamin with minerals tablet Take 1 tablet by mouth daily.   OLANZapine 2.5 MG tablet Commonly known as:  ZYPREXA Take 2.5 mg by mouth at bedtime.   senna-docusate 8.6-50 MG tablet Commonly known as:  Senokot-S Take 1 tablet by mouth 2 (two) times daily. For constipation   sitaGLIPtin 50 MG tablet Commonly known as:  JANUVIA Take 1 tablet (50 mg total) by mouth every morning. Hold until kidney function has normalized What changed:  additional instructions   TOUJEO SOLOSTAR 300 UNIT/ML Sopn Generic drug:  Insulin Glargine (1 Unit Dial) Inject 6 Units into the skin daily.            Durable Medical Equipment  (From admission, onward)         Start     Ordered   09/15/18 1014  For home use only DME lightweight manual wheelchair with seat cushion  Once  Comments:  Patient suffers from COPD, metastatic renal cancer which impairs their ability to perform daily activities like bathing, standing, walking in the home.  A Rolling [place] Rile will not resolve  issue with performing activities of daily living. A wheelchair will allow patient to safely perform daily activities. Patient is not able to propel themselves in the home using a standard weight wheelchair due to weakness and pain. Patient can self propel in the lightweight wheelchair.  Accessories: elevating leg rests  (ELRs), wheel locks, extensions and anti-tippers.   09/15/18 1014   09/13/18 1333  For home use only DME 3 n 1  Once     09/13/18 1333   09/13/18 1333  For home use only DME Hospital bed  Once    Question Answer Comment  Patient has (list medical condition): Acute respiratory failure with hypoxia, pulmonary embolism, metastatic renal cancer   The above medical condition requires: Patient requires the ability to reposition frequently   Head must be elevated greater than: 45 degrees   Bed type Semi-electric   Support Surface: Low Air loss Mattress      09/13/18 1333   09/13/18 1332  For home use only DME Walker rolling  Once    Comments:  Metastatic renal cancer, pulmonary embolism, gait instability, needs with 5 inches wheels.  Question:  Patient needs a walker to treat with the following condition  Answer:  Renal cancer ([place])   09/13/18 1333   09/13/18 1331  For home use only DME oxygen  Once    Question Answer Comment  Mode or (Route) Nasal cannula   Liters per Minute 4   Frequency Continuous (stationary and portable oxygen unit needed)   Oxygen conserving device Yes   Oxygen delivery system Gas      09/13/18 1333           Brief H and P: For complete details please refer to admission H and P, but in brief 85 year old male with PMH of metastatic renal cell carcinoma, status post left nephrectomy, on immunotherapy, last cycle approximately 3 weeks ago, stage III CKD, COPD, DM 2, HTN, bipolar disorder, presented with ongoing low-grade fever with chills and mild dyspnea at home, decreased appetite and progressive generalized weakness to an extent where he was unable to do his daily activities.  In ED sodium 124, creatinine 1.63.  -Patient was admitted for dehydration with hyponatremia, acute on chronic CKD Hydrated initially with IV saline, dehydration resolved but then his hyponatremia worsened. Work-up suggested SIADH, fluid restricted, oral Lasix low-dose initiated, sodium  improved to 125 but then again worsened to 120. Patient has dyspnea, hypoxia, persistent sinus tachycardia on telemetry, reported 2 episodes of coughing up small blood clots prior to admission, relatively nonambulatory, underlying cancer diagnosis, concern for pulmonary embolism. D-dimer positive, VQ scan intermediate study, lower extremity venous Dopplers negative for DVT, TTE without right heart strain. CTA chest 2/8 confirmed acute PE, possible multifocal pneumonia, superimposed edema, increased mediastinal and hilar adenopathy, pulmonary metastasis.IV heparin was transitioned to full dose Lovenox 2/10.  Palliative was consulted for goals of care.   Hospital Course:   Dehydration with hyponatremia -Likely due to poor oral intake, recent immunotherapy, ongoing HCTZ use, Zoloft -Both HCTZ and Zoloft were discontinued, patient was placed on IV fluid hydration, dehydration has resolved.   -Due to worsening hyponatremia, patient was placed on fluid restriction, NaCl tabs, demeclocycline 150 mg twice daily -Nephrology closely followed the patient  Hyponatremia/suspected SIADH -At baseline, sodium in mid 120s, at the  time of admission sodium 124 however dropped down to 120 with IV saline.  - HCTZ and Zoloft has been discontinued -Na has been improving now to 138 today, sodium chloride tabs have been discontinued -Recommended and discussed with patient's daughter to obtain labs on Wednesday, 09/19/2018 at PCPs office.  If sodium trending up, may need to back off demeclocycline. Currently on fluid restriction recommended 1200 cc.   Acute respiratory failure with hypoxia secondary to acute pulmonary embolism -Dyspnea with hypoxia and small hemoptysis 2 episodes prior to admission, has underlying metastatic renal CA -CT angiogram 2/8 confirmed acute pulmonary embolism with multifocal pneumonia and superimposed edema.  Lower extremity Dopplers negative for DVT. -2D echo showed no right heart  strain. -Initially treated with IV heparin primary oncologist, Dr. Julien Nordmann on 2/10 who recommended full dose Lovenox.  Patient was agreeable, continue full dose Lovenox. -Home O2 evaluation done, will need 4 L O2 upon discharge  Multifocal pneumonia -Seen on CTA chest, patient has completed levofloxacin course for 7 days  Hypercalcemia -Corrected calcium 14.3 with albumin of 1.9,, patient received 1 dose of Aredia on 2/12 and with no significant improvement, was placed on calcitonin x4 doses -PTH low consistent with non-parathyroid hypercalcemia, no bony mets on recent PET - calcium is now improving, down to 12.9, discontinued vitamin D as well  Acute on chronic CKD stage III -Baseline creatinine 1.6, presented with creatinine of 1.9, then plateaued at 2.0 with hypercalcemia, patient also had received contrast for CT angiogram. -Salt tabs now discontinued, creatinine improved to 1.8 at the time of discharge.  Diabetes mellitus type 2 with renal complications -Continue outpatient insulin regimen, hold Januvia until creatinine function has normalized  Essential hypertension -HCTZ discontinued indefinitely, losartan held due to SIADH -BP somewhat elevated with tachycardia, placed on low-dose metoprolol, continue Norvasc  COPD Currently stable, no wheezing  Hyperlipidemia Continue TriCor  History of bipolar disorder Currently stable, continue Zyprexa.  Zoloft has been discontinued.  Outpatient follow-up with patient's psychiatrist  Abnormal LFTs -Improving, unclear etiology, possibly due to immunotherapy  Metastatic renal cell CA - CTA chest shows pulmonary nodules, increasing mediastinal and hilar adenopathy.  -Palliative medicine was consulted, per patient and daughter, full scope of treatment Patient was followed by Dr. Julien Nordmann while inpatient  Sinus tachycardia -Likely due to acute pulmonary embolism, TSH> 10, free T4N T3 normal possibly sick euthyroid  syndrome -Follow thyroid studies in 4 to 6 weeks.  Generalized debility, failure to thrive -Multiple medical issues during hospitalization and metastatic renal cell CA -Physical therapy evaluation was done, patient will go home with home health physical therapy with [facility].  Plan is to transition to ALF from home  Day of Discharge S: Feeling better, no acute issues overnight.  BP (!) 158/90 (BP Location: Left Arm)   Pulse (!) 101   Temp (!) 97.5 F (36.4 C) (Oral)   Resp 18   Ht 5\' 8"  (1.727 m)   Wt 70.3 kg   SpO2 92%   BMI 23.57 kg/m   Physical Exam: General: Alert and awake oriented x3 not in any acute distress. HEENT: anicteric sclera, pupils reactive to light and accommodation CVS: S1-S2 clear no murmur rubs or gallops Chest: Decreased breath sound at the bases Abdomen: soft nontender, nondistended, normal bowel sounds Extremities: no cyanosis, clubbing or edema noted bilaterally Neuro: Cranial nerves II-XII intact, no focal neurological deficits   The results of significant diagnostics from this hospitalization (including imaging, microbiology, ancillary and laboratory) are listed below for reference.  Procedures/Studies:  Dg Chest 2 View  Result Date: 09/04/2018 CLINICAL DATA:  Shortness of breath EXAM: CHEST - 2 VIEW COMPARISON:  08/03/2018 FINDINGS: Patchy bilateral lower lobe airspace opacities are new since prior study concerning for pneumonia. Heart is normal size. No effusions or pneumothorax. No acute bony abnormality. IMPRESSION: Patchy bilateral lower lobe airspace opacities concerning for pneumonia. Electronically Signed   By: Rolm Baptise M.D.   On: 09/04/2018 10:24   Ct Angio Chest Pe W Or Wo Contrast  Result Date: 09/08/2018 CLINICAL DATA:  History of metastatic renal cell carcinoma. EXAM: CT ANGIOGRAPHY CHEST WITH CONTRAST TECHNIQUE: Multidetector CT imaging of the chest was performed using the standard protocol during bolus administration  of intravenous contrast. Multiplanar CT image reconstructions and MIPs were obtained to evaluate the vascular anatomy. CONTRAST:  47mL ISOVUE-370 IOPAMIDOL (ISOVUE-370) INJECTION 76% COMPARISON:  PET-CT 08/10/2018 FINDINGS: Cardiovascular: Normal heart size. Small pericardial effusion. Aorta and main pulmonary artery normal in caliber. Thoracic aortic vascular calcifications. Nonocclusive embolus demonstrated within the distal right lower lobe pulmonary artery (image 164; series 5). Additionally embolus is demonstrated within the right middle lobe segmental and subsegmental pulmonary arteries. Small amount of embolic material demonstrated within the right upper lobe pulmonary arteries. RV/LV ratio less than 1. Mediastinum/Nodes: Re demonstrated extensive mediastinal and hilar adenopathy. Slight interval increase in size of subcarinal lymph node measuring 4.7 cm (image 53; series 4), previously 4.2 cm. Similar-appearing 2.5 cm left inferior hilar lymph node (image 65; series 4), previously 2.5 cm. Interval increase in size of left hilar lymph node measuring 1.5 cm (image 53; series 4), previously 1.0 cm. Lungs/Pleura: Similar-appearing scattered pulmonary nodules. Interval development of masslike area of consolidation within the left lower lobe measuring 6.1 x 3.9 cm. Additionally there is new predominately subpleural ground-glass and consolidative opacities throughout the left lung and right lower lobe. Upper Abdomen: No acute process. Musculoskeletal: Thoracic spine degenerative changes. No aggressive or acute appearing osseous lesions identified. Review of the MIP images confirms the above findings. IMPRESSION: 1. Findings positive for pulmonary embolus predominately within the right middle and right lower lobes as well as right upper lobe. 2. Interval development of a large (6 cm) masslike area of consolidation within the left lower lobe. Additionally there is new patchy predominately subpleural ground-glass and  consolidative opacities throughout the left lung and right lower lobe. Overall findings are concerning for multifocal infectious process. Possibility of superimposed edema not excluded. 3. Interval increase in size of mediastinal and hilar adenopathy. 4. Re demonstrated scattered pulmonary nodules compatible with known metastatic disease. 5. Critical Value/emergent results were called by telephone at the time of interpretation on 09/08/2018 at 6:54 pm to Dr. Vernell Leep , who verbally acknowledged these results. Electronically Signed   By: Lovey Newcomer M.D.   On: 09/08/2018 18:56   Nm Pulmonary Perf And Vent  Result Date: 09/07/2018 CLINICAL DATA:  Stage IV renal cell carcinoma. Elevated D-dimer. Clinical concern for pulmonary embolism. COPD. EXAM: NUCLEAR MEDICINE VENTILATION - PERFUSION LUNG SCAN TECHNIQUE: Ventilation images were obtained in multiple projections using inhaled aerosol Tc-62m DTPA. Perfusion images were obtained in multiple projections after intravenous injection of Tc-85m MAA. RADIOPHARMACEUTICALS:  30.2 mCi of Tc-4m DTPA aerosol inhalation and 4.2 mCi Tc83m MAA IV COMPARISON:  09/06/2018 chest radiograph. FINDINGS: Ventilation: Limited heterogeneous radiotracer throughout both lungs on the ventilation images, with central airway radiotracer accumulation. Perfusion: Numerous large matched segmental perfusion defects throughout both lungs, including within the lower lung zones. IMPRESSION: Intermediate probability for pulmonary embolism (  20-79%) by PIOPED II criteria. Electronically Signed   By: Ilona Sorrel M.D.   On: 09/07/2018 23:23   Dg Chest Port 1 View  Result Date: 09/06/2018 CLINICAL DATA:  Shortness of breath EXAM: PORTABLE CHEST 1 VIEW COMPARISON:  Two days ago FINDINGS: Asymmetric left lower lung opacity compatible with pneumonia. Pulmonary metastatic disease based on recent PET-CT. COPD. Normal heart size. Aortic tortuosity. IMPRESSION: 1. Unchanged asymmetric left lower  pulmonary opacity primarily concerning for pneumonia. 2. Pulmonary metastatic disease. Electronically Signed   By: Monte Fantasia M.D.   On: 09/06/2018 10:31   Vas [place] Lower Extremity Venous (dvt)  Result Date: 09/08/2018  Lower Venous Study Indications: Pulmonary embolism.  Performing Technologist: Abram Sander RVS  Examination Guidelines: A complete evaluation includes B-mode imaging, spectral Doppler, color Doppler, and power Doppler as needed of all accessible portions of each vessel. Bilateral testing is considered an integral part of a complete examination. Limited examinations for reoccurring indications may be performed as noted.  Right Venous Findings: +---------+---------------+---------+-----------+----------+-------+          CompressibilityPhasicitySpontaneityPropertiesSummary +---------+---------------+---------+-----------+----------+-------+ CFV      Full           Yes      Yes                          +---------+---------------+---------+-----------+----------+-------+ SFJ      Full                                                 +---------+---------------+---------+-----------+----------+-------+ FV Prox  Full                                                 +---------+---------------+---------+-----------+----------+-------+ FV Mid   Full                                                 +---------+---------------+---------+-----------+----------+-------+ FV DistalFull                                                 +---------+---------------+---------+-----------+----------+-------+ PFV      Full                                                 +---------+---------------+---------+-----------+----------+-------+ POP      Full           Yes      Yes                          +---------+---------------+---------+-----------+----------+-------+ PTV      Full                                                  +---------+---------------+---------+-----------+----------+-------+  PERO     Full                                                 +---------+---------------+---------+-----------+----------+-------+  Left Venous Findings: +---------+---------------+---------+-----------+----------+-------+          CompressibilityPhasicitySpontaneityPropertiesSummary +---------+---------------+---------+-----------+----------+-------+ CFV      Full           Yes      Yes                          +---------+---------------+---------+-----------+----------+-------+ SFJ      Full                                                 +---------+---------------+---------+-----------+----------+-------+ FV Prox  Full                                                 +---------+---------------+---------+-----------+----------+-------+ FV Mid   Full                                                 +---------+---------------+---------+-----------+----------+-------+ FV DistalFull                                                 +---------+---------------+---------+-----------+----------+-------+ PFV      Full                                                 +---------+---------------+---------+-----------+----------+-------+ POP      Full           Yes      Yes                          +---------+---------------+---------+-----------+----------+-------+ PTV      Full                                                 +---------+---------------+---------+-----------+----------+-------+ PERO     Full                                                 +---------+---------------+---------+-----------+----------+-------+    Summary: Right: There is no evidence of deep vein thrombosis in the lower extremity. No cystic structure found in the popliteal fossa. Left: There is no evidence of deep vein thrombosis in the lower extremity. No cystic structure found in the popliteal fossa.  *See  table(s) above for measurements and observations. Electronically signed by Monica Martinez MD on 09/08/2018 at 10:29:12 AM.    Final        LAB RESULTS: Basic Metabolic Panel: Recent Labs  Lab 09/13/18 0607  09/14/18 1317 09/15/18 0547  NA 131*   < > 136 138  K 4.6   < > 4.6 4.7  CL 95*   < > 97* 98  CO2 27   < > 32 30  GLUCOSE 195*   < > 169* 176*  BUN 51*   < > 54* 53*  CREATININE 1.94*   < > 2.00* 1.83*  CALCIUM 13.4*   < > 12.6* 12.9*  MG 1.9  --   --   --    < > = values in this interval not displayed.   Liver Function Tests: Recent Labs  Lab 09/11/18 0600 09/13/18 0607  AST 46*  --   ALT 117*  --   ALKPHOS 48  --   BILITOT 0.8  --   PROT 6.2*  --   ALBUMIN 2.8* 2.9*   No results for input(s): LIPASE, AMYLASE in the last 168 hours. No results for input(s): AMMONIA in the last 168 hours. CBC: Recent Labs  Lab 09/12/18 0609 09/13/18 0607  WBC 12.8* 12.9*  HGB 13.0 13.1  HCT 40.5 40.7  MCV 85.8 85.9  PLT 451* 422*   Cardiac Enzymes: No results for input(s): CKTOTAL, CKMB, CKMBINDEX, TROPONINI in the last 168 hours. BNP: Invalid input(s): POCBNP CBG: Recent Labs  Lab 09/14/18 2131 09/15/18 0744  GLUCAP 208* 185*      Disposition and Follow-up: Discharge Instructions    Discharge instructions   Complete by:  As directed    Heart healthy diet with fluid restriction 1200 cc in 24 hours   Increase activity slowly   Complete by:  As directed        DISPOSITION: Home health PT OT, RN, home health aide   DISCHARGE FOLLOW-UP Follow-up Information    Crist Infante, MD. Schedule an appointment as soon as possible for a visit on 09/19/2018.   Specialty:  Internal Medicine Why:  for BMET (for renal function, sodium and calcium levels) Contact information: [place] [facility] [address] [phone]            Time coordinating discharge:  42mins   Signed:   Estill Cotta M.D. [facility] 09/15/2018, 10:55  AM

## 2018-09-15 NOTE — Progress Notes (Signed)
SATURATION QUALIFICATIONS: (This note is used to comply with regulatory documentation for home oxygen)  Patient Saturations on Room Air at Rest = 86%     Pt moved to [facility] for bath and dropped from 91 to 86%  Patient Saturations on Room Air while Ambulating =0% not ambulating at this time  Patient Saturations on 0 Liters of oxygen while Ambulating = 0% no oxygen at this time  Please briefly explain why patient needs home oxygen:

## 2018-09-17 ENCOUNTER — Telehealth: Payer: Self-pay | Admitting: *Deleted

## 2018-09-17 DIAGNOSIS — I2699 Other pulmonary embolism without acute cor pulmonale: Secondary | ICD-10-CM | POA: Diagnosis not present

## 2018-09-17 DIAGNOSIS — J9621 Acute and chronic respiratory failure with hypoxia: Secondary | ICD-10-CM | POA: Diagnosis not present

## 2018-09-17 DIAGNOSIS — Z9181 History of falling: Secondary | ICD-10-CM | POA: Diagnosis not present

## 2018-09-17 DIAGNOSIS — C7800 Secondary malignant neoplasm of unspecified lung: Secondary | ICD-10-CM | POA: Diagnosis not present

## 2018-09-17 DIAGNOSIS — N179 Acute kidney failure, unspecified: Secondary | ICD-10-CM | POA: Diagnosis not present

## 2018-09-17 DIAGNOSIS — R627 Adult failure to thrive: Secondary | ICD-10-CM | POA: Diagnosis not present

## 2018-09-17 DIAGNOSIS — Z87891 Personal history of nicotine dependence: Secondary | ICD-10-CM | POA: Diagnosis not present

## 2018-09-17 DIAGNOSIS — I10 Essential (primary) hypertension: Secondary | ICD-10-CM | POA: Diagnosis not present

## 2018-09-17 DIAGNOSIS — F319 Bipolar disorder, unspecified: Secondary | ICD-10-CM | POA: Diagnosis not present

## 2018-09-17 DIAGNOSIS — J188 Other pneumonia, unspecified organism: Secondary | ICD-10-CM | POA: Diagnosis not present

## 2018-09-17 DIAGNOSIS — D631 Anemia in chronic kidney disease: Secondary | ICD-10-CM | POA: Diagnosis not present

## 2018-09-17 DIAGNOSIS — N400 Enlarged prostate without lower urinary tract symptoms: Secondary | ICD-10-CM | POA: Diagnosis not present

## 2018-09-17 DIAGNOSIS — N183 Chronic kidney disease, stage 3 (moderate): Secondary | ICD-10-CM | POA: Diagnosis not present

## 2018-09-17 DIAGNOSIS — E785 Hyperlipidemia, unspecified: Secondary | ICD-10-CM | POA: Diagnosis not present

## 2018-09-17 DIAGNOSIS — J440 Chronic obstructive pulmonary disease with acute lower respiratory infection: Secondary | ICD-10-CM | POA: Diagnosis not present

## 2018-09-17 DIAGNOSIS — C642 Malignant neoplasm of left kidney, except renal pelvis: Secondary | ICD-10-CM | POA: Diagnosis not present

## 2018-09-17 DIAGNOSIS — E222 Syndrome of inappropriate secretion of antidiuretic hormone: Secondary | ICD-10-CM | POA: Diagnosis not present

## 2018-09-17 DIAGNOSIS — Z7901 Long term (current) use of anticoagulants: Secondary | ICD-10-CM | POA: Diagnosis not present

## 2018-09-17 DIAGNOSIS — Z9981 Dependence on supplemental oxygen: Secondary | ICD-10-CM | POA: Diagnosis not present

## 2018-09-17 DIAGNOSIS — E1122 Type 2 diabetes mellitus with diabetic chronic kidney disease: Secondary | ICD-10-CM | POA: Diagnosis not present

## 2018-09-17 DIAGNOSIS — Z794 Long term (current) use of insulin: Secondary | ICD-10-CM | POA: Diagnosis not present

## 2018-09-17 DIAGNOSIS — Z905 Acquired absence of kidney: Secondary | ICD-10-CM | POA: Diagnosis not present

## 2018-09-17 LAB — QUANTIFERON-TB GOLD PLUS (RQFGPL)
QuantiFERON Mitogen Value: 1.42 IU/mL
QuantiFERON Nil Value: 0.04 IU/mL
QuantiFERON TB1 Ag Value: 0.04 IU/mL
QuantiFERON TB2 Ag Value: 0.04 IU/mL

## 2018-09-17 LAB — QUANTIFERON-TB GOLD PLUS: QUANTIFERON-TB GOLD PLUS: NEGATIVE

## 2018-09-17 NOTE — Telephone Encounter (Signed)
Inez Catalina with [facility] called to say patient with recently discharged and daughter is concerned that she will not be able to get him in for labs on Thursday. [facility] has to draw labs on Wednesday and they will draw a CBC and CMP for Dr Julien Nordmann. Will fax [place] the results.

## 2018-09-18 DIAGNOSIS — C7800 Secondary malignant neoplasm of unspecified lung: Secondary | ICD-10-CM | POA: Diagnosis not present

## 2018-09-18 DIAGNOSIS — C642 Malignant neoplasm of left kidney, except renal pelvis: Secondary | ICD-10-CM | POA: Diagnosis not present

## 2018-09-18 DIAGNOSIS — N179 Acute kidney failure, unspecified: Secondary | ICD-10-CM | POA: Diagnosis not present

## 2018-09-18 DIAGNOSIS — E222 Syndrome of inappropriate secretion of antidiuretic hormone: Secondary | ICD-10-CM | POA: Diagnosis not present

## 2018-09-18 DIAGNOSIS — I2699 Other pulmonary embolism without acute cor pulmonale: Secondary | ICD-10-CM | POA: Diagnosis not present

## 2018-09-19 ENCOUNTER — Telehealth: Payer: Self-pay | Admitting: Medical Oncology

## 2018-09-19 NOTE — Telephone Encounter (Signed)
LVM to return my call °

## 2018-09-20 ENCOUNTER — Other Ambulatory Visit: Payer: Self-pay | Admitting: Internal Medicine

## 2018-09-20 ENCOUNTER — Other Ambulatory Visit: Payer: Medicare Other | Admitting: *Deleted

## 2018-09-20 ENCOUNTER — Other Ambulatory Visit: Payer: Medicare Other | Admitting: Licensed Clinical Social Worker

## 2018-09-20 ENCOUNTER — Inpatient Hospital Stay: Payer: Medicare Other

## 2018-09-20 DIAGNOSIS — C642 Malignant neoplasm of left kidney, except renal pelvis: Secondary | ICD-10-CM | POA: Diagnosis not present

## 2018-09-20 DIAGNOSIS — N179 Acute kidney failure, unspecified: Secondary | ICD-10-CM | POA: Diagnosis not present

## 2018-09-20 DIAGNOSIS — N183 Chronic kidney disease, stage 3 (moderate): Secondary | ICD-10-CM | POA: Diagnosis not present

## 2018-09-20 DIAGNOSIS — C7800 Secondary malignant neoplasm of unspecified lung: Secondary | ICD-10-CM | POA: Diagnosis not present

## 2018-09-20 DIAGNOSIS — E222 Syndrome of inappropriate secretion of antidiuretic hormone: Secondary | ICD-10-CM | POA: Diagnosis not present

## 2018-09-20 DIAGNOSIS — I2699 Other pulmonary embolism without acute cor pulmonale: Secondary | ICD-10-CM | POA: Diagnosis not present

## 2018-09-20 DIAGNOSIS — Z515 Encounter for palliative care: Secondary | ICD-10-CM

## 2018-09-21 DIAGNOSIS — N179 Acute kidney failure, unspecified: Secondary | ICD-10-CM | POA: Diagnosis not present

## 2018-09-21 DIAGNOSIS — C7800 Secondary malignant neoplasm of unspecified lung: Secondary | ICD-10-CM | POA: Diagnosis not present

## 2018-09-21 DIAGNOSIS — E222 Syndrome of inappropriate secretion of antidiuretic hormone: Secondary | ICD-10-CM | POA: Diagnosis not present

## 2018-09-21 DIAGNOSIS — I2699 Other pulmonary embolism without acute cor pulmonale: Secondary | ICD-10-CM | POA: Diagnosis not present

## 2018-09-21 DIAGNOSIS — C642 Malignant neoplasm of left kidney, except renal pelvis: Secondary | ICD-10-CM | POA: Diagnosis not present

## 2018-09-23 ENCOUNTER — Encounter (HOSPITAL_COMMUNITY): Payer: Self-pay

## 2018-09-23 ENCOUNTER — Emergency Department (HOSPITAL_COMMUNITY): Payer: Medicare Other

## 2018-09-23 ENCOUNTER — Inpatient Hospital Stay (HOSPITAL_COMMUNITY)
Admission: EM | Admit: 2018-09-23 | Discharge: 2018-09-27 | DRG: 177 | Disposition: A | Discharge status: Hospice | Payer: Medicare Other | Attending: Internal Medicine | Admitting: Internal Medicine

## 2018-09-23 ENCOUNTER — Other Ambulatory Visit: Payer: Self-pay

## 2018-09-23 DIAGNOSIS — F319 Bipolar disorder, unspecified: Secondary | ICD-10-CM | POA: Diagnosis present

## 2018-09-23 DIAGNOSIS — Z7401 Bed confinement status: Secondary | ICD-10-CM | POA: Diagnosis not present

## 2018-09-23 DIAGNOSIS — J449 Chronic obstructive pulmonary disease, unspecified: Secondary | ICD-10-CM | POA: Diagnosis present

## 2018-09-23 DIAGNOSIS — E860 Dehydration: Secondary | ICD-10-CM | POA: Diagnosis present

## 2018-09-23 DIAGNOSIS — J9621 Acute and chronic respiratory failure with hypoxia: Secondary | ICD-10-CM | POA: Diagnosis present

## 2018-09-23 DIAGNOSIS — R5381 Other malaise: Secondary | ICD-10-CM | POA: Diagnosis not present

## 2018-09-23 DIAGNOSIS — I452 Bifascicular block: Secondary | ICD-10-CM | POA: Diagnosis present

## 2018-09-23 DIAGNOSIS — R531 Weakness: Secondary | ICD-10-CM

## 2018-09-23 DIAGNOSIS — Z6824 Body mass index (BMI) 24.0-24.9, adult: Secondary | ICD-10-CM

## 2018-09-23 DIAGNOSIS — Z87891 Personal history of nicotine dependence: Secondary | ICD-10-CM

## 2018-09-23 DIAGNOSIS — Z794 Long term (current) use of insulin: Secondary | ICD-10-CM

## 2018-09-23 DIAGNOSIS — E1122 Type 2 diabetes mellitus with diabetic chronic kidney disease: Secondary | ICD-10-CM | POA: Diagnosis present

## 2018-09-23 DIAGNOSIS — Z8249 Family history of ischemic heart disease and other diseases of the circulatory system: Secondary | ICD-10-CM

## 2018-09-23 DIAGNOSIS — N179 Acute kidney failure, unspecified: Secondary | ICD-10-CM | POA: Diagnosis present

## 2018-09-23 DIAGNOSIS — R410 Disorientation, unspecified: Secondary | ICD-10-CM

## 2018-09-23 DIAGNOSIS — D649 Anemia, unspecified: Secondary | ICD-10-CM | POA: Diagnosis not present

## 2018-09-23 DIAGNOSIS — J690 Pneumonitis due to inhalation of food and vomit: Secondary | ICD-10-CM | POA: Diagnosis present

## 2018-09-23 DIAGNOSIS — E119 Type 2 diabetes mellitus without complications: Secondary | ICD-10-CM

## 2018-09-23 DIAGNOSIS — N400 Enlarged prostate without lower urinary tract symptoms: Secondary | ICD-10-CM | POA: Diagnosis present

## 2018-09-23 DIAGNOSIS — J181 Lobar pneumonia, unspecified organism: Secondary | ICD-10-CM | POA: Diagnosis not present

## 2018-09-23 DIAGNOSIS — Z905 Acquired absence of kidney: Secondary | ICD-10-CM

## 2018-09-23 DIAGNOSIS — C7802 Secondary malignant neoplasm of left lung: Secondary | ICD-10-CM | POA: Diagnosis not present

## 2018-09-23 DIAGNOSIS — N183 Chronic kidney disease, stage 3 (moderate): Secondary | ICD-10-CM | POA: Diagnosis present

## 2018-09-23 DIAGNOSIS — F310 Bipolar disorder, current episode hypomanic: Secondary | ICD-10-CM

## 2018-09-23 DIAGNOSIS — J90 Pleural effusion, not elsewhere classified: Secondary | ICD-10-CM | POA: Diagnosis present

## 2018-09-23 DIAGNOSIS — Z9981 Dependence on supplemental oxygen: Secondary | ICD-10-CM

## 2018-09-23 DIAGNOSIS — C649 Malignant neoplasm of unspecified kidney, except renal pelvis: Secondary | ICD-10-CM | POA: Diagnosis present

## 2018-09-23 DIAGNOSIS — J9601 Acute respiratory failure with hypoxia: Secondary | ICD-10-CM | POA: Diagnosis not present

## 2018-09-23 DIAGNOSIS — D638 Anemia in other chronic diseases classified elsewhere: Secondary | ICD-10-CM | POA: Diagnosis present

## 2018-09-23 DIAGNOSIS — Z66 Do not resuscitate: Secondary | ICD-10-CM

## 2018-09-23 DIAGNOSIS — I129 Hypertensive chronic kidney disease with stage 1 through stage 4 chronic kidney disease, or unspecified chronic kidney disease: Secondary | ICD-10-CM | POA: Diagnosis present

## 2018-09-23 DIAGNOSIS — Z515 Encounter for palliative care: Secondary | ICD-10-CM

## 2018-09-23 DIAGNOSIS — I2699 Other pulmonary embolism without acute cor pulmonale: Secondary | ICD-10-CM | POA: Diagnosis present

## 2018-09-23 DIAGNOSIS — C7800 Secondary malignant neoplasm of unspecified lung: Secondary | ICD-10-CM | POA: Diagnosis present

## 2018-09-23 DIAGNOSIS — E222 Syndrome of inappropriate secretion of antidiuretic hormone: Secondary | ICD-10-CM | POA: Diagnosis present

## 2018-09-23 DIAGNOSIS — J189 Pneumonia, unspecified organism: Secondary | ICD-10-CM | POA: Diagnosis not present

## 2018-09-23 DIAGNOSIS — Z85528 Personal history of other malignant neoplasm of kidney: Secondary | ICD-10-CM

## 2018-09-23 DIAGNOSIS — R636 Underweight: Secondary | ICD-10-CM | POA: Diagnosis present

## 2018-09-23 DIAGNOSIS — F22 Delusional disorders: Secondary | ICD-10-CM | POA: Diagnosis present

## 2018-09-23 DIAGNOSIS — R590 Localized enlarged lymph nodes: Secondary | ICD-10-CM | POA: Diagnosis not present

## 2018-09-23 DIAGNOSIS — Y95 Nosocomial condition: Secondary | ICD-10-CM | POA: Diagnosis present

## 2018-09-23 DIAGNOSIS — R000 Tachycardia, unspecified: Secondary | ICD-10-CM | POA: Diagnosis not present

## 2018-09-23 DIAGNOSIS — Z79899 Other long term (current) drug therapy: Secondary | ICD-10-CM

## 2018-09-23 DIAGNOSIS — M2550 Pain in unspecified joint: Secondary | ICD-10-CM | POA: Diagnosis not present

## 2018-09-23 DIAGNOSIS — R918 Other nonspecific abnormal finding of lung field: Secondary | ICD-10-CM | POA: Diagnosis not present

## 2018-09-23 LAB — COMPREHENSIVE METABOLIC PANEL
ALT: 80 U/L — ABNORMAL HIGH (ref 0–44)
AST: 39 U/L (ref 15–41)
Albumin: 2.5 g/dL — ABNORMAL LOW (ref 3.5–5.0)
Alkaline Phosphatase: 56 U/L (ref 38–126)
Anion gap: 6 (ref 5–15)
BUN: 39 mg/dL — ABNORMAL HIGH (ref 8–23)
CO2: 31 mmol/L (ref 22–32)
Calcium: 11.6 mg/dL — ABNORMAL HIGH (ref 8.9–10.3)
Chloride: 95 mmol/L — ABNORMAL LOW (ref 98–111)
Creatinine, Ser: 2.59 mg/dL — ABNORMAL HIGH (ref 0.61–1.24)
GFR calc Af Amer: 25 mL/min — ABNORMAL LOW (ref >60)
GFR, EST NON AFRICAN AMERICAN: 22 mL/min — AB (ref >60)
Glucose, Bld: 138 mg/dL — ABNORMAL HIGH (ref 70–99)
Potassium: 4.1 mmol/L (ref 3.5–5.1)
Sodium: 132 mmol/L — ABNORMAL LOW (ref 135–145)
Total Bilirubin: 0.3 mg/dL (ref 0.3–1.2)
Total Protein: 6 g/dL — ABNORMAL LOW (ref 6.5–8.1)

## 2018-09-23 LAB — URINALYSIS, ROUTINE W REFLEX MICROSCOPIC
Bilirubin Urine: NEGATIVE
Glucose, UA: NEGATIVE mg/dL
Hgb urine dipstick: NEGATIVE
Ketones, ur: NEGATIVE mg/dL
Nitrite: NEGATIVE
Protein, ur: NEGATIVE mg/dL
Specific Gravity, Urine: 1.006 (ref 1.005–1.030)
pH: 7 (ref 5.0–8.0)

## 2018-09-23 LAB — GLUCOSE, CAPILLARY
Glucose-Capillary: 148 mg/dL — ABNORMAL HIGH (ref 70–99)
Glucose-Capillary: 263 mg/dL — ABNORMAL HIGH (ref 70–99)

## 2018-09-23 LAB — CBC WITH DIFFERENTIAL/PLATELET
Abs Immature Granulocytes: 0.21 10*3/uL — ABNORMAL HIGH (ref 0.00–0.07)
Basophils Absolute: 0.1 10*3/uL (ref 0.0–0.1)
Basophils Relative: 1 %
Eosinophils Absolute: 0.7 10*3/uL — ABNORMAL HIGH (ref 0.0–0.5)
Eosinophils Relative: 6 %
HCT: 37.4 % — ABNORMAL LOW (ref 39.0–52.0)
Hemoglobin: 11.9 g/dL — ABNORMAL LOW (ref 13.0–17.0)
Immature Granulocytes: 2 %
Lymphocytes Relative: 12 %
Lymphs Abs: 1.3 10*3/uL (ref 0.7–4.0)
MCH: 28.1 pg (ref 26.0–34.0)
MCHC: 31.8 g/dL (ref 30.0–36.0)
MCV: 88.4 fL (ref 80.0–100.0)
Monocytes Absolute: 0.8 10*3/uL (ref 0.1–1.0)
Monocytes Relative: 8 %
Neutro Abs: 7.2 10*3/uL (ref 1.7–7.7)
Neutrophils Relative %: 71 %
Platelets: 333 10*3/uL (ref 150–400)
RBC: 4.23 MIL/uL (ref 4.22–5.81)
RDW: 13 % (ref 11.5–15.5)
WBC: 10.2 10*3/uL (ref 4.0–10.5)
nRBC: 0 % (ref 0.0–0.2)

## 2018-09-23 LAB — BRAIN NATRIURETIC PEPTIDE: B Natriuretic Peptide: 47.8 pg/mL (ref 0.0–100.0)

## 2018-09-23 LAB — INFLUENZA PANEL BY PCR (TYPE A & B)
INFLBPCR: NEGATIVE
Influenza A By PCR: NEGATIVE

## 2018-09-23 LAB — TROPONIN I: Troponin I: <0.03 ng/mL (ref <0.03)

## 2018-09-23 LAB — MAGNESIUM: Magnesium: 1.7 mg/dL (ref 1.7–2.4)

## 2018-09-23 MED ORDER — SACCHAROMYCES BOULARDII 250 MG PO CAPS
250.0000 mg | ORAL_CAPSULE | Freq: Two times a day (BID) | ORAL | Status: DC
  Administered 2018-09-23 – 2018-09-27 (×8): 250 mg via ORAL
  Filled 2018-09-23 (×9): qty 1

## 2018-09-23 MED ORDER — METOPROLOL TARTRATE 25 MG PO TABS
25.0000 mg | ORAL_TABLET | Freq: Two times a day (BID) | ORAL | Status: DC
  Administered 2018-09-24 – 2018-09-27 (×7): 25 mg via ORAL
  Filled 2018-09-23 (×7): qty 1

## 2018-09-23 MED ORDER — ACETAMINOPHEN 325 MG PO TABS
650.0000 mg | ORAL_TABLET | Freq: Four times a day (QID) | ORAL | Status: DC | PRN
  Administered 2018-09-23 – 2018-09-26 (×5): 650 mg via ORAL
  Filled 2018-09-23 (×5): qty 2

## 2018-09-23 MED ORDER — SODIUM CHLORIDE 0.45 % IV SOLN
INTRAVENOUS | Status: DC
  Administered 2018-09-23 – 2018-09-24 (×2): via INTRAVENOUS

## 2018-09-23 MED ORDER — ACETAMINOPHEN 650 MG RE SUPP: 650.0000 mg | Freq: Four times a day (QID) | RECTAL | Status: DC | PRN

## 2018-09-23 MED ORDER — ONDANSETRON HCL 4 MG/2ML IJ SOLN: 4.0000 mg | Freq: Four times a day (QID) | INTRAMUSCULAR | Status: DC | PRN

## 2018-09-23 MED ORDER — UMECLIDINIUM-VILANTEROL 62.5-25 MCG/INH IN AEPB
1.0000 | INHALATION_SPRAY | Freq: Every day | RESPIRATORY_TRACT | Status: DC
  Administered 2018-09-23 – 2018-09-27 (×5): 1 via RESPIRATORY_TRACT
  Filled 2018-09-23: qty 14

## 2018-09-23 MED ORDER — OLANZAPINE 5 MG PO TABS
5.0000 mg | ORAL_TABLET | Freq: Every day | ORAL | Status: DC
  Administered 2018-09-23 – 2018-09-26 (×4): 5 mg via ORAL
  Filled 2018-09-23 (×5): qty 1

## 2018-09-23 MED ORDER — ONDANSETRON HCL 4 MG PO TABS: 4.0000 mg | ORAL_TABLET | Freq: Four times a day (QID) | ORAL | Status: DC | PRN

## 2018-09-23 MED ORDER — SODIUM CHLORIDE 0.9 % IV BOLUS
500.0000 mL | Freq: Once | INTRAVENOUS | Status: AC
  Administered 2018-09-23: 500 mL via INTRAVENOUS

## 2018-09-23 MED ORDER — SODIUM CHLORIDE 0.9 % IV SOLN
3.0000 g | Freq: Two times a day (BID) | INTRAVENOUS | Status: DC
  Administered 2018-09-23 – 2018-09-25 (×5): 3 g via INTRAVENOUS
  Filled 2018-09-23 (×6): qty 3

## 2018-09-23 MED ORDER — LATANOPROST 0.005 % OP SOLN
1.0000 [drp] | Freq: Every day | OPHTHALMIC | Status: DC
  Administered 2018-09-23 – 2018-09-26 (×4): 1 [drp] via OPHTHALMIC
  Filled 2018-09-23: qty 2.5

## 2018-09-23 MED ORDER — ADULT MULTIVITAMIN W/MINERALS CH
1.0000 | ORAL_TABLET | Freq: Every day | ORAL | Status: DC
  Administered 2018-09-23 – 2018-09-27 (×5): 1 via ORAL
  Filled 2018-09-23 (×5): qty 1

## 2018-09-23 MED ORDER — ENOXAPARIN SODIUM 80 MG/0.8ML ~~LOC~~ SOLN
70.0000 mg | SUBCUTANEOUS | Status: DC
  Administered 2018-09-23 – 2018-09-27 (×5): 70 mg via SUBCUTANEOUS
  Filled 2018-09-23 (×5): qty 0.8

## 2018-09-23 MED ORDER — AMLODIPINE BESYLATE 5 MG PO TABS
5.0000 mg | ORAL_TABLET | Freq: Every day | ORAL | Status: DC
  Administered 2018-09-24 – 2018-09-27 (×4): 5 mg via ORAL
  Filled 2018-09-23 (×4): qty 1

## 2018-09-23 MED ORDER — PIPERACILLIN-TAZOBACTAM 3.375 G IVPB 30 MIN
3.3750 g | Freq: Once | INTRAVENOUS | Status: AC
  Administered 2018-09-23: 3.375 g via INTRAVENOUS
  Filled 2018-09-23: qty 50

## 2018-09-23 MED ORDER — DEMECLOCYCLINE HCL 150 MG PO TABS
150.0000 mg | ORAL_TABLET | Freq: Two times a day (BID) | ORAL | Status: DC
  Administered 2018-09-23 – 2018-09-27 (×9): 150 mg via ORAL
  Filled 2018-09-23 (×10): qty 1

## 2018-09-23 MED ORDER — INSULIN ASPART 100 UNIT/ML ~~LOC~~ SOLN
0.0000 [IU] | Freq: Three times a day (TID) | SUBCUTANEOUS | Status: DC
  Administered 2018-09-23 – 2018-09-24 (×3): 2 [IU] via SUBCUTANEOUS
  Administered 2018-09-24: 5 [IU] via SUBCUTANEOUS
  Administered 2018-09-25 (×3): 3 [IU] via SUBCUTANEOUS
  Administered 2018-09-26: 8 [IU] via SUBCUTANEOUS
  Administered 2018-09-27: 5 [IU] via SUBCUTANEOUS
  Administered 2018-09-27: 3 [IU] via SUBCUTANEOUS

## 2018-09-23 MED ORDER — VANCOMYCIN HCL IN DEXTROSE 1-5 GM/200ML-% IV SOLN: 1000.0000 mg | Freq: Once | INTRAVENOUS | Status: DC

## 2018-09-23 NOTE — ED Provider Notes (Signed)
[facility] DEPT Provider Note   CSN: [id number] Arrival date & time: 09/23/18  4315    History   Chief Complaint Chief Complaint  Patient presents with  . Abnormal Lab  . Hemoptysis    HPI Blake Hines is a 85 y.o. male.     HPI Patient recently admitted for PE, hyponatremia.  Since that time, daughter reports patient has had thick productive cough of yellow sputum.  She has noticed small flecks of blood in the sputum.  States the patient has been generally weak and drinking a lot of water.  He has felt warm but no documented temperature.  Oxygen saturations dropped into the 80s with minimal exertion.  Had labs performed on Thursday and was called saying that his calcium levels were elevated.  Patient is currently on Lovenox injections for PE. Past Medical History:  Diagnosis Date  . Bipolar 1 disorder ([place])   . BPH (benign prostatic hyperplasia)   . Cancer ([place])    RCC  . Cataract   . Chronic kidney disease   . COPD (chronic obstructive pulmonary disease) ([place])   . Depression   . Diabetes mellitus without complication ([place])   . Hypertension     Patient Active Problem List   Diagnosis Date Noted  . Acute respiratory failure with hypoxia ([place]) 09/12/2018  . Hyponatremia 09/04/2018  . AKI (acute kidney injury) ([place]) 09/04/2018  . Type 2 diabetes mellitus without complication ([place]) 40/03/6760  . Bipolar disorder ([place]) 09/04/2018  . Abnormal liver function test 09/04/2018  . Generalized weakness   . Goals of care, counseling/discussion 07/14/2018  . Encounter for antineoplastic immunotherapy 07/14/2018  . Metastatic renal cell carcinoma to lung, left ([place]) 07/12/2018    History reviewed. No pertinent surgical history.      Home Medications    Prior to Admission medications   Medication Sig Start Date End Date Taking? Authorizing Provider  amLODipine (NORVASC) 5 MG tablet Take 1 tablet (5 mg total) by mouth daily. 09/15/18  Yes  [name], [name], MD  ANORO ELLIPTA 62.5-25 MCG/INH AEPB Inhale 1 puff into the lungs daily.  06/19/18  Yes [provider]  Coenzyme Q10 (COQ10) 200 MG CAPS Take 200 mg by mouth daily.    Yes [provider]  demeclocycline (DECLOMYCIN) 150 MG tablet Take 1 tablet (150 mg total) by mouth 2 (two) times daily. 09/15/18  Yes [name], [name], MD  enoxaparin (LOVENOX) 80 MG/0.8ML injection Inject 0.8 mLs (80 mg total) into the skin daily. 09/15/18  Yes [name], [name], MD  fenofibrate (TRICOR) 48 MG tablet Take 48 mg by mouth daily.   Yes [provider]  insulin lispro (HUMALOG KWIKPEN) 100 UNIT/ML KwikPen Inject 4 Units into the skin every evening. With dinner    Yes [provider]  latanoprost (XALATAN) 0.005 % ophthalmic solution Place 1 drop into both eyes at bedtime.   Yes [provider]  metoprolol tartrate (LOPRESSOR) 25 MG tablet Take 1 tablet (25 mg total) by mouth 2 (two) times daily. 09/15/18  Yes [name], [name], MD  Multiple Vitamins-Minerals (MULTIVITAMIN WITH MINERALS) tablet Take 1 tablet by mouth daily.   Yes [provider]  OLANZapine (ZYPREXA) 5 MG tablet Take 5 mg by mouth at bedtime.    Yes [provider]  senna-docusate (SENOKOT-S) 8.6-50 MG tablet Take 1 tablet by mouth 2 (two) times daily. For constipation 09/15/18  Yes [name], [name], MD  TOUJEO SOLOSTAR 300 UNIT/ML SOPN Inject  6 Units into the skin daily.  08/19/18  Yes [provider]  sitaGLIPtin (JANUVIA) 50 MG tablet Take 1 tablet (50 mg total) by mouth every morning. Hold until kidney function has normalized 09/15/18   [name], [name], MD    Family History History reviewed. No pertinent family history.  Social History Social History   Tobacco Use  . Smoking status: Former Smoker    Packs/day: 0.50    Years: 40.00    Pack years: 20.00    Types: Cigarettes    Last attempt to quit: 07/13/2003    Years since quitting: 15.2  . Smokeless tobacco:  Never Used  Substance Use Topics  . Alcohol use: Yes    Comment: 50 cc wild [place] daily  . Drug use: Not on file     Allergies   Patient has no known allergies.   Review of Systems Review of Systems  Constitutional: Positive for fatigue. Negative for fever.  Respiratory: Positive for cough and shortness of breath.   Cardiovascular: Negative for chest pain.  Gastrointestinal: Negative for abdominal pain, blood in stool, diarrhea, nausea and vomiting.  Musculoskeletal: Negative for back pain and neck pain.  Neurological: Positive for weakness. Negative for light-headedness, numbness and headaches.  All other systems reviewed and are negative.    Physical Exam Updated Vital Signs BP 129/82   Pulse (!) 101   Temp 97.8 F (36.6 C) (Oral)   Resp (!) 21   SpO2 93%   Physical Exam Vitals signs and nursing note reviewed.  Constitutional:      Appearance: He is well-developed.     Comments: Chronically ill-appearing  HENT:     Head: Normocephalic and atraumatic.     Mouth/Throat:     Mouth: Mucous membranes are dry.  Eyes:     Extraocular Movements: Extraocular movements intact.     Pupils: Pupils are equal, round, and reactive to light.  Neck:     Musculoskeletal: Normal range of motion and neck supple. No neck rigidity or muscular tenderness.  Cardiovascular:     Rate and Rhythm: Regular rhythm. Tachycardia present.     Heart sounds: No murmur. No friction rub. No gallop.   Pulmonary:     Effort: Pulmonary effort is normal.     Breath sounds: Rhonchi present.     Comments: Rhonchi in the left lung base.  No acute respiratory distress. Abdominal:     General: Bowel sounds are normal.     Palpations: Abdomen is soft.     Tenderness: There is no abdominal tenderness. There is no guarding or rebound.  Musculoskeletal: Normal range of motion.        General: No swelling, tenderness, deformity or signs of injury.     Right lower leg: No edema.     Left lower leg: No  edema.  Lymphadenopathy:     Cervical: No cervical adenopathy.  Skin:    General: Skin is warm and dry.     Findings: No erythema or rash.  Neurological:     General: No focal deficit present.     Mental Status: He is alert and oriented to person, place, and time.     Comments: Oriented to person and place.  Moving all extremities without focal deficit.  Fine tremor noted.  Psychiatric:        Behavior: Behavior normal.      ED Treatments / Results  Labs (all labs ordered are listed, but only abnormal results are displayed) Labs Reviewed  CBC WITH DIFFERENTIAL/PLATELET - Abnormal; Notable for the following components:      Result Value   Hemoglobin 11.9 (*)    HCT 37.4 (*)    Eosinophils Absolute 0.7 (*)    Abs Immature Granulocytes 0.21 (*)    All other components within normal limits  COMPREHENSIVE METABOLIC PANEL - Abnormal; Notable for the following components:   Sodium 132 (*)    Chloride 95 (*)    Glucose, Bld 138 (*)    BUN 39 (*)    Creatinine, Ser 2.59 (*)    Calcium 11.6 (*)    Total Protein 6.0 (*)    Albumin 2.5 (*)    ALT 80 (*)    GFR calc non Af Amer 22 (*)    GFR calc Af Amer 25 (*)    All other components within normal limits  URINALYSIS, ROUTINE W REFLEX MICROSCOPIC - Abnormal; Notable for the following components:   Leukocytes,Ua TRACE (*)    Bacteria, UA RARE (*)    All other components within normal limits  BRAIN NATRIURETIC PEPTIDE  TROPONIN I  INFLUENZA PANEL BY PCR (TYPE A & B)  MAGNESIUM    EKG EKG Interpretation  Date/Time:  Sunday September 23 2018 10:37:38 EST Ventricular Rate:  102 PR Interval:    QRS Duration: 154 QT Interval:  357 QTC Calculation: 465 R Axis:   -89 Text Interpretation:  Sinus tachycardia RBBB and LAFB Confirmed by Julianne Rice [phone] on 09/23/2018 12:00:57 PM   Radiology Dg Chest 2 View  Result Date: 09/23/2018 CLINICAL DATA:  Hemoptysis. Renal cell cancer. EXAM: CHEST - 2 VIEW COMPARISON:  Chest CT  09/08/2018 FINDINGS: Cardiomediastinal silhouette is normal. Mediastinal contours appear intact. Calcific atherosclerotic disease and tortuosity of the aorta. Prominence of the hilar regions, likely due to lymphadenopathy or hilar/subhilar masses. Low lung volumes with increasing left lower lobe patchy airspace consolidation. Bilateral small pleural effusions. Known metastatic pulmonary nodules not well seen radiographically. Osseous structures are without acute abnormality. Soft tissues are grossly normal. IMPRESSION: 1. Low lung volumes with increasing left lower lobe patchy airspace consolidation. 2. Stable right lower lobe patchy airspace consolidation. 3. Bilateral small pleural effusions. 4. Prominence of the hilar regions, likely due to lymphadenopathy or hilar/subhilar masses. Electronically Signed   By: Fidela Salisbury M.D.   On: 09/23/2018 10:43    Procedures Procedures (including critical care time)  Medications Ordered in ED Medications  piperacillin-tazobactam (ZOSYN) IVPB 3.375 g (has no administration in time range)  vancomycin (VANCOCIN) IVPB 1000 mg/200 mL premix (has no administration in time range)  sodium chloride 0.9 % bolus 500 mL (has no administration in time range)     Initial Impression / Assessment and Plan / ED Course  I have reviewed the triage vital signs and the nursing notes.  Pertinent labs & imaging results that were available during my care of the patient were reviewed by me and considered in my medical decision making (see chart for details).        Worsening left base infiltrate.  Patient continues to take Declomycin as prescribed at discharge.  Concern this is healthcare associated pneumonia versus aspiration.  Will start broad-spectrum antibiotics.  Patient also has mild bump in his creatinine.  Possibly due to dehydration.  Patient does have dry mucous membranes.  Will give IV fluids.  Discussed with hospitalist who will see patient emergency  department and admit.  Final Clinical Impressions(s) / ED Diagnoses   Final diagnoses:  HCAP (healthcare-associated pneumonia)  AKI (acute kidney injury) [facility])    ED Discharge Orders    None       Julianne Rice, MD 09/23/18 1201

## 2018-09-23 NOTE — ED Notes (Signed)
Bed: UG89 Expected date:  Expected time:  Means of arrival:  Comments: Sepsis?

## 2018-09-23 NOTE — Progress Notes (Signed)
Pharmacy Antibiotic Note  Blake Hines is a 85 y.o. male admitted on 09/23/2018 with aspiration pneumonia with acute on chronic hypoxic respiratory failure.  Pharmacy has been consulted for Unasyn dosing.  Plan: Unasyn 3g IV q12h Follow up renal fxn, culture results, and clinical course.      Temp (24hrs), Avg:97.8 F (36.6 C), Min:97.8 F (36.6 C), Max:97.8 F (36.6 C)  Recent Labs  Lab 09/23/18 0924  WBC 10.2  CREATININE 2.59*    Estimated Creatinine Clearance: 20.2 mL/min (A) (by C-G formula based on SCr of 2.59 mg/dL (H)).    No Known Allergies  Antimicrobials this admission: 2/23 Zosyn x1 2/23 Unasyn >>  Dose adjustments this admission:   Microbiology results:   Thank you for allowing pharmacy to be a part of this patient's care.  Gretta Arab PharmD, BCPS Pager [phone] 09/23/2018 1:11 PM

## 2018-09-23 NOTE — Progress Notes (Signed)
ANTICOAGULATION CONSULT NOTE - Initial Consult  Pharmacy Consult for Lovenox Indication: pulmonary embolus  No Known Allergies  Patient Measurements:    Vital Signs: Temp: 97.8 F (36.6 C) (02/23 0902) Temp Source: Oral (02/23 0902) BP: 133/73 (02/23 1300) Pulse Rate: 99 (02/23 1300)  Labs: Recent Labs    09/23/18 0924  HGB 11.9*  HCT 37.4*  PLT 333  CREATININE 2.59*  TROPONINI <0.03    Estimated Creatinine Clearance: 20.2 mL/min (A) (by C-G formula based on SCr of 2.59 mg/dL (H)).   Medical History: Past Medical History:  Diagnosis Date  . Bipolar 1 disorder ([place])   . BPH (benign prostatic hyperplasia)   . Cancer ([place])    RCC  . Cataract   . Chronic kidney disease   . COPD (chronic obstructive pulmonary disease) ([place])   . Depression   . Diabetes mellitus without complication ([place])   . Hypertension      Assessment: 16 yoM admitted on 2/23 with aspiration pna and acute on chronic hypoxic respiratory failure.  PMH is significant for PE found during last admission on 09/10/18, currently on Lovenox prior to admission.  Pharmacy is consulted to dose Lovenox.  PTA Lovenox 80 mg SQ Q24 hrs.  Last dose on 2/22 at 22:00.  Today, 09/23/2018:  SCr 2.59, increasing (Hx of metastatic renal cell carcinoma, s/p nephrectomy)  CBC: Hgb 11.9 is decreased, Plt remains WNL but decreased.  No bleeding or complications reported.  Weight now decreased to 70 kg (last admission was 80 kg)    Goal of Therapy:  Anti-Xa level 0.6-1 units/ml 4hrs after LMWH dose given Monitor platelets by anticoagulation protocol: Yes   Plan:   Lovenox 70 mg (1 mg/kg) [place] q24h - next dose at 22:00.  F/u at least weekly SCr, [place] PharmD, BCPS Pager [phone] 09/23/2018 1:16 PM

## 2018-09-23 NOTE — Progress Notes (Signed)
SLP Cancellation Note  Patient Details Name: Blake Hines MRN: [id number] DOB: 12/25/1930   Cancelled treatment:       Reason Eval/Treat Not Completed: Other (comment);Patient at procedure or test/unavailable.   Elvina Sidle, M.S., CCC-SLP 09/23/2018, 5:35 PM

## 2018-09-23 NOTE — ED Notes (Signed)
ED TO INPATIENT HANDOFF REPORT  Name/Age/Gender Blake Hines 85 y.o. male  Code Status Code Status History    Date Active Date Inactive Code Status Order ID Comments User Context   09/04/2018 1740 09/15/2018 2008 Full Code [id number]  Georgette Shell, MD ED    Advance Directive Documentation     Most Recent Value  Type of Advance Directive  Healthcare Power of [place]  Pre-existing out of facility DNR order (yellow form or pink MOST form)  -  "MOST" Form in Place?  -      Home/SNF/Other Home  Chief Complaint blood in septum  Level of Care/Admitting Diagnosis ED Disposition    ED Disposition Condition [place]: [facility] [100102]  Level of Care: Med-Surg [16]  Diagnosis: Aspiration pneumonia [facility]) [[id number]]  Admitting Physician: Bonnielee Haff [[id number]]  Attending Physician: Bonnielee Haff [[id number]]  Estimated length of stay: past midnight tomorrow  Certification:: I certify this patient will need inpatient services for at least 2 midnights  PT Class (Do Not Modify): Inpatient [101]  PT Acc Code (Do Not Modify): Private [1]       Medical History Past Medical History:  Diagnosis Date  . Bipolar 1 disorder ([place])   . BPH (benign prostatic hyperplasia)   . Cancer ([place])    RCC  . Cataract   . Chronic kidney disease   . COPD (chronic obstructive pulmonary disease) ([place])   . Depression   . Diabetes mellitus without complication ([place])   . Hypertension     Allergies No Known Allergies  IV Location/Drains/Wounds Patient Lines/Drains/Airways Status   Active Line/Drains/Airways    Name:   Placement date:   Placement time:   Site:   Days:   Peripheral IV 09/23/18 Left Antecubital   09/23/18    0946    Antecubital   less than 1   External Urinary Catheter   09/06/18    1801    -   17   Incision (Closed) 07/09/18 Back Left;Lower   07/09/18    1012     76          Labs/Imaging Results for orders placed or performed  during the hospital encounter of 09/23/18 (from the past 48 hour(s))  CBC with Differential/Platelet     Status: Abnormal   Collection Time: 09/23/18  9:24 AM  Result Value Ref Range   WBC 10.2 4.0 - 10.5 K/uL   RBC 4.23 4.22 - 5.81 MIL/uL   Hemoglobin 11.9 (L) 13.0 - 17.0 g/dL   HCT 37.4 (L) 39.0 - 52.0 %   MCV 88.4 80.0 - 100.0 fL   MCH 28.1 26.0 - 34.0 pg   MCHC 31.8 30.0 - 36.0 g/dL   RDW 13.0 11.5 - 15.5 %   Platelets 333 150 - 400 K/uL   nRBC 0.0 0.0 - 0.2 %   Neutrophils Relative % 71 %   Neutro Abs 7.2 1.7 - 7.7 K/uL   Lymphocytes Relative 12 %   Lymphs Abs 1.3 0.7 - 4.0 K/uL   Monocytes Relative 8 %   Monocytes Absolute 0.8 0.1 - 1.0 K/uL   Eosinophils Relative 6 %   Eosinophils Absolute 0.7 (H) 0.0 - 0.5 K/uL   Basophils Relative 1 %   Basophils Absolute 0.1 0.0 - 0.1 K/uL   Immature Granulocytes 2 %   Abs Immature Granulocytes 0.21 (H) 0.00 - 0.07 K/uL    Comment: Performed at [facility], [address] [place], [place] [address]  Brain natriuretic peptide     Status: None   Collection Time: 09/23/18  9:24 AM  Result Value Ref Range   B Natriuretic Peptide 47.8 0.0 - 100.0 pg/mL    Comment: Performed at [facility], [place] [address] [place], [place] [address]  Comprehensive metabolic panel     Status: Abnormal   Collection Time: 09/23/18  9:24 AM  Result Value Ref Range   Sodium 132 (L) 135 - 145 mmol/L   Potassium 4.1 3.5 - 5.1 mmol/L   Chloride 95 (L) 98 - 111 mmol/L   CO2 31 22 - 32 mmol/L   Glucose, Bld 138 (H) 70 - 99 mg/dL   BUN 39 (H) 8 - 23 mg/dL   Creatinine, Ser 2.59 (H) 0.61 - 1.24 mg/dL   Calcium 11.6 (H) 8.9 - 10.3 mg/dL   Total Protein 6.0 (L) 6.5 - 8.1 g/dL   Albumin 2.5 (L) 3.5 - 5.0 g/dL   AST 39 15 - 41 U/L   ALT 80 (H) 0 - 44 U/L   Alkaline Phosphatase 56 38 - 126 U/L   Total Bilirubin 0.3 0.3 - 1.2 mg/dL   GFR calc non Af Amer 22 (L) >60 mL/min   GFR calc Af Amer 25 (L) >60 mL/min   Anion gap 6  5 - 15    Comment: Performed at [facility], [place] [address], [place], [place] [address]  Troponin I - ONCE - STAT     Status: None   Collection Time: 09/23/18  9:24 AM  Result Value Ref Range   Troponin I <0.03 <0.03 ng/mL    Comment: Performed at [facility], [place] [address], [place], [place] [address]  Influenza panel by PCR (type A & B)     Status: None   Collection Time: 09/23/18  9:24 AM  Result Value Ref Range   Influenza A By PCR NEGATIVE NEGATIVE   Influenza B By PCR NEGATIVE NEGATIVE    Comment: (NOTE) The Xpert Xpress Flu assay is intended as an aid in the diagnosis of  influenza and should not be used as a sole basis for treatment.  This  assay is FDA approved for nasopharyngeal swab specimens only. Nasal  washings and aspirates are unacceptable for Xpert Xpress Flu testing. Performed at [facility], [place] [address] [place], [place] [address]   Magnesium     Status: None   Collection Time: 09/23/18  9:24 AM  Result Value Ref Range   Magnesium 1.7 1.7 - 2.4 mg/dL    Comment: Performed at [facility], [place] [address], [place],  [address]  Urinalysis, Routine w reflex microscopic     Status: Abnormal   Collection Time: 09/23/18  9:25 AM  Result Value Ref Range   Color, Urine YELLOW YELLOW   APPearance CLEAR CLEAR   Specific Gravity, Urine 1.006 1.005 - 1.030   pH 7.0 5.0 - 8.0   Glucose, UA NEGATIVE NEGATIVE mg/dL   Hgb urine dipstick NEGATIVE NEGATIVE   Bilirubin Urine NEGATIVE NEGATIVE   Ketones, ur NEGATIVE NEGATIVE mg/dL   Protein, ur NEGATIVE NEGATIVE mg/dL   Nitrite NEGATIVE NEGATIVE   Leukocytes,Ua TRACE (A) NEGATIVE   RBC / HPF 0-5 0 - 5 RBC/hpf   WBC, UA 0-5 0 - 5 WBC/hpf   Bacteria, UA RARE (A) NONE SEEN   Squamous Epithelial / LPF 0-5 0 - 5   Mucus PRESENT  Hyphae Yeast PRESENT    Uric Acid Crys, UA PRESENT     Comment: Performed at [facility],  [place] [address], [place], [place] [address]   Dg Chest 2 View  Result Date: 09/23/2018 CLINICAL DATA:  Hemoptysis. Renal cell cancer. EXAM: CHEST - 2 VIEW COMPARISON:  Chest CT 09/08/2018 FINDINGS: Cardiomediastinal silhouette is normal. Mediastinal contours appear intact. Calcific atherosclerotic disease and tortuosity of the aorta. Prominence of the hilar regions, likely due to lymphadenopathy or hilar/subhilar masses. Low lung volumes with increasing left lower lobe patchy airspace consolidation. Bilateral small pleural effusions. Known metastatic pulmonary nodules not well seen radiographically. Osseous structures are without acute abnormality. Soft tissues are grossly normal. IMPRESSION: 1. Low lung volumes with increasing left lower lobe patchy airspace consolidation. 2. Stable right lower lobe patchy airspace consolidation. 3. Bilateral small pleural effusions. 4. Prominence of the hilar regions, likely due to lymphadenopathy or hilar/subhilar masses. Electronically Signed   By: Fidela Salisbury M.D.   On: 09/23/2018 10:43   EKG Interpretation  Date/Time:  Sunday September 23 2018 10:37:38 EST Ventricular Rate:  102 PR Interval:    QRS Duration: 154 QT Interval:  357 QTC Calculation: 465 R Axis:   -89 Text Interpretation:  Sinus tachycardia RBBB and LAFB Confirmed by Julianne Rice [id number] on 09/23/2018 12:00:57 PM   Pending Labs Unresulted Labs (From admission, onward)    Start     Ordered   Signed and Held  CBC  Tomorrow morning,   R     Signed and Held   Signed and Held  Comprehensive metabolic panel  Tomorrow morning,   R     Signed and Held          Vitals/Pain Today's Vitals   09/23/18 0902 09/23/18 0910 09/23/18 1100 09/23/18 1130  BP: (!) 146/84  139/81 129/82  Pulse: (!) 103  (!) 104 (!) 101  Resp: 18  (!) 31 (!) 21  Temp: 97.8 F (36.6 C)     TempSrc: Oral     SpO2: 95%  93% 93%  PainSc:  0-No pain      Isolation Precautions No active  isolations  Medications Medications  piperacillin-tazobactam (ZOSYN) IVPB 3.375 g (3.375 g Intravenous New Bag/Given 09/23/18 1204)  vancomycin (VANCOCIN) IVPB 1000 mg/200 mL premix (has no administration in time range)  sodium chloride 0.9 % bolus 500 mL (500 mLs Intravenous New Bag/Given 09/23/18 1205)    Mobility walks with person assist

## 2018-09-23 NOTE — H&P (Addendum)
[facility] History and Physical  Blake Hines OEV:[id number] DOB: Nov 11, 1930 DOA: 09/23/2018   PCP: Crist Infante, MD  Specialists: Followed by psychiatry Dr. Etta Quill at [facility]  Chief Complaint: Worsening cough over the last 2 days  HPI: Blake Hines is a 85 y.o. male with a past medical history of metastatic renal cell carcinoma status post nephrectomy, on immunotherapy with last cycle approximately 4 weeks ago, history of bipolar disorder, chronic kidney disease stage III, history of COPD, chronic respiratory failure on home oxygen, diabetes mellitus type 2, essential hypertension who was last hospitalized here earlier this month and discharged on 09/15/2018.  At that time he was admitted due to dehydration and was noted to have hyponatremia.  He also had hypercalcemia and multifocal pneumonia.  He was found to have acute respiratory failure.  He was also found to have pulmonary embolism.  Patient was discharged on Lovenox as well as demeclocycline.  Calcium level had improved with treatment in the hospital.  Most of the history is provided by the patient's daughter.  He was doing well for the initial few days after coming from the hospital and then over the last 2 days he has been progressively getting weaker.  He developed a cough with yellowish expectoration occasionally blood-tinged.  His urine has been darker.  His oral intake has been poor.  He has not had any nausea vomiting.  The daughter did notice some coughing episodes when he would eat and drink.  Patient denies any chest pain.  No fever or chills.  In the emergency department patient was found to have worsening renal function.  Calcium level was actually improved from before.  Chest x-ray showed worsening left lower lobe infiltrate.  He will need hospitalization for further management.  Home Medications: Prior to Admission medications   Medication Sig Start Date End Date Taking? Authorizing Provider  amLODipine (NORVASC) 5 MG  tablet Take 1 tablet (5 mg total) by mouth daily. 09/15/18  Yes [name], [name], MD  ANORO ELLIPTA 62.5-25 MCG/INH AEPB Inhale 1 puff into the lungs daily.  06/19/18  Yes [provider]  Coenzyme Q10 (COQ10) 200 MG CAPS Take 200 mg by mouth daily.    Yes [provider]  demeclocycline (DECLOMYCIN) 150 MG tablet Take 1 tablet (150 mg total) by mouth 2 (two) times daily. 09/15/18  Yes [name], [name], MD  enoxaparin (LOVENOX) 80 MG/0.8ML injection Inject 0.8 mLs (80 mg total) into the skin daily. 09/15/18  Yes [name], [name], MD  fenofibrate (TRICOR) 48 MG tablet Take 48 mg by mouth daily.   Yes [provider]  insulin lispro (HUMALOG KWIKPEN) 100 UNIT/ML KwikPen Inject 4 Units into the skin every evening. With dinner    Yes [provider]  latanoprost (XALATAN) 0.005 % ophthalmic solution Place 1 drop into both eyes at bedtime.   Yes [provider]  metoprolol tartrate (LOPRESSOR) 25 MG tablet Take 1 tablet (25 mg total) by mouth 2 (two) times daily. 09/15/18  Yes [name], [name], MD  Multiple Vitamins-Minerals (MULTIVITAMIN WITH MINERALS) tablet Take 1 tablet by mouth daily.   Yes [provider]  OLANZapine (ZYPREXA) 5 MG tablet Take 5 mg by mouth at bedtime.    Yes [provider]  senna-docusate (SENOKOT-S) 8.6-50 MG tablet Take 1 tablet by mouth 2 (two) times daily. For constipation 09/15/18  Yes [name], [name], MD  TOUJEO SOLOSTAR 300 UNIT/ML SOPN Inject 6 Units into the skin daily.  08/19/18  Yes [provider]  sitaGLIPtin (JANUVIA) 50 MG tablet Take 1 tablet (50 mg total) by mouth every morning. Hold until kidney function has normalized 09/15/18   [name], [name], MD    Allergies: No Known Allergies  Past Medical History: Past Medical History:  Diagnosis Date  . Bipolar 1 disorder ([place])   . BPH (benign prostatic hyperplasia)   . Cancer ([place])    RCC  . Cataract   . Chronic kidney disease   . COPD (chronic  obstructive pulmonary disease) ([place])   . Depression   . Diabetes mellitus without complication ([place])   . Hypertension     History reviewed. No pertinent surgical history.  Social History: He is currently living with his daughter.  Previous history of smoking many years ago.  Used to have 1 drink on a daily basis still he was hospitalized and now has had only 1 drink in the last 1 week.   Family History: Hypertension   Review of Systems - History obtained from the patient and family General ROS: positive for  - fatigue Psychological ROS: positive for - behavioral disorder Ophthalmic ROS: negative ENT ROS: negative Allergy and Immunology ROS: negative Hematological and Lymphatic ROS: negative Endocrine ROS: negative Respiratory ROS: as in hpi Cardiovascular ROS: as in hpi Gastrointestinal ROS: no abdominal pain, change in bowel habits, or black or bloody stools Genito-Urinary ROS: no dysuria, trouble voiding, or hematuria Musculoskeletal ROS: negative Neurological ROS: no TIA or stroke symptoms Dermatological ROS: negative  Physical Examination  Vitals:   09/23/18 0902 09/23/18 1100 09/23/18 1130  BP: (!) 146/84 139/81 129/82  Pulse: (!) 103 (!) 104 (!) 101  Resp: 18 (!) 31 (!) 21  Temp: 97.8 F (36.6 C)    TempSrc: Oral    SpO2: 95% 93% 93%    BP 129/82   Pulse (!) 101   Temp 97.8 F (36.6 C) (Oral)   Resp (!) 21   SpO2 93%   General appearance: alert, cooperative, appears stated age, distracted and no distress Head: Normocephalic, without obvious abnormality, atraumatic Eyes: conjunctivae/corneas clear. PERRL, EOM's intact.  Throat: Dry mucous membrane Neck: no adenopathy, no carotid bruit, no JVD, supple, symmetrical, trachea midline and thyroid not enlarged, symmetric, no tenderness/mass/nodules Resp: Crackles noted in the left base.  With rhonchi.  No wheezing.  Mildly tachypneic.  No use of accessory muscles. Cardio: regular rate and rhythm, S1, S2 normal,  no murmur, click, rub or gallop GI: soft, non-tender; bowel sounds normal; no masses,  no organomegaly Extremities: extremities normal, atraumatic, no cyanosis or edema Pulses: 2+ and symmetric Skin: Skin color, texture, turgor normal. No rashes or lesions Lymph nodes: Cervical, supraclavicular, and axillary nodes normal. Neurologic: No focal neurological deficits.  He is awake alert.  Oriented to place person.  Mildly distracted.   Labs on Admission: I have personally reviewed following labs and imaging studies  CBC: Recent Labs  Lab 09/23/18 0924  WBC 10.2  NEUTROABS 7.2  HGB 11.9*  HCT 37.4*  MCV 88.4  PLT 409   Basic Metabolic Panel: Recent Labs  Lab 09/23/18 0924  NA 132*  K 4.1  CL 95*  CO2 31  GLUCOSE 138*  BUN 39*  CREATININE 2.59*  CALCIUM 11.6*  MG 1.7   GFR: Estimated Creatinine Clearance: 20.2 mL/min (A) (by C-G formula based on SCr of 2.59 mg/dL (H)). Liver Function Tests: Recent Labs  Lab 09/23/18 0924  AST 39  ALT 80*  ALKPHOS 56  BILITOT 0.3  PROT 6.0*  ALBUMIN 2.5*   Cardiac Enzymes: Recent Labs  Lab 09/23/18 0924  TROPONINI <0.03    Radiological Exams on Admission: Dg Chest 2 View  Result Date: 09/23/2018 CLINICAL DATA:  Hemoptysis. Renal cell cancer. EXAM: CHEST - 2 VIEW COMPARISON:  Chest CT 09/08/2018 FINDINGS: Cardiomediastinal silhouette is normal. Mediastinal contours appear intact. Calcific atherosclerotic disease and tortuosity of the aorta. Prominence of the hilar regions, likely due to lymphadenopathy or hilar/subhilar masses. Low lung volumes with increasing left lower lobe patchy airspace consolidation. Bilateral small pleural effusions. Known metastatic pulmonary nodules not well seen radiographically. Osseous structures are without acute abnormality. Soft tissues are grossly normal. IMPRESSION: 1. Low lung volumes with increasing left lower lobe patchy airspace consolidation. 2. Stable right lower lobe patchy airspace  consolidation. 3. Bilateral small pleural effusions. 4. Prominence of the hilar regions, likely due to lymphadenopathy or hilar/subhilar masses. Electronically Signed   By: Fidela Salisbury M.D.   On: 09/23/2018 10:43    My interpretation of Electrocardiogram: Sinus tachycardia at 102 beats per minute.  RBBB and LAFB noted as before.   Problem List  Principal Problem:   Aspiration pneumonia ([place]) Active Problems:   Metastatic renal cell carcinoma to lung, left (HCC)   AKI (acute kidney injury) ([place])   Type 2 diabetes mellitus without complication (HCC)   Bipolar disorder ([place])   Acute respiratory failure with hypoxia (HCC)   Hypercalcemia   Assessment: This is a 85 year old Caucasian male past medical history as discussed earlier who recently discharged from the hospital and presents with worsening cough, generalized weakness and fatigue poor oral intake for the past 2 to 3 days.  He is found to have acute renal failure.  He also is noted to have worsening pneumonia in the left base.  There is concern for aspiration.  Plan:  1. Aspiration pneumonia with acute on chronic hypoxic respiratory failure: Patient is on 2 L of oxygen at home.  Recently his oxygen was increased to 4 L due to hypoxia.  This was done by his home health providers.  We will place him on Unasyn.  Probiotics.  Speech therapy to evaluate swallow function.  Aspiration precautions.  2.  Acute renal failure on chronic kidney disease stage III: When he was discharged his creatinine was 7.83.  Creatinine today is 2.59.  He has had poor oral intake in the last few days which could be the reason for his worsening renal function.  We will give him IV fluids.  Monitor urine output.  Of note patient did receive contrast for CT angiogram during his previous hospitalization.  3. Hyponatremia secondary to SIADH: Sodium was in the 120s at the previous hospitalization.  He was seen by nephrology.  Thought to have SIADH.  Started on  demeclocycline with improvement in sodium level.  He continues to be on demeclocycline.  Sodium is 132 today.  4.  Hypercalcemia of malignancy: He did have significant elevation in his calcium level during his previous hospitalization.  He was given a dose of Aredia on 2/12.  Calcium level improved.  Calcium level noted to be 11.6 today.  Corrected for albumin of 2.5, it is about 12.8.  Renal failure and dehydration likely also contributing.  He will be hydrated.  Recheck labs tomorrow.  5. Acute pulmonary embolism: Continue Lovenox injections.  6.  Normocytic anemia: No evidence of overt bleeding.  Monitor hemoglobin closely.  Could be due to chronic disease.  7. History of bipolar disorder: Patient was taken  off of his Zoloft due to hyponatremia.  He was continued on Zyprexa.  According to the patient's daughter he has had some paranoia since he has been home.  After discussions with his PCP his Zyprexa dose was increased from 2.5 to 5 mg on Friday.  Patient was supposed to be visited by his psychiatrist today at home.  We will consult psychiatry here in the hospital.  8. Essential hypertension: Patient used to be on losartan and HCTZ both of which were discontinued during previous hospitalization.  Now on metoprolol and amlodipine which will be continued.  9. History of COPD: Stable.  10.  History of hyperlipidemia: Stable.    11. Metastatic renal cell carcinoma: Status post nephrectomy previously.  CT scan of the chest showed pulmonary nodules, mediastinal and hilar adenopathy.  Patient was seen by Dr. Julien Nordmann while he was here during his previous hospitalization.  He was also seen by palliative medicine.  Full scope of treatment was desired by family.  12. Abnormal LFTs: Unclear etiology.  Seems to be stable.  13. Generalized debility/failure to thrive: This is due to his multiple medical issues.  He was seen by physical therapy during previous hospitalization and skilled nursing facility  was recommended however patient and family wanted to go home with home health.  PT to reevaluate.  Palliative medicine will be consulted as well.  14. Diabetes mellitus type 2: Patient noted to be on insulin at home.  Held for now.  SSI.  Monitor CBGs.  No recent HbA1c.  Will check tomorrow morning.   DVT Prophylaxis: On full dose Lovenox Code Status: Full code Family Communication: Discussed with patient's daughter and granddaughter Disposition: PT to reevaluate.  Will need to discuss again disposition with family. Consults called: Psychiatry Admission Status: Inpatient  Severity of Illness: The appropriate patient status for this patient is INPATIENT. Inpatient status is judged to be reasonable and necessary in order to provide the required intensity of service to ensure the patient's safety. The patient's presenting symptoms, physical exam findings, and initial radiographic and laboratory data in the context of their chronic comorbidities is felt to place them at high risk for further clinical deterioration. Furthermore, it is not anticipated that the patient will be medically stable for discharge from the hospital within 2 midnights of admission. The following factors support the patient status of inpatient.   " The patient's presenting symptoms include cough, shortness of breath. " The worrisome physical exam findings include crackles in the left base. " The initial radiographic and laboratory data are worrisome because of acute renal failure. " The chronic co-morbidities include metastatic renal cell cancer.   * I certify that at the point of admission it is my clinical judgment that the patient will require inpatient hospital care spanning beyond 2 midnights from the point of admission due to high intensity of service, high risk for further deterioration and high frequency of surveillance required.*    Further management decisions will depend on results of further testing and  patient's response to treatment.  Akilah Cureton [employer]  [facility] operational officer on [email or web address]  09/23/2018, 12:26 PM

## 2018-09-23 NOTE — ED Triage Notes (Signed)
EMS reports from home, renal cell cancer pat, suspected elevated calcium, hemoptysis. Cal12.2 Friday.  BP 142/90 RR 16 HR 100 Sp02 92 on 3lts CBG 137

## 2018-09-24 DIAGNOSIS — C649 Malignant neoplasm of unspecified kidney, except renal pelvis: Secondary | ICD-10-CM

## 2018-09-24 DIAGNOSIS — R918 Other nonspecific abnormal finding of lung field: Secondary | ICD-10-CM

## 2018-09-24 DIAGNOSIS — R590 Localized enlarged lymph nodes: Secondary | ICD-10-CM

## 2018-09-24 DIAGNOSIS — Z515 Encounter for palliative care: Secondary | ICD-10-CM

## 2018-09-24 DIAGNOSIS — C7802 Secondary malignant neoplasm of left lung: Secondary | ICD-10-CM

## 2018-09-24 DIAGNOSIS — R410 Disorientation, unspecified: Secondary | ICD-10-CM

## 2018-09-24 DIAGNOSIS — D649 Anemia, unspecified: Secondary | ICD-10-CM

## 2018-09-24 DIAGNOSIS — Z66 Do not resuscitate: Secondary | ICD-10-CM

## 2018-09-24 DIAGNOSIS — I2699 Other pulmonary embolism without acute cor pulmonale: Secondary | ICD-10-CM

## 2018-09-24 DIAGNOSIS — E119 Type 2 diabetes mellitus without complications: Secondary | ICD-10-CM

## 2018-09-24 DIAGNOSIS — R531 Weakness: Secondary | ICD-10-CM

## 2018-09-24 LAB — COMPREHENSIVE METABOLIC PANEL
ALT: 67 U/L — ABNORMAL HIGH (ref 0–44)
AST: 29 U/L (ref 15–41)
Albumin: 2.4 g/dL — ABNORMAL LOW (ref 3.5–5.0)
Alkaline Phosphatase: 55 U/L (ref 38–126)
Anion gap: 8 (ref 5–15)
BUN: 38 mg/dL — ABNORMAL HIGH (ref 8–23)
CO2: 28 mmol/L (ref 22–32)
CREATININE: 2.52 mg/dL — AB (ref 0.61–1.24)
Calcium: 11.2 mg/dL — ABNORMAL HIGH (ref 8.9–10.3)
Chloride: 94 mmol/L — ABNORMAL LOW (ref 98–111)
GFR calc Af Amer: 26 mL/min — ABNORMAL LOW (ref >60)
GFR, EST NON AFRICAN AMERICAN: 22 mL/min — AB (ref >60)
Glucose, Bld: 139 mg/dL — ABNORMAL HIGH (ref 70–99)
Potassium: 4.4 mmol/L (ref 3.5–5.1)
Sodium: 130 mmol/L — ABNORMAL LOW (ref 135–145)
Total Bilirubin: 0.7 mg/dL (ref 0.3–1.2)
Total Protein: 5.6 g/dL — ABNORMAL LOW (ref 6.5–8.1)

## 2018-09-24 LAB — CBC
HCT: 37.3 % — ABNORMAL LOW (ref 39.0–52.0)
Hemoglobin: 11.5 g/dL — ABNORMAL LOW (ref 13.0–17.0)
MCH: 27.3 pg (ref 26.0–34.0)
MCHC: 30.8 g/dL (ref 30.0–36.0)
MCV: 88.6 fL (ref 80.0–100.0)
NRBC: 0 % (ref 0.0–0.2)
Platelets: 334 10*3/uL (ref 150–400)
RBC: 4.21 MIL/uL — ABNORMAL LOW (ref 4.22–5.81)
RDW: 12.9 % (ref 11.5–15.5)
WBC: 9.9 10*3/uL (ref 4.0–10.5)

## 2018-09-24 LAB — HEMOGLOBIN A1C
HEMOGLOBIN A1C: 7.6 % — AB (ref 4.8–5.6)
Mean Plasma Glucose: 171.42 mg/dL

## 2018-09-24 LAB — GLUCOSE, CAPILLARY
Glucose-Capillary: 133 mg/dL — ABNORMAL HIGH (ref 70–99)
Glucose-Capillary: 247 mg/dL — ABNORMAL HIGH (ref 70–99)
Glucose-Capillary: 142 mg/dL — ABNORMAL HIGH (ref 70–99)
Glucose-Capillary: 163 mg/dL — ABNORMAL HIGH (ref 70–99)

## 2018-09-24 MED ORDER — RESOURCE THICKENUP CLEAR PO POWD
ORAL | Status: DC | PRN
  Filled 2018-09-24: qty 125

## 2018-09-24 MED ORDER — SODIUM CHLORIDE 0.9 % IV SOLN
INTRAVENOUS | Status: DC
  Administered 2018-09-24 – 2018-09-26 (×2): via INTRAVENOUS

## 2018-09-24 NOTE — Evaluation (Signed)
Occupational Therapy Evaluation Patient Details Name: Blake Hines MRN: [id number] DOB: 01-Dec-1930 Today's Date: 09/24/2018    History of Present Illness Patient is an 85 y/o male presenting to the ED on 09/23/18 with primary complaints of worsening cough. Chest x-ray showed worsening left lower lobe infiltrate. Recent hospitalization and discharge on 09/15/18. PMH significant for metastatic renal cell carcinoma status post nephrectomy, on immunotherapy with last cycle approximately 4 weeks ago, history of bipolar disorder, chronic kidney disease stage III, history of COPD, chronic respiratory failure on home oxygen, diabetes mellitus type 2, essential hypertension. Admitted for Aspiration pneumonia with acute on chronic hypoxic respiratory failure.   Clinical Impression   PTA, pt was living with his daughter who assisted with ADLs and IADLs; pt using RW for functional mobility. Pt currently requiring Min A for UB ADLs, Mod-Max A for LB ADLs, and Min A +2 for functional transfer with RW. Pt presenting with decreased balance, strength, and activity tolerance. SpO2 ranging between 91-88% on 3L and HR elevating to 130s during activity. Noting SOB and fatigue during ADLs and transfers. Pt would benefit from further acute OT to facilitate safe dc. Pending pt progress and family decision, recommend post-acute rehab (SNF vs home with [facility]) for further OT to optimize safety, independence with ADLs, and return to PLOF.     Follow Up Recommendations  SNF;Supervision/Assistance - 24 hour ; Pending family decision with palliative team, home with [facility]   Equipment Recommendations  Other (comment)(Defer to next venue)    Recommendations for Other Services PT consult     Precautions / Restrictions Precautions Precautions: Fall Precaution Comments: monitor o2 sats and HR, HOH Restrictions Weight Bearing Restrictions: No      Mobility Bed Mobility Overal bed mobility: Needs Assistance Bed Mobility:  Supine to Sit     Supine to sit: Min guard;Min assist     General bed mobility comments: increased time and effort; cueing for breathing patterns with patient able to follow commands  Transfers Overall transfer level: Needs assistance Equipment used: Rolling walker (2 wheeled);2 person hand held assist Transfers: Sit to/from [facility] Sit to Stand: Min assist;+2 physical assistance Stand pivot transfers: Min assist;+2 physical assistance;+2 safety/equipment       General transfer comment: MIn A+2 to stand from bedside; cueing for safety and sequencing; tendency for posterior weight shift with B feet in DF; with pivot requires cueing and assist for RW management for safety and stability    Balance Overall balance assessment: Needs assistance Sitting-balance support: Feet supported Sitting balance-Leahy Scale: Fair     Standing balance support: Bilateral upper extremity supported;During functional activity Standing balance-Leahy Scale: Poor Standing balance comment: UE support for balance                           ADL either performed or assessed with clinical judgement   ADL Overall ADL's : Needs assistance/impaired Eating/Feeding: Set up;Sitting   Grooming: Set up;Sitting;Supervision/safety   Upper Body Bathing: Minimal assistance;Sitting   Lower Body Bathing: Moderate assistance;Sit to/from stand   Upper Body Dressing : Minimal assistance;Sitting   Lower Body Dressing: Maximal assistance;Sit to/from stand   Toilet Transfer: Minimal assistance;+2 for physical assistance;Stand-pivot;RW(Simulated to recliner) Toilet Transfer Details (indicate cue type and reason): Min A +2 for balance and safety. Noting decreased endurance and requiring cues for RW management         Functional mobility during ADLs: Minimal assistance;+2 for physical assistance;Rolling walker(stand pivot only) General  ADL Comments: Pt presenting with decreased strength,  balance, and endurance.      Vision Baseline Vision/History: Wears glasses Wears Glasses: Reading only Patient Visual Report: No change from baseline       Perception     Praxis      Pertinent Vitals/Pain Pain Assessment: No/denies pain     Hand Dominance     Extremity/Trunk Assessment Upper Extremity Assessment Upper Extremity Assessment: Generalized weakness   Lower Extremity Assessment Lower Extremity Assessment: Defer to PT evaluation   Cervical / Trunk Assessment Cervical / Trunk Assessment: Normal   Communication Communication Communication: HOH   Cognition Arousal/Alertness: Awake/alert Behavior During Therapy: WFL for tasks assessed/performed Overall Cognitive Status: Within Functional Limits for tasks assessed                                 General Comments: Pt requiring increased time throughout session. Feel he is at baseline cognition   General Comments  Daughter present and very supportive. Pt has apartement at ALF in [facility] on hold. Family also considering pallative care. SpO2 91-88% on 3L O2. HR elevating to 130 during activity.    Exercises     Shoulder Instructions      Home Living Family/patient expects to be discharged to:: Private residence Living Arrangements: Alone(daughter can provide assist) Available Help at Discharge: Family;Available PRN/intermittently;Available 24 hours/day Type of Home: House Home Access: Stairs to enter [employer] of Steps: 1 then 2   Home Layout: Two level Alternate Level Stairs-Number of Steps: flight Alternate Level Stairs-Rails: Left Bathroom Shower/Tub: Occupational psychologist: Standard     Home Equipment: Environmental consultant - 2 wheels;Cane - single point;Bedside commode   Additional Comments: bed/bath upstairs - has been living in dining room      Prior Functioning/Environment Level of Independence: Needs assistance  Gait / Transfers Assistance Needed: use of RW for  limited household mobility ADL's / Homemaking Assistance Needed: daughter assist with ADLs including dressing and bathing. Pt able to perform self feeding and grooming Communication / Swallowing Assistance Needed: HOH          OT Problem List: Decreased strength;Decreased range of motion;Decreased activity tolerance;Impaired balance (sitting and/or standing);Decreased knowledge of use of DME or AE;Decreased knowledge of precautions;Cardiopulmonary status limiting activity      OT Treatment/Interventions: Self-care/ADL training;Therapeutic exercise;Energy conservation;DME and/or AE instruction;Therapeutic activities;Patient/family education    OT Goals(Current goals can be found in the care plan section) Acute Rehab OT Goals Patient Stated Goal: return home soon OT Goal Formulation: With patient/family Time For Goal Achievement: 10/19/2018 Potential to Achieve Goals: Good  OT Frequency: Min 2X/week   Barriers to D/C:            Co-evaluation PT/OT/SLP Co-Evaluation/Treatment: Yes Reason for Co-Treatment: For patient/therapist safety;To address functional/ADL transfers   OT goals addressed during session: ADL's and self-care      AM-PAC OT "6 Clicks" Daily Activity     Outcome Measure Help from another person eating meals?: None Help from another person taking care of personal grooming?: A Little Help from another person toileting, which includes using toliet, bedpan, or urinal?: A Lot Help from another person bathing (including washing, rinsing, drying)?: A Lot Help from another person to put on and taking off regular upper body clothing?: A Little Help from another person to put on and taking off regular lower body clothing?: A Lot 6 Click Score: 16  End of Session Equipment Utilized During Treatment: Rolling walker;Oxygen(3L) Nurse Communication: Mobility status;Other (comment)(SpO2 and HR)  Activity Tolerance: Patient tolerated treatment well Patient left: in chair;with  call bell/phone within reach;with chair alarm set;with family/visitor present  OT Visit Diagnosis: Unsteadiness on feet (R26.81);Other abnormalities of gait and mobility (R26.89);Muscle weakness (generalized) (M62.81)                Time: 6226-3335 OT Time Calculation (min): 17 min Charges:  OT General Charges $OT Visit: 1 Visit OT Evaluation $OT Eval Moderate Complexity: [place], OTR/L Acute Rehab Pager: [phone] Office: [place] 09/24/2018, 10:04 AM

## 2018-09-24 NOTE — Progress Notes (Signed)
  Speech Language Pathology Treatment: Dysphagia  Patient Details Name: Blake Hines MRN: [id number] DOB: October 22, 1930 Today's Date: 09/24/2018 Time: 1206-1228 SLP Time Calculation (min) (ACUTE ONLY): 22 min  Assessment / Plan / Recommendation Clinical Impression  Second session indicated to educate daughter to recommendations/findings and gather more premorbid information.  She admits to pt coughing less frequently with ensure - which is slighter thicker.  Reviewed options of pursuing further testing *MBS* to allow visualization of swallow or modify diet/behaviors to improve po tolerance/decrease cough. Both pt and daughter are agreeable to latter. Pt posterior oral cavity- soft palate - is erythremic and he complains of xerostomia - thus recommend he drink plenty of thin water between meals.  Daughter states pt with cough baseline due to his pna and SLP advised he did NOT cough during SLP session unless he was drinking liquids - therefore highly suspicious of aspiration.    Will plan for diet changes to maximize comfort with and thus hopefully amount of intake  Pt agreeable to medication with puree- start and follow with liquid.   Offered [place] cup *bolus flow dependent* to which pt declined and daughter confirmed. Educated pt/daughter to recommendations for fully upright, small single boluses and aspiration precautions.   SLP will follow up next date.  Note pt for palliative meeting and code status changed to DNR today per RN.  Thanks for allowing me to help care for this pt.  Marland Kitchen   HPI HPI: 85 yo male adm to [facility]  09/23/18 with primary complaints of worsening cough. Chest x-ray showed worsening left lower lobe infiltrate. Recent hospitalization and discharge on 09/15/18. PMH significant for metastatic renal cell carcinoma status post nephrectomy, on immunotherapy with last cycle approximately 4 weeks ago, history of bipolar disorder, chronic kidney disease stage III, history of COPD, chronic  respiratory failure on home oxygen, diabetes mellitus type 2, essential hypertension. Admitted for Aspiration pneumonia with acute on chronic hypoxic respiratory failure.  Swallow evaluation ordered.  Pt denies dysphagia, admits to some difficulty with swallowing foods causing him to cough.        SLP Plan  Continue with current plan of care       Recommendations  Diet recommendations: Dysphagia 3 (mechanical soft);Nectar-thick liquid;Other(comment)(thin between meals ok) Liquids provided via: Cup;Straw Medication Administration: Whole meds with puree(start and follow with water) Supervision: Patient able to self feed;Full supervision/cueing for compensatory strategies Compensations: Minimize environmental distractions;Slow rate;Small sips/bites Postural Changes and/or Swallow Maneuvers: Seated upright 90 degrees;Upright 30-60 min after meal                Oral Care Recommendations: Oral care BID Follow up Recommendations: (tbd) SLP Visit Diagnosis: Dysphagia, oropharyngeal phase (R13.12);Dysphagia, unspecified (R13.10) Plan: Continue with current plan of care       GO                Macario Golds 09/24/2018, 1:43 PM  Luanna Salk, [facility] SLP Acute Rehab Services Pager [phone] Office [phone]

## 2018-09-24 NOTE — Consult Note (Signed)
Consultation Note Date: 09/24/2018   Patient Name: Blake Hines  DOB: 1931/04/19  MRN: [id number]  Age / Sex: 85 y.o., male  PCP: Blake Infante, MD Referring Physician: Patrecia Pour, MD  Reason for Consultation: Establishing goals of care and Psychosocial/spiritual support  HPI/Patient Profile: 85 y.o. male   admitted on 09/23/2018 with significant past medical history of metastatic renal cell carcinoma status post nephrectomy, on immunotherapy with last cycle approximately 4 weeks ago, history of bipolar disorder, chronic kidney disease stage III, history of COPD, chronic respiratory failure on home oxygen, diabetes mellitus type 2, essential hypertension who was last hospitalized here earlier this month and discharged on 09/15/2018.    At that time he was admitted due to dehydration and was noted to have hyponatremia.  He also had hypercalcemia and multifocal pneumonia.  He was found to have acute respiratory failure.  He was also found to have pulmonary embolism.  Patient was discharged on Lovenox as well as demeclocycline.  Calcium level had improved with treatment in the hospital.  He was doing well for the initial few days after coming from the hospital and then over the last 2 days he has been progressively getting weaker.  He developed a cough with yellowish expectoration occasionally blood-tinged.  His urine has been darker.  His oral intake has been poor.  He has not had any nausea vomiting.  The daughter did notice some coughing episodes when he would eat and drink.   In the emergency department patient was found to have worsening renal function.  Calcium level was actually improved from before.  Chest x-ray showed worsening left lower lobe infiltrate.    Admitted for treatment and stabilization  Patient and his family face treatment option decisions, advanced directive decisions and anticipatory care  needs.   Clinical Assessment and Goals of Care:  This NP Blake Hines reviewed medical records, received report from team, assessed the patient and then meet at the patient's bedside along with his daughter/Blake Hines  to discuss diagnosis, prognosis, GOC, EOL wishes disposition and options.  Concept of Hospice and Palliative Care were discussed  A detailed discussion was had today regarding advanced directives.  Concepts specific to code status, artifical feeding and hydration, continued IV antibiotics and rehospitalization was had.  The difference between a aggressive medical intervention path  and a palliative comfort care path for this patient at this time was had.  Values and goals of care important to patient and family were attempted to be elicited.  We discussed concept specific to human mortality and the limitations of medical interventions to prolong life when the body begins to fail to thrive.  We discussed quality over quantity of life.  MOST form re- introduced  Natural trajectory and expectations at EOL were discussed.  Questions and concerns addressed.   Family encouraged to call with questions or concerns.    PMT will continue to support holistically.    HCPOA   SUMMARY OF RECOMMENDATIONS    Code Status/Advance Care Planning:  DNR-documented today  Palliative Prophylaxis:   Aspiration, Bowel Regimen, Frequent Pain Assessment and Oral Care  Additional Recommendations (Limitations, Scope, Preferences):  Full Scope Treatment   Request oncology be consulted why patient is still here in the hospital --hoping for more information to base decisions for ongoing immunotherapy   Patient and his daughter are considering a shift to a more comfort/hospice approach.  Open to ongoing conversation with PMT to navigate healthcare decisions.  Psycho-social/Spiritual:   Desire for further Chaplaincy support:no-declined  Additional Recommendations: Education on  Hospice  Prognosis:   < 6 months- will clearly be impacted by desire for life prolonging measures.  Discharge Planning:   To Be Determined      Primary Diagnoses: Present on Admission: . Aspiration pneumonia ([place]) . AKI (acute kidney injury) ([place]) . Acute respiratory failure with hypoxia ([place]) . Bipolar disorder ([place]) . Metastatic renal cell carcinoma to lung, left ([facility]) . Hypercalcemia   I have reviewed the medical record, interviewed the patient and family, and examined the patient. The following aspects are pertinent.  Past Medical History:  Diagnosis Date  . Bipolar 1 disorder ([place])   . BPH (benign prostatic hyperplasia)   . Cancer ([place])    RCC  . Cataract   . Chronic kidney disease   . COPD (chronic obstructive pulmonary disease) ([place])   . Depression   . Diabetes mellitus without complication ([place])   . Hypertension    Social History   Socioeconomic History  . Marital status: Widowed    Spouse name: Not on file  . Number of children: Not on file  . Years of education: Not on file  . Highest education level: Not on file  Occupational History  . Not on file  Social Needs  . Financial resource strain: Not on file  . Food insecurity:    Worry: Not on file    Inability: Not on file  . Transportation needs:    Medical: Not on file    Non-medical: Not on file  Tobacco Use  . Smoking status: Former Smoker    Packs/day: 0.50    Years: 40.00    Pack years: 20.00    Types: Cigarettes    Last attempt to quit: 07/13/2003    Years since quitting: 15.2  . Smokeless tobacco: Never Used  Substance and Sexual Activity  . Alcohol use: Yes    Comment: 50 cc wild [place] daily  . Drug use: Not on file  . Sexual activity: Not on file  Lifestyle  . Physical activity:    Days per week: Not on file    Minutes per session: Not on file  . Stress: Not on file  Relationships  . Social connections:    Talks on phone: Not on file    Gets together: Not on file     Attends religious service: Not on file    Active member of club or organization: Not on file    Attends meetings of clubs or organizations: Not on file    Relationship status: Not on file  Other Topics Concern  . Not on file  Social History Narrative  . Not on file   History reviewed. No pertinent family history. Scheduled Meds: . amLODipine  5 mg Oral Daily  . demeclocycline  150 mg Oral BID  . enoxaparin (LOVENOX) injection  70 mg Subcutaneous Q24H  . insulin aspart  0-15 Units Subcutaneous TID WC  . latanoprost  1 drop Both Eyes QHS  . metoprolol tartrate  25 mg  Oral BID  . multivitamin with minerals  1 tablet Oral Daily  . OLANZapine  5 mg Oral QHS  . saccharomyces boulardii  250 mg Oral BID  . umeclidinium-vilanterol  1 puff Inhalation Daily   Continuous Infusions: . sodium chloride 75 mL/hr at 09/24/18 0640  . ampicillin-sulbactam (UNASYN) IV 3 g (09/23/18 2022)   PRN Meds:.acetaminophen **OR** acetaminophen, ondansetron **OR** ondansetron (ZOFRAN) IV Medications Prior to Admission:  Prior to Admission medications   Medication Sig Start Date End Date Taking? Authorizing Provider  amLODipine (NORVASC) 5 MG tablet Take 1 tablet (5 mg total) by mouth daily. 09/15/18  Yes [name], [name], MD  ANORO ELLIPTA 62.5-25 MCG/INH AEPB Inhale 1 puff into the lungs daily.  06/19/18  Yes [provider]  Coenzyme Q10 (COQ10) 200 MG CAPS Take 200 mg by mouth daily.    Yes [provider]  demeclocycline (DECLOMYCIN) 150 MG tablet Take 1 tablet (150 mg total) by mouth 2 (two) times daily. 09/15/18  Yes [name], [name], MD  enoxaparin (LOVENOX) 80 MG/0.8ML injection Inject 0.8 mLs (80 mg total) into the skin daily. 09/15/18  Yes [name], [name], MD  fenofibrate (TRICOR) 48 MG tablet Take 48 mg by mouth daily.   Yes [provider]  insulin lispro (HUMALOG KWIKPEN) 100 UNIT/ML KwikPen Inject 4 Units into the skin every evening. With dinner    Yes [provider]  latanoprost (XALATAN) 0.005 % ophthalmic solution Place 1 drop into both eyes at bedtime.   Yes [provider]  metoprolol tartrate (LOPRESSOR) 25 MG tablet Take 1 tablet (25 mg total) by mouth 2 (two) times daily. 09/15/18  Yes [name], [name], MD  Multiple Vitamins-Minerals (MULTIVITAMIN WITH MINERALS) tablet Take 1 tablet by mouth daily.   Yes [provider]  OLANZapine (ZYPREXA) 5 MG tablet Take 5 mg by mouth at bedtime.    Yes [provider]  senna-docusate (SENOKOT-S) 8.6-50 MG tablet Take 1 tablet by mouth 2 (two) times daily. For constipation 09/15/18  Yes [name], [name], MD  TOUJEO SOLOSTAR 300 UNIT/ML SOPN Inject 6 Units into the skin daily.  08/19/18  Yes [provider]  sitaGLIPtin (JANUVIA) 50 MG tablet Take 1 tablet (50 mg total) by mouth every morning. Hold until kidney function has normalized 09/15/18   [name], [name], MD   No Known Allergies Review of Systems  Neurological: Positive for weakness.    Physical Exam Constitutional:      Appearance: He is underweight. He is ill-appearing.     Interventions: Nasal cannula in place.  Cardiovascular:     Rate and Rhythm: Normal rate and regular rhythm.     Heart sounds: Normal heart sounds.  Pulmonary:     Effort: Pulmonary effort is normal.     Breath sounds: Decreased breath sounds present.  Skin:    General: Skin is warm and dry.  Neurological:     Mental Status: He is alert.     Vital Signs: BP (!) 149/81 (BP Location: Right Arm)   Pulse 96   Temp (!) 97.5 F (36.4 C) (Oral)   Resp 19   Ht 5\' 8"  (1.727 m)   Wt 73.7 kg   SpO2 96%   BMI 24.71 kg/m  Pain Scale: 0-10   Pain Score: Asleep   SpO2: SpO2: 96 % O2 Device:SpO2: 96 % O2 Flow Rate: .O2 Flow Rate (L/min): 3 L/min  IO: Intake/output summary:   Intake/Output Summary (Last 24 hours) at 09/24/2018  0830 Last data filed at 09/24/2018 0540 Gross per 24 hour  Intake 597.28 ml  Output 1750 ml  Net -1152.72 ml     LBM: Last BM Date: 09/23/18 Baseline Weight: Weight: 73.7 kg Most recent weight: Weight: 73.7 kg     Palliative Assessment/Data: 30 %   Discussed with Dr Bonner Puna  Time In: 0715 Time Out: 0830 Time Total: 75 minutes Greater than 50%  of this time was spent counseling and coordinating care related to the above assessment and plan.  Signed by: Blake Lessen, NP   Please contact Palliative Medicine Team phone at [phone] for questions and concerns.  For individual provider: See Shea Evans

## 2018-09-24 NOTE — Progress Notes (Signed)
COMMUNITY PALLIATIVE CARE SW NOTE  PATIENT NAME: Blake Hines DOB: Jan 23, 1931 MRN: [id number]  PRIMARY CARE PROVIDER: Crist Infante, MD  RESPONSIBLE PARTY:  Acct ID - Guarantor Home Phone Work Phone Relationship Acct Type  [id number] Eden [id number]  Self P/F     [address], [place], [place] [address]     PLAN OF CARE and INTERVENTIONS:             1. GOALS OF CARE/ ADVANCE CARE PLANNING:  Goal is for patient to remain in his home.  He wishes to continue therapy.  Patient is a DNR. 2. SOCIAL/EMOTIONAL/SPIRITUAL ASSESSMENT/ INTERVENTIONS:  SW and Palliative Care RN, Blake Hines, met with patient, his daughter, Blake Hines daughter, Blake Hines, and her husband, and patient's son.  Patient's son has severe mental illness and did not engage in the conversation.  Blake Hines provided extensive information regarding patient's illness.  He lives at home alone and has been very active until a few weeks ago.  Patient answered questions appropriately.  He stated he was not ready to stop his physical therapy.  Blake Hines stated she will remain with patient indefinitely. 3. PATIENT/CAREGIVER EDUCATION/ COPING:  SW provided education to patient and his family regarding the Palliative Care program and what to expect in the future.  They stated they understood. 4. PERSONAL EMERGENCY PLAN:  Family to taking turns staying with patient.  They will contact EMS if needed. 5. COMMUNITY RESOURCES COORDINATION/ HEALTH CARE NAVIGATION:  [facility] nurse was present during visit and drew blood for labs. 6. FINANCIAL/LEGAL CONCERNS/INTERVENTIONS:  None per family.     SOCIAL HX:  Social History   Tobacco Use  . Smoking status: Former Smoker    Packs/day: 0.50    Years: 40.00    Pack years: 20.00    Types: Cigarettes    Last attempt to quit: 07/13/2003    Years since quitting: 15.2  . Smokeless tobacco: Never Used  Substance Use Topics  . Alcohol use: Yes    Comment: 50 cc wild [place] daily     CODE STATUS:  DNR  ADVANCED DIRECTIVES:  HCPOA and LW MOST FORM COMPLETE:  Daughter has a copy to review. HOSPICE EDUCATION PROVIDED:  Patient and family are familiar with Hospice because his wife received Hospice care 15 years ago. PPS:  Blake Hines reports patient's appetite has decreased.  He is currently remaining in his bed. Duration of visit and documentation:  120 minutes.      Blake Corn Chimere Klingensmith, LCSW

## 2018-09-24 NOTE — Progress Notes (Addendum)
[facility] INPATIENT PROGRESS NOTE  DIAGNOSIS: Metastatic renal cell carcinoma in December 2019 that was initially diagnosed as a stage Ia (T1 a, N0, M0) in August 2008status post left nephrectomy.He currently present with numerous bilateral pulmonary nodules in addition to large mediastinal lymphadenopathy and metastatic left adrenal mass.  PRIOR THERAPY: None.  CURRENT THERAPY: Palliative treatment with immunotherapy with a combination of ipilimumab 1 mg/KG and nivolumab 3 mg/KG every 3 weeks for 4 cycles followed by maintenance treatment with nivolumab if the patient has no evidence for disease progression after cycle #4.  He is status post 2 cycles of the combination treatment. Last treatment was given on 08/16/2018.  SUBJECTIVE: Blake Hines 85 y.o. male with metastatic renal cell carcinoma followed by Dr. Julien Nordmann.  The patient is been receiving treatment with immunotherapy consisting of ipilimumab and nivolumab every.  He completed his second cycle on 08/16/2018.  His third cycle of treatment has been delayed secondary to a recent hospitalization for hyponatremia.  He was rescheduled for his next cycle of treatment later this week.  The patient was brought to the emergency room due to worsening cough for the past few days.  He was noted to be dehydrated and have ongoing hyponatremia and hypercalcemia.  He was found to have a left lower lobe infiltrate on chest x-ray in the emergency room and was admitted for IV antibiotics and IV hydration.  When seen today, the patient is in the room by himself.  He has no specific complaints.  He denied having any fevers or chills.  No chest discomfort.  Reports nonproductive cough but no shortness of breath.  No hemoptysis.  Denies nausea, vomiting, constipation, diarrhea.  No skin rashes.  ALLERGIES:  has No Known Allergies.  MEDICATIONS:  Current Facility-Administered Medications  Medication Dose Route Frequency Provider Last Rate  Last Dose  . 0.45 % sodium chloride infusion   Intravenous Continuous Bonnielee Haff, MD 75 mL/hr at 09/24/18 0640    . acetaminophen (TYLENOL) tablet 650 mg  650 mg Oral Q6H PRN Bonnielee Haff, MD   650 mg at 09/24/18 0645   Or  . acetaminophen (TYLENOL) suppository 650 mg  650 mg Rectal Q6H PRN Bonnielee Haff, MD      . amLODipine (NORVASC) tablet 5 mg  5 mg Oral Daily Bonnielee Haff, MD      . Ampicillin-Sulbactam (UNASYN) 3 g in sodium chloride 0.9 % 100 mL IVPB  3 g Intravenous Q12H Shade, Christine E, RPH 200 mL/hr at 09/23/18 2022 3 g at 09/23/18 2022  . demeclocycline (DECLOMYCIN) tablet 150 mg  150 mg Oral BID Bonnielee Haff, MD   150 mg at 09/23/18 2127  . enoxaparin (LOVENOX) injection 70 mg  70 mg Subcutaneous Q24H Randa Spike, RPH   70 mg at 09/23/18 2126  . insulin aspart (novoLOG) injection 0-15 Units  0-15 Units Subcutaneous TID WC Bonnielee Haff, MD   2 Units at 09/24/18 0749  . latanoprost (XALATAN) 0.005 % ophthalmic solution 1 drop  1 drop Both Eyes QHS Bonnielee Haff, MD   1 drop at 09/23/18 2127  . metoprolol tartrate (LOPRESSOR) tablet 25 mg  25 mg Oral BID Bonnielee Haff, MD      . multivitamin with minerals tablet 1 tablet  1 tablet Oral Daily Bonnielee Haff, MD   1 tablet at 09/23/18 1711  . OLANZapine (ZYPREXA) tablet 5 mg  5 mg Oral QHS Bonnielee Haff, MD   5 mg at 09/23/18 2126  . ondansetron (  ZOFRAN) tablet 4 mg  4 mg Oral Q6H PRN Bonnielee Haff, MD       Or  . ondansetron [facility]) injection 4 mg  4 mg Intravenous Q6H PRN Bonnielee Haff, MD      . saccharomyces boulardii (FLORASTOR) capsule 250 mg  250 mg Oral BID Bonnielee Haff, MD   250 mg at 09/23/18 1711  . umeclidinium-vilanterol (ANORO ELLIPTA) 62.5-25 MCG/INH 1 puff  1 puff Inhalation Daily Bonnielee Haff, MD   1 puff at 09/23/18 1711    REVIEW OF SYSTEMS:   Review of Systems  Review of Systems  Constitutional: Positive for malaise/fatigue. Negative for chills and fever.  HENT:  Negative.   Eyes: Negative.   Respiratory: Positive for cough. Negative for hemoptysis and shortness of breath.   Cardiovascular: Negative.   Gastrointestinal: Negative.   Genitourinary: Negative.   Musculoskeletal: Negative.   Skin: Negative.   Neurological: Negative.   Endo/Heme/Allergies: Negative.   Psychiatric/Behavioral: Negative.       PHYSICAL EXAMINATION:  Vital signs in last 24 hours: Temp:  [97.5 F (36.4 C)-98 F (36.7 C)] 97.5 F (36.4 C) (02/24 0539) Pulse Rate:  [96-104] 96 (02/24 0539) Resp:  [18-31] 19 (02/24 0539) BP: (108-149)/(56-85) 149/81 (02/24 0539) SpO2:  [93 %-97 %] 96 % (02/24 0539) Weight:  [162 lb 8 oz (73.7 kg)] 162 lb 8 oz (73.7 kg) (02/23 1606) Weight change:  Last BM Date: 09/23/18  ECOG PERFORMANCE STATUS: 1 - Symptomatic but completely ambulatory  Intake/Output from previous day: 02/23 0701 - 02/24 0700 In: 597.3 [I.V.:47.3; IV Piggyback:550] Out: 2353 [Urine:1750] General: Alert, awake without distress. Head: Normocephalic atraumatic. Mouth: mucous membranes moist, pharynx normal without lesions Eyes: No scleral icterus.  Pupils are equal and round reactive to light. Resp: Rales to the left base otherwise clear. Cardio: regular rate and rhythm, S1, S2 normal, no murmur, click, rub or gallop GI: soft, non-tender; bowel sounds normal; no masses,  no organomegaly Musculoskeletal: No joint deformity or effusion. Neurological: No motor, sensory deficits.  Intact deep tendon reflexes. Skin: No rashes or lesions.   LABORATORY DATA: Lab Results  Component Value Date   WBC 9.9 09/24/2018   HGB 11.5 (L) 09/24/2018   HCT 37.3 (L) 09/24/2018   MCV 88.6 09/24/2018   PLT 334 09/24/2018    CMP Latest Ref Rng & Units 09/24/2018 09/23/2018 09/15/2018  Glucose 70 - 99 mg/dL 139(H) 138(H) 176(H)  BUN 8 - 23 mg/dL 38(H) 39(H) 53(H)  Creatinine 0.61 - 1.24 mg/dL 2.52(H) 2.59(H) 1.83(H)  Sodium 135 - 145 mmol/L 130(L) 132(L) 138  Potassium 3.5 -  5.1 mmol/L 4.4 4.1 4.7  Chloride 98 - 111 mmol/L 94(L) 95(L) 98  CO2 22 - 32 mmol/L 28 31 30   Calcium 8.9 - 10.3 mg/dL 11.2(H) 11.6(H) 12.9(H)  Total Protein 6.5 - 8.1 g/dL 5.6(L) 6.0(L) -  Total Bilirubin 0.3 - 1.2 mg/dL 0.7 0.3 -  Alkaline Phos 38 - 126 U/L 55 56 -  AST 15 - 41 U/L 29 39 -  ALT 0 - 44 U/L 67(H) 80(H) -     RADIOGRAPHIC STUDIES:  Dg Chest 2 View  Result Date: 09/23/2018 CLINICAL DATA:  Hemoptysis. Renal cell cancer. EXAM: CHEST - 2 VIEW COMPARISON:  Chest CT 09/08/2018 FINDINGS: Cardiomediastinal silhouette is normal. Mediastinal contours appear intact. Calcific atherosclerotic disease and tortuosity of the aorta. Prominence of the hilar regions, likely due to lymphadenopathy or hilar/subhilar masses. Low lung volumes with increasing left lower lobe patchy airspace consolidation. Bilateral small  pleural effusions. Known metastatic pulmonary nodules not well seen radiographically. Osseous structures are without acute abnormality. Soft tissues are grossly normal. IMPRESSION: 1. Low lung volumes with increasing left lower lobe patchy airspace consolidation. 2. Stable right lower lobe patchy airspace consolidation. 3. Bilateral small pleural effusions. 4. Prominence of the hilar regions, likely due to lymphadenopathy or hilar/subhilar masses. Electronically Signed   By: Fidela Salisbury M.D.   On: 09/23/2018 10:43   Dg Chest 2 View  Result Date: 09/04/2018 CLINICAL DATA:  Shortness of breath EXAM: CHEST - 2 VIEW COMPARISON:  08/03/2018 FINDINGS: Patchy bilateral lower lobe airspace opacities are new since prior study concerning for pneumonia. Heart is normal size. No effusions or pneumothorax. No acute bony abnormality. IMPRESSION: Patchy bilateral lower lobe airspace opacities concerning for pneumonia. Electronically Signed   By: Rolm Baptise M.D.   On: 09/04/2018 10:24   Ct Angio Chest Pe W Or Wo Contrast  Result Date: 09/08/2018 CLINICAL DATA:  History of metastatic renal  cell carcinoma. EXAM: CT ANGIOGRAPHY CHEST WITH CONTRAST TECHNIQUE: Multidetector CT imaging of the chest was performed using the standard protocol during bolus administration of intravenous contrast. Multiplanar CT image reconstructions and MIPs were obtained to evaluate the vascular anatomy. CONTRAST:  36mL ISOVUE-370 IOPAMIDOL (ISOVUE-370) INJECTION 76% COMPARISON:  PET-CT 08/10/2018 FINDINGS: Cardiovascular: Normal heart size. Small pericardial effusion. Aorta and main pulmonary artery normal in caliber. Thoracic aortic vascular calcifications. Nonocclusive embolus demonstrated within the distal right lower lobe pulmonary artery (image 164; series 5). Additionally embolus is demonstrated within the right middle lobe segmental and subsegmental pulmonary arteries. Small amount of embolic material demonstrated within the right upper lobe pulmonary arteries. RV/LV ratio less than 1. Mediastinum/Nodes: Re demonstrated extensive mediastinal and hilar adenopathy. Slight interval increase in size of subcarinal lymph node measuring 4.7 cm (image 53; series 4), previously 4.2 cm. Similar-appearing 2.5 cm left inferior hilar lymph node (image 65; series 4), previously 2.5 cm. Interval increase in size of left hilar lymph node measuring 1.5 cm (image 53; series 4), previously 1.0 cm. Lungs/Pleura: Similar-appearing scattered pulmonary nodules. Interval development of masslike area of consolidation within the left lower lobe measuring 6.1 x 3.9 cm. Additionally there is new predominately subpleural ground-glass and consolidative opacities throughout the left lung and right lower lobe. Upper Abdomen: No acute process. Musculoskeletal: Thoracic spine degenerative changes. No aggressive or acute appearing osseous lesions identified. Review of the MIP images confirms the above findings. IMPRESSION: 1. Findings positive for pulmonary embolus predominately within the right middle and right lower lobes as well as right upper lobe.  2. Interval development of a large (6 cm) masslike area of consolidation within the left lower lobe. Additionally there is new patchy predominately subpleural ground-glass and consolidative opacities throughout the left lung and right lower lobe. Overall findings are concerning for multifocal infectious process. Possibility of superimposed edema not excluded. 3. Interval increase in size of mediastinal and hilar adenopathy. 4. Re demonstrated scattered pulmonary nodules compatible with known metastatic disease. 5. Critical Value/emergent results were called by telephone at the time of interpretation on 09/08/2018 at 6:54 pm to Dr. Vernell Leep , who verbally acknowledged these results. Electronically Signed   By: Lovey Newcomer M.D.   On: 09/08/2018 18:56   Nm Pulmonary Perf And Vent  Result Date: 09/07/2018 CLINICAL DATA:  Stage IV renal cell carcinoma. Elevated D-dimer. Clinical concern for pulmonary embolism. COPD. EXAM: NUCLEAR MEDICINE VENTILATION - PERFUSION LUNG SCAN TECHNIQUE: Ventilation images were obtained in multiple projections using inhaled aerosol  Tc-58m DTPA. Perfusion images were obtained in multiple projections after intravenous injection of Tc-57m MAA. RADIOPHARMACEUTICALS:  30.2 mCi of Tc-55m DTPA aerosol inhalation and 4.2 mCi Tc11m MAA IV COMPARISON:  09/06/2018 chest radiograph. FINDINGS: Ventilation: Limited heterogeneous radiotracer throughout both lungs on the ventilation images, with central airway radiotracer accumulation. Perfusion: Numerous large matched segmental perfusion defects throughout both lungs, including within the lower lung zones. IMPRESSION: Intermediate probability for pulmonary embolism (20-79%) by PIOPED II criteria. Electronically Signed   By: Ilona Sorrel M.D.   On: 09/07/2018 23:23   Dg Chest Port 1 View  Result Date: 09/06/2018 CLINICAL DATA:  Shortness of breath EXAM: PORTABLE CHEST 1 VIEW COMPARISON:  Two days ago FINDINGS: Asymmetric left lower lung opacity  compatible with pneumonia. Pulmonary metastatic disease based on recent PET-CT. COPD. Normal heart size. Aortic tortuosity. IMPRESSION: 1. Unchanged asymmetric left lower pulmonary opacity primarily concerning for pneumonia. 2. Pulmonary metastatic disease. Electronically Signed   By: Monte Fantasia M.D.   On: 09/06/2018 10:31   Vas [place] Lower Extremity Venous (dvt)  Result Date: 09/08/2018  Lower Venous Study Indications: Pulmonary embolism.  Performing Technologist: Abram Sander RVS  Examination Guidelines: A complete evaluation includes B-mode imaging, spectral Doppler, color Doppler, and power Doppler as needed of all accessible portions of each vessel. Bilateral testing is considered an integral part of a complete examination. Limited examinations for reoccurring indications may be performed as noted.  Right Venous Findings: +---------+---------------+---------+-----------+----------+-------+          CompressibilityPhasicitySpontaneityPropertiesSummary +---------+---------------+---------+-----------+----------+-------+ CFV      Full           Yes      Yes                          +---------+---------------+---------+-----------+----------+-------+ SFJ      Full                                                 +---------+---------------+---------+-----------+----------+-------+ FV Prox  Full                                                 +---------+---------------+---------+-----------+----------+-------+ FV Mid   Full                                                 +---------+---------------+---------+-----------+----------+-------+ FV DistalFull                                                 +---------+---------------+---------+-----------+----------+-------+ PFV      Full                                                 +---------+---------------+---------+-----------+----------+-------+ POP      Full           Yes  Yes                           +---------+---------------+---------+-----------+----------+-------+ PTV      Full                                                 +---------+---------------+---------+-----------+----------+-------+ PERO     Full                                                 +---------+---------------+---------+-----------+----------+-------+  Left Venous Findings: +---------+---------------+---------+-----------+----------+-------+          CompressibilityPhasicitySpontaneityPropertiesSummary +---------+---------------+---------+-----------+----------+-------+ CFV      Full           Yes      Yes                          +---------+---------------+---------+-----------+----------+-------+ SFJ      Full                                                 +---------+---------------+---------+-----------+----------+-------+ FV Prox  Full                                                 +---------+---------------+---------+-----------+----------+-------+ FV Mid   Full                                                 +---------+---------------+---------+-----------+----------+-------+ FV DistalFull                                                 +---------+---------------+---------+-----------+----------+-------+ PFV      Full                                                 +---------+---------------+---------+-----------+----------+-------+ POP      Full           Yes      Yes                          +---------+---------------+---------+-----------+----------+-------+ PTV      Full                                                 +---------+---------------+---------+-----------+----------+-------+ PERO     Full                                                 +---------+---------------+---------+-----------+----------+-------+  Summary: Right: There is no evidence of deep vein thrombosis in the lower extremity. No cystic structure found in the popliteal  fossa. Left: There is no evidence of deep vein thrombosis in the lower extremity. No cystic structure found in the popliteal fossa.  *See table(s) above for measurements and observations. Electronically signed by Monica Martinez MD on 09/08/2018 at 10:29:12 AM.    Final      ASSESSMENT/PLAN:  This is a very pleasant 85 year old white male with metastatic renal cell carcinoma.  The patient is currently undergoing treatment with immunotherapy with ipilimumab and nivolumab status post 2 cycles.  He has been tolerating his treatment well overall.  His third cycle has been delayed due to recent hospitalization for hyponatremia and now subsequent hospitalization for pneumonia.  1.  Metastatic renal cell carcinoma: He is scheduled to receive his third cycle of treatment later this week but will likely need to delay this.    2.  Aspiration pneumonia: Continue Unasyn, vancomycin, and Zosyn for his possible aspiration pneumonia.  Speech therapy has been consulted.  3. Anemia: He has mild anemia likely related to his renal insufficiency.  Continue to monitor.  4.  Hyponatremia secondary to SIADH: Sodium level has been relatively stable. Continue the demeclocycline.  5.  Hypercalcemia: Calcium level is trending downward.  He received Aredia on 09/12/2018.  He also received subcutaneous calcitonin during his last hospitalization.  Continue IV hydration.  6.  Acute kidney injury: Likely secondary to dehydration.  Creatinine is slightly improved today with IV hydration.  7.  Acute pulmonary embolism: Continue Lovenox.    LOS: 1 day   Mikey Bussing, DNP, AGPCNP-BC, AOCNP 09/24/18   ADDENDUM: Hematology/Oncology Attending: The patient seen and examined today.  His daughter was at the bedside.  I agree with the above note.  This is a very pleasant 85 years old white male with metastatic renal cell carcinoma status post 2 doses of treatment with immunotherapy with ipilimumab and nivolumab.  He tolerated  the treatment well but over the last few weeks he was admitted to the hospital twice.  The first admission was for electrolyte imbalance with hyponatremia and hypercalcemia.  He was on fluid restriction and received treatment with Aredia and calcitonin for the hypercalcemia. The patient was recently admitted with questionable pneumonia. I had a lengthy discussion with the patient and his daughter today about his current condition and future treatment options. I think the patient has concerning deterioration in his general performance status and he may not be able to continue with any further systemic treatment at least at this point. I strongly recommended for them to consider palliative care and hospice after discharge from the hospital.  He will continue his current treatment for pneumonia as well as IV hydration for the hypercalcemia and hyponatremia. Thank you for taking good care of Mr. Blake Hines, I will continue to follow up the patient with you and assist in his management on as-needed basis.  Disclaimer: This note was dictated with voice recognition software. Similar sounding words can inadvertently be transcribed and may be missed upon review. Eilleen Kempf, MD

## 2018-09-24 NOTE — Progress Notes (Signed)
PROGRESS NOTE  MALICK NETZ  NWG:[id number] DOB: 07-25-1931 DOA: 09/23/2018 PCP: Crist Infante, MD  Outpatient Specialists: Oncology, Dr. Julien Nordmann Brief Narrative: Blake Hines is an 85 y.o. male with a history of metastatic RCC s/p nephrectomy on immunotherapy, stage III CKD, COPD on home oxygen, PE recently started on lovenox, hyponatremia on democlocycline, hypercalcemia, T2DM, HTN, and bipolar disorder who presented due to increased cough productive of yellow sputum and weakness, also with his daughter having noticed coughing with liquid administration. He had been doing well following discharge on 2/15 (admitted for dehydration, found to be hyponatremic, hypercalcemic, and with PE), but symptoms progressed quickly over 2 days prompting ED visit. His daughter also reports paranoia developing at home. His psychiatrist was to make a home visit on the day of arrival, but his symptoms prompted ED visit on 2/23 where creatinine was elevated, and CXR showed worsening left lower lobe infiltrate concerning for aspiration pneumonia. Zosyn and speech therapy consultation were ordered, and he was admitted with IV fluids. Psychiatry and palliative care also consulted and the patient's oncologist, Dr. Julien Nordmann, was contacted for assessment.  Assessment & Plan: Principal Problem:   Aspiration pneumonia ([place]) Active Problems:   Metastatic renal cell carcinoma to lung, left (HCC)   AKI (acute kidney injury) ([place])   Type 2 diabetes mellitus without complication (HCC)   Bipolar disorder (HCC)   Acute respiratory failure with hypoxia (HCC)   Hypercalcemia  Acute on chronic hypoxic respiratory failure due to aspiration pneumonia in patient with COPD: BNP, troponin, flu all normal/negative. - Continue 4L O2 if needed to maintain SpO2 >90%.  - SLP evaluation - Continue unasyn empirically.  - Sputum culture - Aspiration precautions - Continue bronchodilators, anoro ellipta  Acute renal failure on stage III CKD  in pt s/p nephrectomy: Stable impairment this AM. - Continue IV fluids, avoid hypotension and contrast - Monitor BMP  Metastatic renal cell CA s/p nephrectomy on immunotherapy:  - Due soon to continue immunotherapy, though this needs to be delayed.  - I spoke with the patient's oncologist, Dr. Julien Nordmann, for recommendations. He reports he was aware of the admission and notified rounding NP. Family is considering scope of interventions in balance with quality of life. Palliative care assisting with this and confirmed DNR this morning.  Bipolar disorder: with recent suicidality and paranoid features per his daughter at bedside. Suspect symptoms worsened with discontinuation of zoloft.  - Psychiatry consulted for assistance with management. - Continuing zyprexa for now.  T2DM: HbA1c 7.6% - Continue mod SSI and CBG AC/HS. Noted to be on toujeo 6u daily, may restart qHS if glucose runs high.  Hyponatremia, SIADH: Na improved, DC'ed ARB, HCTZ, SSRI at recent admission - Continue democlocycline   PE:  - Continue lovenox, which was chosen to decrease pill burden  HTN:  - Continue metoprolol and norvasc (substituted for ARB/HCTZ at recent admission)  Hypercalcemia of malignancy: Improved from previous. s/p Aredia 2/12. - Monitor  Anemia of chronic disease:  - Monitor hgb  DVT prophylaxis: Treatment dose lovenox Code Status: DNR Family Communication: Daughter at bedside Disposition Plan: Uncertain, will get PT/OT input. SNF was recommended at last discharge.   Consultants:   Psychiatry  Palliative care medicine  Oncology  Procedures:   None  Antimicrobials:  Unasyn   Subjective: Feels tired, didn't sleep at all last night. Daughter at bedside says this happens in the hospital, and he's not sleeping well at home for the past week either. He denies shortness of breath or  chest pain, but does have a cough.   Objective: Vitals:   09/23/18 1606 09/23/18 2103 09/24/18 0539  09/24/18 0941  BP:  137/85 (!) 149/81   Pulse:  97 96   Resp:  20 19   Temp:  97.6 F (36.4 C) (!) 97.5 F (36.4 C)   TempSrc:  Oral Oral   SpO2:  97% 96% 92%  Weight: 73.7 kg     Height: 5\' 8"  (1.727 m)       Intake/Output Summary (Last 24 hours) at 09/24/2018 1320 Last data filed at 09/24/2018 0540 Gross per 24 hour  Intake 47.28 ml  Output 1750 ml  Net -1702.72 ml   Filed Weights   09/23/18 1606  Weight: 73.7 kg    Gen: Frail elderly male in no distress Pulm: Non-labored breathing 4LPM. Diminished LLL.  CV: Regular borderline tachycardia. No murmur, rub, or gallop. No JVD, trace pedal edema. GI: Abdomen soft, non-tender, non-distended, with normoactive bowel sounds. No organomegaly or masses felt. Ext: Warm, no deformities Skin: No rashes, lesions or ulcers Neuro: Drowsy. No focal neurological deficits. Psych: Judgement and insight appear limited. Mood & affect appropriate.   Data Reviewed: I have personally reviewed following labs and imaging studies  CBC: Recent Labs  Lab 09/23/18 0924 09/24/18 0659  WBC 10.2 9.9  NEUTROABS 7.2  --   HGB 11.9* 11.5*  HCT 37.4* 37.3*  MCV 88.4 88.6  PLT 333 546   Basic Metabolic Panel: Recent Labs  Lab 09/23/18 0924 09/24/18 0659  NA 132* 130*  K 4.1 4.4  CL 95* 94*  CO2 31 28  GLUCOSE 138* 139*  BUN 39* 38*  CREATININE 2.59* 2.52*  CALCIUM 11.6* 11.2*  MG 1.7  --    GFR: Estimated Creatinine Clearance: 20.7 mL/min (A) (by C-G formula based on SCr of 2.52 mg/dL (H)). Liver Function Tests: Recent Labs  Lab 09/23/18 0924 09/24/18 0659  AST 39 29  ALT 80* 67*  ALKPHOS 56 55  BILITOT 0.3 0.7  PROT 6.0* 5.6*  ALBUMIN 2.5* 2.4*   No results for input(s): LIPASE, AMYLASE in the last 168 hours. No results for input(s): AMMONIA in the last 168 hours. Coagulation Profile: No results for input(s): INR, PROTIME in the last 168 hours. Cardiac Enzymes: Recent Labs  Lab 09/23/18 0924  TROPONINI <0.03   BNP  (last 3 results) No results for input(s): PROBNP in the last 8760 hours. HbA1C: Recent Labs    09/24/18 0659  HGBA1C 7.6*   CBG: Recent Labs  Lab 09/23/18 1641 09/23/18 2107 09/24/18 0734 09/24/18 1119  GLUCAP 148* 263* 133* 247*   Lipid Profile: No results for input(s): CHOL, HDL, LDLCALC, TRIG, CHOLHDL, LDLDIRECT in the last 72 hours. Thyroid Function Tests: No results for input(s): TSH, T4TOTAL, FREET4, T3FREE, THYROIDAB in the last 72 hours. Anemia Panel: No results for input(s): VITAMINB12, FOLATE, FERRITIN, TIBC, IRON, RETICCTPCT in the last 72 hours. Urine analysis:    Component Value Date/Time   COLORURINE YELLOW 09/23/2018 0925   APPEARANCEUR CLEAR 09/23/2018 0925   LABSPEC 1.006 09/23/2018 0925   PHURINE 7.0 09/23/2018 0925   GLUCOSEU NEGATIVE 09/23/2018 0925   HGBUR NEGATIVE 09/23/2018 0925   BILIRUBINUR NEGATIVE 09/23/2018 0925   KETONESUR NEGATIVE 09/23/2018 0925   PROTEINUR NEGATIVE 09/23/2018 0925   NITRITE NEGATIVE 09/23/2018 0925   LEUKOCYTESUR TRACE (A) 09/23/2018 0925   No results found for this or any previous visit (from the past 240 hour(s)).    Radiology Studies: Dg Chest 2  View  Result Date: 09/23/2018 CLINICAL DATA:  Hemoptysis. Renal cell cancer. EXAM: CHEST - 2 VIEW COMPARISON:  Chest CT 09/08/2018 FINDINGS: Cardiomediastinal silhouette is normal. Mediastinal contours appear intact. Calcific atherosclerotic disease and tortuosity of the aorta. Prominence of the hilar regions, likely due to lymphadenopathy or hilar/subhilar masses. Low lung volumes with increasing left lower lobe patchy airspace consolidation. Bilateral small pleural effusions. Known metastatic pulmonary nodules not well seen radiographically. Osseous structures are without acute abnormality. Soft tissues are grossly normal. IMPRESSION: 1. Low lung volumes with increasing left lower lobe patchy airspace consolidation. 2. Stable right lower lobe patchy airspace consolidation. 3.  Bilateral small pleural effusions. 4. Prominence of the hilar regions, likely due to lymphadenopathy or hilar/subhilar masses. Electronically Signed   By: Fidela Salisbury M.D.   On: 09/23/2018 10:43    Scheduled Meds: . amLODipine  5 mg Oral Daily  . demeclocycline  150 mg Oral BID  . enoxaparin (LOVENOX) injection  70 mg Subcutaneous Q24H  . insulin aspart  0-15 Units Subcutaneous TID WC  . latanoprost  1 drop Both Eyes QHS  . metoprolol tartrate  25 mg Oral BID  . multivitamin with minerals  1 tablet Oral Daily  . OLANZapine  5 mg Oral QHS  . saccharomyces boulardii  250 mg Oral BID  . umeclidinium-vilanterol  1 puff Inhalation Daily   Continuous Infusions: . sodium chloride 75 mL/hr at 09/24/18 0640  . ampicillin-sulbactam (UNASYN) IV 3 g (09/24/18 0929)     LOS: 1 day   Time spent: 35 minutes.  Patrecia Pour, MD [facility] [email or web address] Password [email or web address] 09/24/2018, 1:20 PM

## 2018-09-24 NOTE — Evaluation (Signed)
Physical Therapy Evaluation Patient Details Name: Blake Hines MRN: [id number] DOB: Aug 03, 1930 Today's Date: 09/24/2018   History of Present Illness  Patient is an 85 y/o male presenting to the ED on 09/23/18 with primary complaints of worsening cough. Chest x-ray showed worsening left lower lobe infiltrate. Recent hospitalization and discharge on 09/15/18. PMH significant for metastatic renal cell carcinoma status post nephrectomy, on immunotherapy with last cycle approximately 4 weeks ago, history of bipolar disorder, chronic kidney disease stage III, history of COPD, chronic respiratory failure on home oxygen, diabetes mellitus type 2, essential hypertension. Admitted for Aspiration pneumonia with acute on chronic hypoxic respiratory failure.    Clinical Impression  Patient is a very pleasant 85 y/o male admitted with the above listed diagnosis. Following most recent admission, patients daughter has been present to assist with ADLs and meals with patient only ambulating short household distances with RW. Patient today limited by endurance with noted increase in HR to 130 and SpO2 to 88% on 3L with bed level mobility and transfer to recliner. Feel safest option for d/c would be SNF, however family may consider palliative consult and d/c to ALF in [place]. PT to continue to follow to progress safe functional mobility.     Follow Up Recommendations SNF    Equipment Recommendations  None recommended by PT    Recommendations for Other Services       Precautions / Restrictions Precautions Precautions: Fall Precaution Comments: monitor o2 sats and HR, HOH Restrictions Weight Bearing Restrictions: No      Mobility  Bed Mobility Overal bed mobility: Needs Assistance Bed Mobility: Supine to Sit     Supine to sit: Min guard;Min assist     General bed mobility comments: increased time and effort; cueing for breathing patterns with patient able to follow commands  Transfers Overall  transfer level: Needs assistance Equipment used: Rolling walker (2 wheeled);2 person hand held assist Transfers: Sit to/from [facility] Sit to Stand: Min assist;+2 physical assistance Stand pivot transfers: Min assist;+2 physical assistance;+2 safety/equipment       General transfer comment: MIn A+2 to stand from bedside; cueing for safety and sequencing; tendency for posterior weight shift with B feet in DF; with pivot requires cueing and assist for RW management for safety and stability  Ambulation/Gait             General Gait Details: deferred due to high HR and drop in O2 with transfer  Stairs            Wheelchair Mobility    Modified Rankin (Stroke Patients Only)       Balance Overall balance assessment: Needs assistance Sitting-balance support: Feet supported Sitting balance-Leahy Scale: Fair     Standing balance support: Bilateral upper extremity supported;During functional activity Standing balance-Leahy Scale: Poor Standing balance comment: UE support for balance                             Pertinent Vitals/Pain Pain Assessment: No/denies pain    Home Living Family/patient expects to be discharged to:: Private residence Living Arrangements: Alone(daughter can provide assist) Available Help at Discharge: Family;Available PRN/intermittently;Available 24 hours/day Type of Home: House Home Access: Stairs to enter   [employer] of Steps: 1 then 2 Home Layout: Two level Home Equipment: Walker - 2 wheels;Cane - single point;Bedside commode Additional Comments: bed/bath upstairs - has been living in dining room    Prior Function Level of Independence:  Needs assistance   Gait / Transfers Assistance Needed: use of RW for limited household mobility  ADL's / Homemaking Assistance Needed: daughter assist with ADLs, meals        Hand Dominance        Extremity/Trunk Assessment   Upper Extremity  Assessment Upper Extremity Assessment: Defer to OT evaluation    Lower Extremity Assessment Lower Extremity Assessment: Generalized weakness    Cervical / Trunk Assessment Cervical / Trunk Assessment: Normal  Communication   Communication: HOH  Cognition Arousal/Alertness: Awake/alert Behavior During Therapy: WFL for tasks assessed/performed Overall Cognitive Status: Within Functional Limits for tasks assessed                                        General Comments General comments (skin integrity, edema, etc.): family present and supportive; patient has apartment at [facility] on hold in [place] - family considering palliative care/HH services vs ALF at discharge    Exercises     Assessment/Plan    PT Assessment Patient needs continued PT services  PT Problem List Decreased strength;Decreased mobility;Decreased balance;Cardiopulmonary status limiting activity;Decreased activity tolerance;Decreased safety awareness       PT Treatment Interventions DME instruction;Gait training;Therapeutic activities;Functional mobility training;Therapeutic exercise;Stair training;Neuromuscular re-education;Patient/family education    PT Goals (Current goals can be found in the Care Plan section)  Acute Rehab PT Goals Patient Stated Goal: return home soon PT Goal Formulation: With patient Time For Goal Achievement: 10/20/2018 Potential to Achieve Goals: Fair    Frequency Min 3X/week   Barriers to discharge        Co-evaluation               AM-PAC PT "6 Clicks" Mobility  Outcome Measure Help needed turning from your back to your side while in a flat bed without using bedrails?: A Little Help needed moving from lying on your back to sitting on the side of a flat bed without using bedrails?: A Little Help needed moving to and from a bed to a chair (including a wheelchair)?: A Little Help needed standing up from a chair using your arms (e.g., wheelchair or bedside  chair)?: A Little Help needed to walk in hospital room?: A Lot Help needed climbing 3-5 steps with a railing? : Total 6 Click Score: 15    End of Session Equipment Utilized During Treatment: Gait belt;Oxygen Activity Tolerance: Patient limited by fatigue Patient left: in chair;with family/visitor present;with call bell/phone within reach Nurse Communication: Mobility status PT Visit Diagnosis: Difficulty in walking, not elsewhere classified (R26.2);Muscle weakness (generalized) (M62.81);Unsteadiness on feet (R26.81)    Time: 8127-5170 PT Time Calculation (min) (ACUTE ONLY): 17 min   Charges:   PT Evaluation $PT Eval Moderate Complexity: 1 Mod          Lanney Gins, PT, DPT Supplemental Physical Therapist 09/24/18 9:56 AM Pager: [phone] Office: [phone]

## 2018-09-24 NOTE — Consult Note (Signed)
[place] Psychiatry Consult   Reason for Consult: Paranoia Referring Physician:  Dr. Bonner Puna Patient Identification: Blake Hines MRN:  [id number] Principal Diagnosis: Delirium Diagnosis:  Principal Problem:   Aspiration pneumonia [facility]) Active Problems:   Metastatic renal cell carcinoma to lung, left ([place])   AKI (acute kidney injury) ([place])   Type 2 diabetes mellitus without complication ([place])   Bipolar disorder ([place])   Acute respiratory failure with hypoxia ([place])   Hypercalcemia   Total Time spent with patient: 1 hour  Subjective:   Blake Hines is a 85 y.o. male patient admitted with aspiration pneumonia with acute on chronic hypoxic respiratory failure.  HPI:   Per chart review, patient was admitted with aspiration pneumonia with acute on chronic hypoxic respiratory failure. He is on 2 L of oxygen at home. He is receiving treatment for acute renal failure on chronic kidney disease, hyponatremia secondary to SIADH, acute pulmonary embolism and hyercalemia of malignancy. He was diagnosed with metastatic renal cell carcinoma in 07/2018. He is receiving immunotherapy. He has completed 2 cycles of therapy. He has a history of bipolar disorder. Home medications include Zyprexa 2.5 mg qhs. Zoloft was discontinued in the hospital due to hyponatremia. His daughter reports that he has had some paranoia since his prior hospitalization (discharged on 2/15 for multifocal pneumonia). Zyprexa was increased to 5 mg qhs on Friday by his PCP. He is recommended for SNF placement versus home with home health and palliative care.   On interview, Blake Hines reports that he feels okay. He denies feeling depressed or sad. He denies SI, HI or AVH. He denies a history of suicide attempts. He reports multiple nighttime awakenings secondary to nightmares. He reports fair appetite. He has been manic in the past according to his daughter at bedside. He provides verbal consent for her to be present during  interview. She reports that he is back to his baseline. His outpatient provider did increase his Zyprexa dose recently and this may have also contributed to improvement in his symptoms. She believes that a recent UTI contributed to AMS. He would become confused in the evening and endorsed suicidal statements and delusional thoughts. She does not have concerns for his safety. He has guns at home but he does not have access to them. She reports that he has had a rapid decline in his health. He was independent of his ADLs just a week ago. Patient and his daughter feel like it is best for him to discharge to a ALF.    Past Psychiatric History: BPAD and depression.   Risk to Self:  None. Denies SI.  Risk to Others:  None. Denies HI.  Prior Inpatient Therapy:  He was last hospitalized in 2000 in [place] for psychosis.  Prior Outpatient Therapy:  He is followed by Dr. Etta Quill in [place].   Past Medical History:  Past Medical History:  Diagnosis Date  . Bipolar 1 disorder ([place])   . BPH (benign prostatic hyperplasia)   . Cancer ([place])    RCC  . Cataract   . Chronic kidney disease   . COPD (chronic obstructive pulmonary disease) ([place])   . Depression   . Diabetes mellitus without complication ([place])   . Hypertension    History reviewed. No pertinent surgical history. Family History: History reviewed. No pertinent family history. Family Psychiatric  History: Father and son-BPAD.  Social History:  Social History   Substance and Sexual Activity  Alcohol Use Yes   Comment: 50  cc wild [place] daily     Social History   Substance and Sexual Activity  Drug Use Not on file    Social History   Socioeconomic History  . Marital status: Widowed    Spouse name: Not on file  . Number of children: Not on file  . Years of education: Not on file  . Highest education level: Not on file  Occupational History  . Not on file  Social Needs  . Financial resource strain: Not on file  . Food  insecurity:    Worry: Not on file    Inability: Not on file  . Transportation needs:    Medical: Not on file    Non-medical: Not on file  Tobacco Use  . Smoking status: Former Smoker    Packs/day: 0.50    Years: 40.00    Pack years: 20.00    Types: Cigarettes    Last attempt to quit: 07/13/2003    Years since quitting: 15.2  . Smokeless tobacco: Never Used  Substance and Sexual Activity  . Alcohol use: Yes    Comment: 50 cc wild [place] daily  . Drug use: Not on file  . Sexual activity: Not on file  Lifestyle  . Physical activity:    Days per week: Not on file    Minutes per session: Not on file  . Stress: Not on file  Relationships  . Social connections:    Talks on phone: Not on file    Gets together: Not on file    Attends religious service: Not on file    Active member of club or organization: Not on file    Attends meetings of clubs or organizations: Not on file    Relationship status: Not on file  Other Topics Concern  . Not on file  Social History Narrative  . Not on file   Additional Social History: He lives at home alone. He is a retired English as a second language teacher. He drinks 50 cc of Wild [place] nightly. He denies illicit substance use.     Allergies:  No Known Allergies  Labs:  Results for orders placed or performed during the hospital encounter of 09/23/18 (from the past 48 hour(s))  CBC with Differential/Platelet     Status: Abnormal   Collection Time: 09/23/18  9:24 AM  Result Value Ref Range   WBC 10.2 4.0 - 10.5 K/uL   RBC 4.23 4.22 - 5.81 MIL/uL   Hemoglobin 11.9 (L) 13.0 - 17.0 g/dL   HCT 37.4 (L) 39.0 - 52.0 %   MCV 88.4 80.0 - 100.0 fL   MCH 28.1 26.0 - 34.0 pg   MCHC 31.8 30.0 - 36.0 g/dL   RDW 13.0 11.5 - 15.5 %   Platelets 333 150 - 400 K/uL   nRBC 0.0 0.0 - 0.2 %   Neutrophils Relative % 71 %   Neutro Abs 7.2 1.7 - 7.7 K/uL   Lymphocytes Relative 12 %   Lymphs Abs 1.3 0.7 - 4.0 K/uL   Monocytes Relative 8 %   Monocytes Absolute 0.8 0.1 - 1.0 K/uL    Eosinophils Relative 6 %   Eosinophils Absolute 0.7 (H) 0.0 - 0.5 K/uL   Basophils Relative 1 %   Basophils Absolute 0.1 0.0 - 0.1 K/uL   Immature Granulocytes 2 %   Abs Immature Granulocytes 0.21 (H) 0.00 - 0.07 K/uL    Comment: Performed at [facility], [place] [address] [place], [place] [address]  Brain natriuretic peptide  Status: None   Collection Time: 09/23/18  9:24 AM  Result Value Ref Range   B Natriuretic Peptide 47.8 0.0 - 100.0 pg/mL    Comment: Performed at [facility], [place] [address] [place], [place] [address]  Comprehensive metabolic panel     Status: Abnormal   Collection Time: 09/23/18  9:24 AM  Result Value Ref Range   Sodium 132 (L) 135 - 145 mmol/L   Potassium 4.1 3.5 - 5.1 mmol/L   Chloride 95 (L) 98 - 111 mmol/L   CO2 31 22 - 32 mmol/L   Glucose, Bld 138 (H) 70 - 99 mg/dL   BUN 39 (H) 8 - 23 mg/dL   Creatinine, Ser 2.59 (H) 0.61 - 1.24 mg/dL   Calcium 11.6 (H) 8.9 - 10.3 mg/dL   Total Protein 6.0 (L) 6.5 - 8.1 g/dL   Albumin 2.5 (L) 3.5 - 5.0 g/dL   AST 39 15 - 41 U/L   ALT 80 (H) 0 - 44 U/L   Alkaline Phosphatase 56 38 - 126 U/L   Total Bilirubin 0.3 0.3 - 1.2 mg/dL   GFR calc non Af Amer 22 (L) >60 mL/min   GFR calc Af Amer 25 (L) >60 mL/min   Anion gap 6 5 - 15    Comment: Performed at [facility], [place] [address] [place], [place] [address]  Troponin I - ONCE - STAT     Status: None   Collection Time: 09/23/18  9:24 AM  Result Value Ref Range   Troponin I <0.03 <0.03 ng/mL    Comment: Performed at [facility], [place] [address] [place], [place] [address]  Influenza panel by PCR (type A & B)     Status: None   Collection Time: 09/23/18  9:24 AM  Result Value Ref Range   Influenza A By PCR NEGATIVE NEGATIVE   Influenza B By PCR NEGATIVE NEGATIVE    Comment: (NOTE) The Xpert Xpress Flu assay is intended as an aid in the diagnosis of  influenza and should not be used as a  sole basis for treatment.  This  assay is FDA approved for nasopharyngeal swab specimens only. Nasal  washings and aspirates are unacceptable for Xpert Xpress Flu testing. Performed at [facility], [place] [address], [place],  [address]   Magnesium     Status: None   Collection Time: 09/23/18  9:24 AM  Result Value Ref Range   Magnesium 1.7 1.7 - 2.4 mg/dL    Comment: Performed at [facility], [place] [address], [place],  [address]  Urinalysis, Routine w reflex microscopic     Status: Abnormal   Collection Time: 09/23/18  9:25 AM  Result Value Ref Range   Color, Urine YELLOW YELLOW   APPearance CLEAR CLEAR   Specific Gravity, Urine 1.006 1.005 - 1.030   pH 7.0 5.0 - 8.0   Glucose, UA NEGATIVE NEGATIVE mg/dL   Hgb urine dipstick NEGATIVE NEGATIVE   Bilirubin Urine NEGATIVE NEGATIVE   Ketones, ur NEGATIVE NEGATIVE mg/dL   Protein, ur NEGATIVE NEGATIVE mg/dL   Nitrite NEGATIVE NEGATIVE   Leukocytes,Ua TRACE (A) NEGATIVE   RBC / HPF 0-5 0 - 5 RBC/hpf   WBC, UA 0-5 0 - 5 WBC/hpf   Bacteria, UA RARE (A) NONE SEEN   Squamous Epithelial / LPF 0-5 0 - 5   Mucus PRESENT    Hyphae Yeast PRESENT    Uric Acid Crys, UA PRESENT  Comment: Performed at [facility], [place] [address] [place], [place] [address]  Glucose, capillary     Status: Abnormal   Collection Time: 09/23/18  4:41 PM  Result Value Ref Range   Glucose-Capillary 148 (H) 70 - 99 mg/dL  Glucose, capillary     Status: Abnormal   Collection Time: 09/23/18  9:07 PM  Result Value Ref Range   Glucose-Capillary 263 (H) 70 - 99 mg/dL  CBC     Status: Abnormal   Collection Time: 09/24/18  6:59 AM  Result Value Ref Range   WBC 9.9 4.0 - 10.5 K/uL   RBC 4.21 (L) 4.22 - 5.81 MIL/uL   Hemoglobin 11.5 (L) 13.0 - 17.0 g/dL   HCT 37.3 (L) 39.0 - 52.0 %   MCV 88.6 80.0 - 100.0 fL   MCH 27.3 26.0 - 34.0 pg   MCHC 30.8 30.0 - 36.0 g/dL   RDW 12.9 11.5 - 15.5 %    Platelets 334 150 - 400 K/uL   nRBC 0.0 0.0 - 0.2 %    Comment: Performed at [facility], [place] [address], [place], [place] [address]  Comprehensive metabolic panel     Status: Abnormal   Collection Time: 09/24/18  6:59 AM  Result Value Ref Range   Sodium 130 (L) 135 - 145 mmol/L   Potassium 4.4 3.5 - 5.1 mmol/L   Chloride 94 (L) 98 - 111 mmol/L   CO2 28 22 - 32 mmol/L   Glucose, Bld 139 (H) 70 - 99 mg/dL   BUN 38 (H) 8 - 23 mg/dL   Creatinine, Ser 2.52 (H) 0.61 - 1.24 mg/dL   Calcium 11.2 (H) 8.9 - 10.3 mg/dL   Total Protein 5.6 (L) 6.5 - 8.1 g/dL   Albumin 2.4 (L) 3.5 - 5.0 g/dL   AST 29 15 - 41 U/L   ALT 67 (H) 0 - 44 U/L   Alkaline Phosphatase 55 38 - 126 U/L   Total Bilirubin 0.7 0.3 - 1.2 mg/dL   GFR calc non Af Amer 22 (L) >60 mL/min   GFR calc Af Amer 26 (L) >60 mL/min   Anion gap 8 5 - 15    Comment: Performed at [facility], [place] [address], [place], [place] [address]  Hemoglobin A1c     Status: Abnormal   Collection Time: 09/24/18  6:59 AM  Result Value Ref Range   Hgb A1c MFr Bld 7.6 (H) 4.8 - 5.6 %    Comment: (NOTE) Pre diabetes:          5.7%-6.4% Diabetes:              >6.4% Glycemic control for   <7.0% adults with diabetes    Mean Plasma Glucose 171.42 mg/dL    Comment: Performed at [place] [address] [place], [place] [address]  Glucose, capillary     Status: Abnormal   Collection Time: 09/24/18  7:34 AM  Result Value Ref Range   Glucose-Capillary 133 (H) 70 - 99 mg/dL  Glucose, capillary     Status: Abnormal   Collection Time: 09/24/18 11:19 AM  Result Value Ref Range   Glucose-Capillary 247 (H) 70 - 99 mg/dL    Current Facility-Administered Medications  Medication Dose Route Frequency Provider Last Rate Last Dose  . 0.45 % sodium chloride infusion   Intravenous Continuous Bonnielee Haff, MD 75 mL/hr at 09/24/18 0640    . acetaminophen (TYLENOL) tablet 650 mg  650  mg Oral Q6H PRN Bonnielee Haff, MD   650 mg at 09/24/18 0645   Or  . acetaminophen (TYLENOL) suppository 650 mg  650 mg Rectal Q6H PRN Bonnielee Haff, MD      . amLODipine (NORVASC) tablet 5 mg  5 mg Oral Daily Bonnielee Haff, MD   5 mg at 09/24/18 2725  . Ampicillin-Sulbactam (UNASYN) 3 g in sodium chloride 0.9 % 100 mL IVPB  3 g Intravenous Q12H Shade, Christine E, RPH 200 mL/hr at 09/24/18 0929 3 g at 09/24/18 0929  . demeclocycline (DECLOMYCIN) tablet 150 mg  150 mg Oral BID Bonnielee Haff, MD   150 mg at 09/24/18 3664  . enoxaparin (LOVENOX) injection 70 mg  70 mg Subcutaneous Q24H Randa Spike, RPH   70 mg at 09/23/18 2126  . insulin aspart (novoLOG) injection 0-15 Units  0-15 Units Subcutaneous TID WC Bonnielee Haff, MD   5 Units at 09/24/18 1127  . latanoprost (XALATAN) 0.005 % ophthalmic solution 1 drop  1 drop Both Eyes QHS Bonnielee Haff, MD   1 drop at 09/23/18 2127  . metoprolol tartrate (LOPRESSOR) tablet 25 mg  25 mg Oral BID Bonnielee Haff, MD   25 mg at 09/24/18 4034  . multivitamin with minerals tablet 1 tablet  1 tablet Oral Daily Bonnielee Haff, MD   1 tablet at 09/24/18 [phone]  . OLANZapine (ZYPREXA) tablet 5 mg  5 mg Oral QHS Bonnielee Haff, MD   5 mg at 09/23/18 2126  . ondansetron (ZOFRAN) tablet 4 mg  4 mg Oral Q6H PRN Bonnielee Haff, MD       Or  . ondansetron [facility]) injection 4 mg  4 mg Intravenous Q6H PRN Bonnielee Haff, MD      . saccharomyces boulardii (FLORASTOR) capsule 250 mg  250 mg Oral BID Bonnielee Haff, MD   250 mg at 09/24/18 9563  . umeclidinium-vilanterol (ANORO ELLIPTA) 62.5-25 MCG/INH 1 puff  1 puff Inhalation Daily Bonnielee Haff, MD   1 puff at 09/24/18 0941    Musculoskeletal: Strength & Muscle Tone: Generalized weakness. Gait & Station: UTA since patient is sitting in a chair. Patient leans: N/A  Psychiatric Specialty Exam: Physical Exam  Nursing note and vitals reviewed. Constitutional: He is oriented to person, place, and time. He appears  well-developed and well-nourished.  HENT:  Head: Normocephalic and atraumatic.  Neck: Normal range of motion.  Respiratory: Effort normal.  Musculoskeletal: Normal range of motion.  Neurological: He is alert and oriented to person, place, and time.  Psychiatric: He has a normal mood and affect. Judgment and thought content normal. His speech is slurred. He is slowed. Cognition and memory are normal.    Review of Systems  Cardiovascular: Negative for chest pain.  Gastrointestinal: Negative for abdominal pain, constipation, diarrhea, nausea and vomiting.  Musculoskeletal: Positive for joint pain (right hip pain).  All other systems reviewed and are negative.   Blood pressure (!) 149/81, pulse 96, temperature (!) 97.5 F (36.4 C), temperature source Oral, resp. rate 19, height 5\' 8"  (1.727 m), weight 73.7 kg, SpO2 92 %.Body mass index is 24.71 kg/m.  General Appearance: Fairly Groomed, elderly, Caucasian male, wearing a hospital gown with neck forward and [place] in place who is lying in bed. NAD.   Eye Contact:  Good  Speech:  Slow and Slurred  Volume:  Decreased  Mood:  Euthymic  Affect:  Constricted  Thought Process:  Goal Directed, Linear and Descriptions of Associations: Intact  Orientation:  Full (Time,  Place, and Person)  Thought Content:  Logical  Suicidal Thoughts:  No  Homicidal Thoughts:  No  Memory:  Immediate;   Fair Recent;   Fair Remote;   Fair  Judgement:  Fair  Insight:  Fair  Psychomotor Activity:  Normal  Concentration:  Concentration: Good and Attention Span: Good  Recall:  Good  Fund of Knowledge:  Good  Language:  Good  Akathisia:  No  Handed:  Right  AIMS (if indicated):   N/A  Assets:  Agricultural consultant Housing Resilience Social Support  ADL's:  Impaired  Cognition:  WNL  Sleep:   Fair   Assessment:  Blake Hines is a 85 y.o. male who was admitted with aspiration pneumonia with acute on chronic hypoxic respiratory  failure. He is on 2 L of oxygen at home. He is receiving treatment for acute renal failure on chronic kidney disease, hyponatremia secondary to SIADH, acute pulmonary embolism and hyercalemia of malignancy. He is oriented to person, place and time. He denies SI, HI or AVH. He denies mood symptoms. He does not warrant further medication adjustments and it appears that his recent mental status change was secondary to an infectious etiology. He will continue follow up with his outpatient mental health provider.   Treatment Plan Summary: -Resume Zoloft when patient is medically stable and/or sodium improves.  -Continue Zyprexa 5 mg qhs for psychosis.  -EKG reviewed and QTc 465 on 2/23. Please closely monitor when starting or increasing QTc prolonging agents.  -Psychiatry will sign off on patient at this time. Please consult psychiatry again as needed.   Disposition: No evidence of imminent risk to self or others at present.   Patient does not meet criteria for psychiatric inpatient admission.  Faythe Dingwall, DO 09/24/2018 12:53 PM

## 2018-09-24 NOTE — Evaluation (Addendum)
Clinical/Bedside Swallow Evaluation Patient Details  Name: Blake Hines MRN: [id number] Date of Birth: 24-May-1931  Today's Date: 09/24/2018 Time: SLP Start Time (ACUTE ONLY): 1117 SLP Stop Time (ACUTE ONLY): 1205 SLP Time Calculation (min) (ACUTE ONLY): 48 min  Past Medical History:  Past Medical History:  Diagnosis Date  . Bipolar 1 disorder ([place])   . BPH (benign prostatic hyperplasia)   . Cancer ([place])    RCC  . Cataract   . Chronic kidney disease   . COPD (chronic obstructive pulmonary disease) ([place])   . Depression   . Diabetes mellitus without complication ([place])   . Hypertension    Past Surgical History: History reviewed. No pertinent surgical history. HPI:  85 yo male adm to [facility]  09/23/18 with primary complaints of worsening cough. Chest x-ray showed worsening left lower lobe infiltrate. Recent hospitalization and discharge on 09/15/18. PMH significant for metastatic renal cell carcinoma status post nephrectomy, on immunotherapy with last cycle approximately 4 weeks ago, history of bipolar disorder, chronic kidney disease stage III, history of COPD, chronic respiratory failure on home oxygen, diabetes mellitus type 2, essential hypertension. Admitted for Aspiration pneumonia with acute on chronic hypoxic respiratory failure.  Swallow evaluation ordered.  Pt denies dysphagia, admits to some difficulty with swallowing foods causing him to cough.     Assessment / Plan / Recommendation Clinical Impression  No severe focal CN deficits apparent, however pt with mild decreased labial closure strength on left - ? facial nerve deficit.  In addition, pt did not pass 3 ounce water test as he overtly coughed immediately post=swallow resulting in vomiting x2 with emesis of thin secretions.  Likley due to ill timed swallow and decreased sustained laryngeal closure with sequential boluses.  Inhalation during sequential swallowing also noted which could allow aspiration.  Small SINGLE boluses tolerated  better than large sequential boluses but are challenging for pt to conduct.  Thin liquids clinically are lesser tolerated than nectar.  Chin tuck posture tested to help compensate for potential decreased timing of airway closure but was not effective to prevent cough clinically with sequential boluses.  SLP offered bolus flow control Provale cup to which pt politely declined.  As pt tolerated nectar thickened drinks and soft solids adequately- recommend consider dys3/nectar with meals and allow thin drinks between meals. Nectar liquid consumption will likely improve pt's comfort with intake due to his overt aspiration, excessive coughing with thins.  Pt states he mostly consumes water only- therefore would recommend to continue thins between meals.   He is agreeable to plan and desires at this time to forgo instrumental swallow evaluation.  Will follow up for dysphagia management/education.  SLP Visit Diagnosis: Dysphagia, unspecified (R13.10)    Aspiration Risk  Moderate aspiration risk    Diet Recommendation Dysphagia 3 (Mech soft);Nectar-thick liquid(thin between meals)   Liquid Administration via: Cup;Straw Medication Administration: Whole meds with puree(start and follow with thin water - pt with xerostomia) Supervision: Staff to assist with self feeding Compensations: Slow rate;Small sips/bites Postural Changes: Remain upright for at least 30 minutes after po intake;Seated upright at 90 degrees    Other  Recommendations Oral Care Recommendations: Oral care BID Other Recommendations: Order thickener from pharmacy   Follow up Recommendations        Frequency and Duration min 1 x/week  1 week       Prognosis Prognosis for Safe Diet Advancement: Fair Barriers to Reach Goals: Other (Comment)(medical diagnosis)      Swallow Study   General  Date of Onset: 09/24/18 HPI: 85 yo male adm to [facility]  09/23/18 with primary complaints of worsening cough. Chest x-ray showed worsening left lower  lobe infiltrate. Recent hospitalization and discharge on 09/15/18. PMH significant for metastatic renal cell carcinoma status post nephrectomy, on immunotherapy with last cycle approximately 4 weeks ago, history of bipolar disorder, chronic kidney disease stage III, history of COPD, chronic respiratory failure on home oxygen, diabetes mellitus type 2, essential hypertension. Admitted for Aspiration pneumonia with acute on chronic hypoxic respiratory failure.  Swallow evaluation ordered.  Pt denies dysphagia, admits to some difficulty with swallowing foods causing him to cough.   Type of Study: Bedside Swallow Evaluation Diet Prior to this Study: Dysphagia 3 (soft);Thin liquids Temperature Spikes Noted: No Respiratory Status: Nasal cannula(3) History of Recent Intubation: No Behavior/Cognition: Alert;Cooperative;Pleasant mood Oral Cavity Assessment: Erythema(posterior oral cavity- soft palate region) Oral Care Completed by SLP: No Oral Cavity - Dentition: Adequate natural dentition Vision: Functional for self-feeding Self-Feeding Abilities: Able to feed self Patient Positioning: Upright in bed Baseline Vocal Quality: Low vocal intensity Volitional Cough: Weak Volitional Swallow: Unable to elicit    Oral/Motor/Sensory Function Overall Oral Motor/Sensory Function: Generalized oral weakness Facial ROM: (? mild decreased phonatory strength on left - air loss and liquid leakage)   Ice Chips Ice chips: Not tested Other Comments: pt declined to consume ice chips   Thin Liquid Thin Liquid: Impaired Pharyngeal  Phase Impairments: Cough - Immediate;Cough - Delayed Other Comments: 3 ounce water test was not passed as pt with overt coughing post-swallow and near vomiting, expectoration of secretions, suspect ill timed swallow,decr oral coordination/transit and inhalation during swallowing, small single boluses tolerated better than large sequential boluses, chin tuck posture tested and not effective  clinically, offered Provale cup to pt to which he politely declinec    Nectar Thick Nectar Thick Liquid: Impaired Presentation: Cup;Straw Pharyngeal Phase Impairments: Cough - Delayed Other Comments: sequential swallows with delayed cough   Honey Thick Honey Thick Liquid: Not tested   Puree Puree: Not tested   Solid     Solid: Within functional limits Presentation: Self Fed;Spoon Other Comments: slow but appearance of effective mastication  no oral residuals noted- soft peaches      Macario Golds 09/24/2018,1:33 PM   Luanna Salk, [place] Pager [phone] Office [phone]

## 2018-09-25 ENCOUNTER — Inpatient Hospital Stay (HOSPITAL_COMMUNITY): Payer: Medicare Other

## 2018-09-25 DIAGNOSIS — R410 Disorientation, unspecified: Secondary | ICD-10-CM

## 2018-09-25 LAB — URINALYSIS, ROUTINE W REFLEX MICROSCOPIC
Bilirubin Urine: NEGATIVE
Glucose, UA: NEGATIVE mg/dL
Hgb urine dipstick: NEGATIVE
Ketones, ur: NEGATIVE mg/dL
LEUKOCYTE UA: NEGATIVE
Nitrite: NEGATIVE
Protein, ur: NEGATIVE mg/dL
Specific Gravity, Urine: 1.008 (ref 1.005–1.030)
pH: 7 (ref 5.0–8.0)

## 2018-09-25 LAB — GLUCOSE, CAPILLARY
Glucose-Capillary: 185 mg/dL — ABNORMAL HIGH (ref 70–99)
Glucose-Capillary: 192 mg/dL — ABNORMAL HIGH (ref 70–99)
Glucose-Capillary: 171 mg/dL — ABNORMAL HIGH (ref 70–99)
Glucose-Capillary: 197 mg/dL — ABNORMAL HIGH (ref 70–99)

## 2018-09-25 LAB — BASIC METABOLIC PANEL
Anion gap: 7 (ref 5–15)
BUN: 37 mg/dL — ABNORMAL HIGH (ref 8–23)
CO2: 28 mmol/L (ref 22–32)
Calcium: 10.7 mg/dL — ABNORMAL HIGH (ref 8.9–10.3)
Chloride: 96 mmol/L — ABNORMAL LOW (ref 98–111)
Creatinine, Ser: 2.77 mg/dL — ABNORMAL HIGH (ref 0.61–1.24)
GFR calc Af Amer: 23 mL/min — ABNORMAL LOW (ref >60)
GFR calc non Af Amer: 20 mL/min — ABNORMAL LOW (ref >60)
Glucose, Bld: 125 mg/dL — ABNORMAL HIGH (ref 70–99)
Potassium: 4.4 mmol/L (ref 3.5–5.1)
Sodium: 131 mmol/L — ABNORMAL LOW (ref 135–145)

## 2018-09-25 LAB — SODIUM, URINE, RANDOM: Sodium, Ur: 56 mmol/L

## 2018-09-25 MED ORDER — TUBERCULIN PPD 5 UNIT/0.1ML ID SOLN
5.0000 [IU] | Freq: Once | INTRADERMAL | Status: AC
  Administered 2018-09-25: 5 [IU] via INTRADERMAL
  Filled 2018-09-25: qty 0.1

## 2018-09-25 NOTE — Progress Notes (Signed)
Patient dcing to ALF.   Facility asking for TB test. LCSW notified attending via text/page.   Carolin Coy [place]

## 2018-09-25 NOTE — Clinical Social Work Note (Signed)
Clinical Social Work Assessment  Patient Details  Name: Blake Hines MRN: [id number] Date of Birth: 03/21/1931  Date of referral:  09/25/18               Reason for consult:  Facility Placement, End of Life/Hospice                Permission sought to share information with:  Facility Contact Representative, Family Supports Permission granted to share information::  Yes, Verbal Permission Granted  Name::     [name]  Agency::  [facility] ( [place])   Relationship::  daughter  Contact Information:     Housing/Transportation Living arrangements for the past 2 months:  Single Family Home Source of Information:  Adult Children Patient Interpreter Needed:  None Criminal Activity/Legal Involvement Pertinent to Current Situation/Hospitalization:  No - Comment as needed Significant Relationships:  Adult Children, Community Support, Other Family Members Lives with:  Self Do you feel safe going back to the place where you live?  No Need for family participation in patient care:  Yes (Comment)  Care giving concerns:  No care giving concerns at the time of assessment.    Social Worker assessment / plan:  LCSW consulted for dc planning.   Patient admitted for worsening cough over the last 2 days.  LCSW met at bedside with patient and patients daughter, [name].  Patient was sleeping in bed and briefly woke up to speak to LCSW. Pt thanked LCSW for care coordination.   Patient is from home alone. Patient is going to  [facility] in [place] with hospice.  Patient and family would like for patient to dc home from the hospital and they will arrange transportation to [facility]. Family is aware of cost and willing to pay out of pocket. Ideally they would like to transport by car if able. Patient has at home o2 that dtr will bring for transport if pt goes by car. If unable to go by car PTAR home is preferred.   LCSW called and left message for nurse at [facility] to  determine dc needs and hospice arrangements.   PLAN: Patient to dc home from hospital. LCSW to fax dc docs to [facility]  Employment status:  Retired Insurance information:  Medicare PT Recommendations:    Information / Referral to community resources:     Patient/Family's Response to care:  Family is proactive in patient care. Family is thankful for LCSW visit and dc coordination.  Patient/Family's Understanding of and Emotional Response to Diagnosis, Current Treatment, and Prognosis: Patient and family are realistic about patients level of care at this time. Patient and family have made arrangements for higher level of care.   Emotional Assessment Appearance:  Appears stated age Attitude/Demeanor/Rapport:  Unable to Assess Affect (typically observed):  Calm Orientation:  Oriented to Self, Oriented to Place Alcohol / Substance use:  Not Applicable Psych involvement (Current and /or in the community):  No (Comment)  Discharge Needs  Concerns to be addressed:  No discharge needs identified Readmission within the last 30 days:  Yes Current discharge risk:  None Barriers to Discharge:  No Barriers Identified    , LCSW 09/25/2018, 10:05 AM  

## 2018-09-25 NOTE — Progress Notes (Signed)
Patient ID: Blake Hines, male   DOB: March 24, 1931, 85 y.o.   MRN: [id number]  This NP visited patient at the bedside as a follow up to  yesterday's [place].  Daughter is at bedside.  Patient is lethargic and appears weaker today.  Continued conversation regarding diagnosis, prognosis, goals of care, end-of-life wishes disposition and options.  Patient and family were grateful for Dr. Lew Dawes input yesterday from an oncology standpoint in that it  helped them make decisions and shift to a more comfort approach. Patient and family are now at peace with the decision to focus on comfort, quality and dignity and forego life prolonging measures once discharged from the hospital.  Family is hoping to transition patient to [facility] facility in [place] with hospice.  I will discuss with social work to help with this transition of care.  Again encouraged completion of MOST form.   Discussed with patient the importance of continued conversation with each other and their  medical providers regarding overall plan of care and treatment options  ensuring decisions are within the context of the patients values and GOCs.  Questions and concerns addressed   Discussed with Dr Bonner Puna, anticiapted discharge in the next day or 2.  Total time spent on the unit was 35 minutes  Greater than 50% of the time was spent in counseling and coordination of care  Wadie Lessen NP  Palliative Medicine Team Team Phone # [phone] Pager [phone]

## 2018-09-25 NOTE — Progress Notes (Signed)
PROGRESS NOTE  Blake Hines  XTK:[id number] DOB: [birth year] DOA: 09/23/2018 PCP: Crist Infante, MD  Outpatient Specialists: Oncology, Dr. Julien Nordmann Brief Narrative: Blake Hines is an 85 y.o. male with a history of metastatic RCC s/p nephrectomy on immunotherapy, stage III CKD, COPD on home oxygen, PE recently started on lovenox, hyponatremia on democlocycline, hypercalcemia, T2DM, HTN, and bipolar disorder who presented due to increased cough productive of yellow sputum and weakness, also with his daughter having noticed coughing with liquid administration. He had been doing well following discharge on 2/15 (admitted for dehydration, found to be hyponatremic, hypercalcemic, and with PE), but symptoms progressed quickly over 2 days prompting ED visit. His daughter also reports paranoia developing at home. His psychiatrist was to make a home visit on the day of arrival, but his symptoms prompted ED visit on 2/23 where creatinine was elevated, and CXR showed worsening left lower lobe infiltrate concerning for aspiration pneumonia. Zosyn and speech therapy consultation were ordered, and he was admitted with IV fluids. Psychiatry and palliative care also consulted and the patient's oncologist, Dr. Julien Nordmann, was contacted for assessment.  Assessment & Plan: Principal Problem:   Delirium Active Problems:   Metastatic renal cell carcinoma to lung, left (HCC)   Weakness generalized   AKI (acute kidney injury) ([place])   Type 2 diabetes mellitus without complication (HCC)   Bipolar disorder ([place])   Acute respiratory failure with hypoxia ([place])   Aspiration pneumonia ([place])   Hypercalcemia   Palliative care by specialist   DNR (do not resuscitate)  Acute on chronic hypoxic respiratory failure due to aspiration pneumonia in patient with COPD: BNP, troponin, flu all normal/negative. - Continue 3L O2 if needed to maintain SpO2 >90% and/or for comfort.  - SLP evaluation: Nectar thickened liquids, but can take  normal liquids between meals. - Continue unasyn empirically.  - Sputum culture sent - Aspiration precautions - Continue bronchodilators, anoro ellipta  Acute renal failure on stage III CKD in pt s/p nephrectomy: Worsening. - Continue IVF, avoid contrast and hypotension. Check FENa and U/S. Very high risk of renal failure with hx nephrectomy. - Monitor BMP  Metastatic renal cell CA s/p nephrectomy on immunotherapy: Patient and family discussed with Dr. Julien Nordmann and palliative care while hospitalized and came to the plan of discontinuing treatment, discharging to ALF with hospice care. This will be in [place], [place] working very diligently on this. PPD odered per their request.  Bipolar disorder: with recent suicidality and paranoid features per his daughter at bedside. Suspect symptoms worsened with discontinuation of zoloft.  - Psychiatry consulted for assistance with management. Continue current management: Zyprexa 5mg  qHS  T2DM: HbA1c 7.6% - Continue mod SSI and CBG AC/HS. At inpatient goal.  Hyponatremia, SIADH: Na improved, DC'ed ARB, HCTZ, SSRI at recent admission - Continue democlocycline   PE:  - Continue lovenox, which was chosen to decrease pill burden  HTN:  - Continue metoprolol and norvasc (substituted for ARB/HCTZ at recent admission)  Hypercalcemia of malignancy: Improved from previous. s/p Aredia 2/12. - Monitor  Anemia of chronic disease:  - Monitor hgb  DVT prophylaxis: Treatment dose lovenox Code Status: DNR Family Communication: Daughter at bedside Disposition Plan: Plan to DC to ALF with hospice care. May be stable in next 24-48 hours.   Consultants:   Psychiatry  Palliative care medicine  Oncology  Procedures:   None  Antimicrobials:  Unasyn   Subjective: Feeling better today, slept some. Breathing feels better to him and appears better to daughter.  No paranoia reported. Still with cough, no fever.  Objective: Vitals:   09/25/18 0622  09/25/18 0847 09/25/18 1000 09/25/18 1444  BP: 129/78  (!) 142/75 (!) 150/72  Pulse: 87  (!) 102   Resp: 20  20   Temp: 98.2 F (36.8 C)  98.2 F (36.8 C) 99 F (37.2 C)  TempSrc: Oral  Oral Oral  SpO2: 96% 94% 94%   Weight:      Height:        Intake/Output Summary (Last 24 hours) at 09/25/2018 1545 Last data filed at 09/25/2018 1500 Gross per 24 hour  Intake 37.5 ml  Output 2800 ml  Net -2762.5 ml   Filed Weights   09/23/18 1606  Weight: 73.7 kg   Gen: [place], elderly male in no distress Pulm: Nonlabored breathing room air. Left lower zone crackles. CV: Regular rate and rhythm. No murmur, rub, or gallop. No JVD, no significant dependent edema. GI: Abdomen soft, non-tender, non-distended, with normoactive bowel sounds.  Ext: Warm, no deformities Skin: No new rashes, lesions or ulcers on visualized skin. Neuro: Alert, conversant, no focal neurological deficits. Psych: Judgement and insight appear limited. Mood euthymic & affect congruent. Behavior is appropriate.    Data Reviewed: I have personally reviewed following labs and imaging studies  CBC: Recent Labs  Lab 09/23/18 0924 09/24/18 0659  WBC 10.2 9.9  NEUTROABS 7.2  --   HGB 11.9* 11.5*  HCT 37.4* 37.3*  MCV 88.4 88.6  PLT 333 160   Basic Metabolic Panel: Recent Labs  Lab 09/23/18 0924 09/24/18 0659 09/25/18 0613  NA 132* 130* 131*  K 4.1 4.4 4.4  CL 95* 94* 96*  CO2 31 28 28   GLUCOSE 138* 139* 125*  BUN 39* 38* 37*  CREATININE 2.59* 2.52* 2.77*  CALCIUM 11.6* 11.2* 10.7*  MG 1.7  --   --    GFR: Estimated Creatinine Clearance: 18.9 mL/min (A) (by C-G formula based on SCr of 2.77 mg/dL (H)). Liver Function Tests: Recent Labs  Lab 09/23/18 0924 09/24/18 0659  AST 39 29  ALT 80* 67*  ALKPHOS 56 55  BILITOT 0.3 0.7  PROT 6.0* 5.6*  ALBUMIN 2.5* 2.4*   No results for input(s): LIPASE, AMYLASE in the last 168 hours. No results for input(s): AMMONIA in the last 168 hours. Coagulation  Profile: No results for input(s): INR, PROTIME in the last 168 hours. Cardiac Enzymes: Recent Labs  Lab 09/23/18 0924  TROPONINI <0.03   BNP (last 3 results) No results for input(s): PROBNP in the last 8760 hours. HbA1C: Recent Labs    09/24/18 0659  HGBA1C 7.6*   CBG: Recent Labs  Lab 09/24/18 1119 09/24/18 1630 09/24/18 2046 09/25/18 0736 09/25/18 1125  GLUCAP 247* 142* 163* 185* 192*   Lipid Profile: No results for input(s): CHOL, HDL, LDLCALC, TRIG, CHOLHDL, LDLDIRECT in the last 72 hours. Thyroid Function Tests: No results for input(s): TSH, T4TOTAL, FREET4, T3FREE, THYROIDAB in the last 72 hours. Anemia Panel: No results for input(s): VITAMINB12, FOLATE, FERRITIN, TIBC, IRON, RETICCTPCT in the last 72 hours. Urine analysis:    Component Value Date/Time   COLORURINE YELLOW 09/23/2018 0925   APPEARANCEUR CLEAR 09/23/2018 0925   LABSPEC 1.006 09/23/2018 0925   PHURINE 7.0 09/23/2018 0925   GLUCOSEU NEGATIVE 09/23/2018 0925   HGBUR NEGATIVE 09/23/2018 0925   BILIRUBINUR NEGATIVE 09/23/2018 0925   KETONESUR NEGATIVE 09/23/2018 0925   PROTEINUR NEGATIVE 09/23/2018 0925   NITRITE NEGATIVE 09/23/2018 0925   LEUKOCYTESUR TRACE (A) 09/23/2018  0925   No results found for this or any previous visit (from the past 240 hour(s)).    Radiology Studies: No results found.  Scheduled Meds: . amLODipine  5 mg Oral Daily  . demeclocycline  150 mg Oral BID  . enoxaparin (LOVENOX) injection  70 mg Subcutaneous Q24H  . insulin aspart  0-15 Units Subcutaneous TID WC  . latanoprost  1 drop Both Eyes QHS  . metoprolol tartrate  25 mg Oral BID  . multivitamin with minerals  1 tablet Oral Daily  . OLANZapine  5 mg Oral QHS  . saccharomyces boulardii  250 mg Oral BID  . tuberculin  5 Units Intradermal Once  . umeclidinium-vilanterol  1 puff Inhalation Daily   Continuous Infusions: . sodium chloride 75 mL/hr at 09/24/18 1730  . ampicillin-sulbactam (UNASYN) IV 3 g (09/25/18  0825)     LOS: 2 days   Time spent: 35 minutes.  Patrecia Pour, MD [facility] [email or web address] Password [facility] 09/25/2018, 3:45 PM

## 2018-09-26 LAB — BASIC METABOLIC PANEL
ANION GAP: 7 (ref 5–15)
BUN: 35 mg/dL — ABNORMAL HIGH (ref 8–23)
CO2: 25 mmol/L (ref 22–32)
Calcium: 10.3 mg/dL (ref 8.9–10.3)
Chloride: 100 mmol/L (ref 98–111)
Creatinine, Ser: 2.35 mg/dL — ABNORMAL HIGH (ref 0.61–1.24)
GFR calc Af Amer: 28 mL/min — ABNORMAL LOW (ref >60)
GFR calc non Af Amer: 24 mL/min — ABNORMAL LOW (ref >60)
Glucose, Bld: 138 mg/dL — ABNORMAL HIGH (ref 70–99)
Potassium: 4.1 mmol/L (ref 3.5–5.1)
Sodium: 132 mmol/L — ABNORMAL LOW (ref 135–145)

## 2018-09-26 LAB — GLUCOSE, CAPILLARY
GLUCOSE-CAPILLARY: 108 mg/dL — AB (ref 70–99)
Glucose-Capillary: 126 mg/dL — ABNORMAL HIGH (ref 70–99)
Glucose-Capillary: 275 mg/dL — ABNORMAL HIGH (ref 70–99)
Glucose-Capillary: 139 mg/dL — ABNORMAL HIGH (ref 70–99)

## 2018-09-26 MED ORDER — AMOXICILLIN-POT CLAVULANATE 500-125 MG PO TABS
1.0000 | ORAL_TABLET | Freq: Two times a day (BID) | ORAL | Status: DC
  Administered 2018-09-26 – 2018-09-27 (×3): 500 mg via ORAL
  Filled 2018-09-26 (×4): qty 1

## 2018-09-26 NOTE — Progress Notes (Signed)
PT Cancellation Note  Patient Details Name: Blake Hines MRN: [id number] DOB: 1930-08-15   Cancelled Treatment:    Reason Eval/Treat Not Completed: Fatigue/lethargy limiting ability to participate(pt sleeping soundly, fatigued from being up with nursing earlier. Will hold. )   Philomena Doheny PT 09/26/2018  Acute Rehabilitation Services Pager [phone] Office [phone]

## 2018-09-26 NOTE — Progress Notes (Signed)
OT Cancellation Note  Patient Details Name: Blake Hines MRN: [id number] DOB: [birth year]   Cancelled Treatment:    Reason Eval/Treat Not Completed: Other (comment). Checked with pt/daughter to see if pt wants to continue to work with OT.  They are in agreement, however, pt does not want to move right now and WOB is increased at rest. They also have an appointment at 10:30. Will check back later, as schedule permits.  [place] 09/26/2018, 10:15 AM  Lesle Chris, OTR/L Acute Rehabilitation Services [phone] [facility] pager [phone] office 09/26/2018

## 2018-09-26 NOTE — Care Management Important Message (Signed)
Important Message  Patient Details  Name: Blake Hines MRN: [id number] Date of Birth: 1931/03/09   Medicare Important Message Given:  Yes    Kerin Salen 09/26/2018, 11:33 AMImportant Message  Patient Details  Name: Blake Hines MRN: [id number] Date of Birth: 1931-03-07   Medicare Important Message Given:  Yes    Kerin Salen 09/26/2018, 11:33 AM

## 2018-09-26 NOTE — Progress Notes (Signed)
LCSW following for dc planning.  Patient dcing to [facility] in [place] with Hospice.   Facility stated that they will send a nurse out for anther evaluation.   LCSW made hospice referral to [facility] at [facility].   Patient family moving patients belongings to facility today.   Patient needs TB test read prior to dc.   LCSW will continue to follow.   Carolin Coy [place]

## 2018-09-26 NOTE — Progress Notes (Signed)
COMMUNITY PALLIATIVE CARE RN NOTE  PATIENT NAME: Blake Hines DOB: Mar 04, 1931 MRN: [id number]  PRIMARY CARE PROVIDER: Crist Infante, MD  RESPONSIBLE PARTY:  Acct ID - Guarantor Home Phone Work Phone Relationship Acct Type  [id number] Eden [phone]  Self P/F     [place], [place], [place] [address]    PLAN OF CARE and INTERVENTION:  1. ADVANCE CARE PLANNING/GOALS OF CARE: Patient wants to continue Physical Therapy and continue infusions for his kidney CA. He is a DNR. 2. PATIENT/CAREGIVER EDUCATION: Explained Palliative Care Services and Hospice Education Provided 3. DISEASE STATUS: Joint visit made with Palliative Care SW, Lynn Duffy. Met with patient and daughter [name], in his home. Daughter was able to provide patient health history and information regarding recent hospitalization. Daughter states that 2 weeks ago that patient was able to drive and perform all ADLs independently. Since then, patient is now requires 1 person assistance with bathing, dressing, toileting, transfers and ambulation. He remains able to feed himself. He is currently working with PT/OT for strengthening. He was able to ambulate about 20-30 ft to the kitchen and back with walker and 1 person assistance and was able to sit and stand in a chair 5 times with PT. Most of the time, patient is only able to stand and pivot to the bedside commode with assistance. Progressive generalized weakness. Spends most of the day in bed. He is O2 dependent, between 2-4L via [place]. He is incontinent of bladder and wears Depends. Condom catheters arrived today to help patient and daughter rest more during the night, as he has to urinate several times throughout the night. His intake has decreased. Does better with softer foods. Denies dysphagia. He is a diabetic and CBGs are checked BID. He is also Insulin dependent. Patient has [facility] home [facility] and their nurse Inez Catalina, came out to draw a CMET and CBC. Patient also has hired  caregivers that started 2 days ago through [employer] from 10a-2p to help with patient care. He is able to answer questions. Some words are difficult to understand. He is alert and oriented x 3. Daughter is ready to pursue hospice services for added support, however patient states that he wants to continue PT/OT to get stronger and continue infusions for his Cancer. Next infusion scheduled for 9/27.   HISTORY OF PRESENT ILLNESS:  This is a 85 yo male who resides in his home. Daughter has been staying with patient 24/7 to coordinate and assist with personal care. Palliative Care Team asked to follow patient to for symptom management and goals of care. Next visit scheduled for 09/25/18.   CODE STATUS: DNR  ADVANCED DIRECTIVES: Y MOST FORM: no PPS: 30%   PHYSICAL EXAM:   VITALS: Today's Vitals   09/20/18 0958  BP: 130/70  Pulse: (!) 108  Resp: 18  Temp: 98 F (36.7 C)  TempSrc: Temporal  SpO2: 91%  PainSc: 0-No pain    LUNGS: decreased breath sounds CARDIAC: Cor RRR EXTREMITIES: No edema SKIN: Pale in appearance, exposed skin is dry and intact  NEURO: Alert and oriented x 3, intermittent confusion, some speech difficult to understand, progressive weakness, requires 1 person assistance with all ADLs    (Duration of visit and documentation 110 minutes)   [name], RN, BSN

## 2018-09-26 NOTE — Progress Notes (Signed)
PROGRESS NOTE  Blake Hines  JXB:[id number] DOB: 1930-12-17 DOA: 09/23/2018 PCP: Crist Infante, MD  Outpatient Specialists: Oncology, Dr. Julien Nordmann Brief Narrative: Blake Hines is an 85 y.o. male with a history of metastatic RCC s/p nephrectomy on immunotherapy, stage III CKD, COPD on home oxygen, PE recently started on lovenox, hyponatremia on democlocycline, hypercalcemia, T2DM, HTN, and bipolar disorder who presented due to increased cough productive of yellow sputum and weakness, also with his daughter having noticed coughing with liquid administration. He had been doing well following discharge on 2/15 (admitted for dehydration, found to be hyponatremic, hypercalcemic, and with PE), but symptoms progressed quickly over 2 days prompting ED visit. His daughter also reports paranoia developing at home. His psychiatrist was to make a home visit on the day of arrival, but his symptoms prompted ED visit on 2/23 where creatinine was elevated, and CXR showed worsening left lower lobe infiltrate concerning for aspiration pneumonia. Zosyn and speech therapy consultation were ordered, and he was admitted with IV fluids. Psychiatry and palliative care also consulted and the patient's oncologist, Dr. Julien Nordmann, was contacted for assessment.  Assessment & Plan: Principal Problem:   Delirium Active Problems:   Metastatic renal cell carcinoma to lung, left (HCC)   Weakness generalized   AKI (acute kidney injury) ([place])   Type 2 diabetes mellitus without complication (HCC)   Bipolar disorder ([place])   Acute respiratory failure with hypoxia ([place])   Aspiration pneumonia ([place])   Hypercalcemia   Palliative care by specialist   DNR (do not resuscitate)  Acute on chronic hypoxic respiratory failure due to aspiration pneumonia in patient with COPD: BNP, troponin, flu all normal/negative. - Continue 3L O2 if needed to maintain SpO2 >90% and/or for comfort.  - SLP evaluation: Nectar thickened liquids, dysphagia 3 diet  recommended, but can take normal liquids between meals. - Continue abx, transition unasyn to augmentin. Has remained afebrile. no leukocytosis. Anticipate 5 day course to conclude on 2/27, but will continue monitoring sputum culture which remains pending currently. - Aspiration precautions - Continue bronchodilators, anoro ellipta - Developing right pleural effusion noted on exam and U/S, will stop IVF's.   Acute renal failure on stage III CKD in pt s/p nephrectomy: Now improving/stable. Renal U/S showed medical renal disease in right kidney with increased echogenicity. No hydronephrosis.  - Avoid nephrotoxins.   Metastatic renal cell CA s/p nephrectomy on immunotherapy: Patient and family discussed with Dr. Julien Nordmann and palliative care while hospitalized and came to the plan of discontinuing treatment, discharging to ALF with hospice care. This will [place] in [place] working very diligently on this. PPD placed 2/25 and will be interpreted prior to discharge per their request.  Bipolar disorder: with recent suicidality and paranoid features per his daughter at bedside. Suspect symptoms worsened with discontinuation of zoloft.  - Psychiatry recommended to continue current management: Zyprexa 5mg  qHS. Symptoms appear controlled throughout hospitalization.  T2DM: HbA1c 7.6% - Continue mod SSI and CBG AC/HS. Aim mostly to avoid hypoglycemia in this hospice patient.  Hyponatremia, SIADH: Na improved at 132, DC'ed ARB, HCTZ, SSRI at recent admission - Continue democlocycline   PE:  - Continue lovenox, which was chosen to decrease pill burden  HTN:  - Continue metoprolol and norvasc (substituted for ARB/HCTZ at recent admission)  Hypercalcemia of malignancy: Improved from previous. s/p Aredia 2/12. - Monitor  Anemia of chronic disease:  - Monitor hgb  DVT prophylaxis: Treatment dose lovenox Code Status: DNR Family Communication: None at bedside Disposition Plan:  Plan to DC to ALF with  hospice care. He is stable for discharge, though will wait until 2/27 to have PPD interpreted per ALF request. DME including O2 and hospital bed have been ordered.   Consultants:   Psychiatry  Palliative care medicine  Oncology  Procedures:   None  Antimicrobials:  Unasyn   Subjective: Ate good breakfast, no trouble breathing from baseline, no chest pain or other complaints. Cough is stable.  Objective: Vitals:   09/25/18 1444 09/25/18 1937 09/26/18 0535 09/26/18 0845  BP: (!) 150/72 (!) 149/72 (!) 141/82   Pulse:  (!) 101 (!) 102   Resp:  16 15   Temp: 99 F (37.2 C) 98 F (36.7 C) 98 F (36.7 C)   TempSrc: Oral Oral Oral   SpO2:  95% 96% 96%  Weight:      Height:        Intake/Output Summary (Last 24 hours) at 09/26/2018 1443 Last data filed at 09/26/2018 1206 Gross per 24 hour  Intake 1742.5 ml  Output 3800 ml  Net -2057.5 ml   Filed Weights   09/23/18 1606  Weight: 73.7 kg   Gen: 85 y.o. male in no distress Pulm: Nonlabored breathing room air. Diminished right lower zone, LLL crackles stable. CV: Regular rate and rhythm. No murmur, rub, or gallop. No JVD, trace dependent edema. GI: Abdomen soft, non-tender, non-distended, with normoactive bowel sounds.  Ext: Warm, no deformities Skin: No new rashes, lesions or ulcers on visualized skin. Neuro: Alert, conversant, no focal neurological deficits. Psych: Judgement and insight appear limited. Mood euthymic & affect congruent. Behavior is appropriate.    Data Reviewed: I have personally reviewed following labs and imaging studies  CBC: Recent Labs  Lab 09/23/18 0924 09/24/18 0659  WBC 10.2 9.9  NEUTROABS 7.2  --   HGB 11.9* 11.5*  HCT 37.4* 37.3*  MCV 88.4 88.6  PLT 333 785   Basic Metabolic Panel: Recent Labs  Lab 09/23/18 0924 09/24/18 0659 09/25/18 0613 09/26/18 0644  NA 132* 130* 131* 132*  K 4.1 4.4 4.4 4.1  CL 95* 94* 96* 100  CO2 31 28 28 25   GLUCOSE 138* 139* 125* 138*  BUN 39*  38* 37* 35*  CREATININE 2.59* 2.52* 2.77* 2.35*  CALCIUM 11.6* 11.2* 10.7* 10.3  MG 1.7  --   --   --    GFR: Estimated Creatinine Clearance: 22.2 mL/min (A) (by C-G formula based on SCr of 2.35 mg/dL (H)). Liver Function Tests: Recent Labs  Lab 09/23/18 0924 09/24/18 0659  AST 39 29  ALT 80* 67*  ALKPHOS 56 55  BILITOT 0.3 0.7  PROT 6.0* 5.6*  ALBUMIN 2.5* 2.4*   No results for input(s): LIPASE, AMYLASE in the last 168 hours. No results for input(s): AMMONIA in the last 168 hours. Coagulation Profile: No results for input(s): INR, PROTIME in the last 168 hours. Cardiac Enzymes: Recent Labs  Lab 09/23/18 0924  TROPONINI <0.03   BNP (last 3 results) No results for input(s): PROBNP in the last 8760 hours. HbA1C: Recent Labs    09/24/18 0659  HGBA1C 7.6*   CBG: Recent Labs  Lab 09/25/18 1125 09/25/18 1658 09/25/18 2041 09/26/18 0801 09/26/18 1209  GLUCAP 192* 171* 197* 126* 275*   Lipid Profile: No results for input(s): CHOL, HDL, LDLCALC, TRIG, CHOLHDL, LDLDIRECT in the last 72 hours. Thyroid Function Tests: No results for input(s): TSH, T4TOTAL, FREET4, T3FREE, THYROIDAB in the last 72 hours. Anemia Panel: No results for input(s): VITAMINB12,  FOLATE, FERRITIN, TIBC, IRON, RETICCTPCT in the last 72 hours. Urine analysis:    Component Value Date/Time   COLORURINE YELLOW 09/25/2018 1620   APPEARANCEUR CLEAR 09/25/2018 1620   LABSPEC 1.008 09/25/2018 1620   PHURINE 7.0 09/25/2018 1620   GLUCOSEU NEGATIVE 09/25/2018 1620   HGBUR NEGATIVE 09/25/2018 1620   BILIRUBINUR NEGATIVE 09/25/2018 1620   KETONESUR NEGATIVE 09/25/2018 1620   PROTEINUR NEGATIVE 09/25/2018 1620   NITRITE NEGATIVE 09/25/2018 1620   LEUKOCYTESUR NEGATIVE 09/25/2018 1620   No results found for this or any previous visit (from the past 240 hour(s)).    Radiology Studies: US Renal  Result Date: 09/25/2018 CLINICAL DATA:  85 year old male with acute kidney insufficiency. Post left  nephrectomy. Renal cell carcinoma. Initial encounter. EXAM: RENAL / URINARY TRACT ULTRASOUND COMPLETE COMPARISON:  PET CT 08/10/2018 FINDINGS: Right Kidney: Renal measurements: 13.5 x 5.1 x 5.7 cm = volume: 26.5 mL. Increased renal parenchyma echogenicity. No hydronephrosis. 3 cysts largest measuring up to 2.3 cm. Left Kidney: Post nephrectomy. Bladder: Partial contracted with circumferential wall thickening. Prostate gland slightly prominent measuring up to 5.3 cm with slight impression upon the bladder base. Right-sided pleural effusion noted.  Spleen top-normal size. IMPRESSION: 1. Increased right renal parenchyma echogenicity may reflect result of medical renal disease. 2. No right-sided hydronephrosis. 3. 3 right renal cysts without worrisome characteristics. 4. Post left nephrectomy. 5. Partial contracted urinary bladder with circumferential wall thickening. 6. Prostate gland slightly prominent measuring up to 5.3 cm with slight impression upon the bladder base. 7. Right-sided pleural effusion. 8. Spleen top-normal size. Electronically Signed   By: Genia Del M.D.   On: 09/25/2018 17:56    Scheduled Meds: . amLODipine  5 mg Oral Daily  . amoxicillin-clavulanate  1 tablet Oral BID  . demeclocycline  150 mg Oral BID  . enoxaparin (LOVENOX) injection  70 mg Subcutaneous Q24H  . insulin aspart  0-15 Units Subcutaneous TID WC  . latanoprost  1 drop Both Eyes QHS  . metoprolol tartrate  25 mg Oral BID  . multivitamin with minerals  1 tablet Oral Daily  . OLANZapine  5 mg Oral QHS  . saccharomyces boulardii  250 mg Oral BID  . umeclidinium-vilanterol  1 puff Inhalation Daily   Continuous Infusions:    LOS: 3 days   Time spent: 25 minutes.  Patrecia Pour, MD [facility] [email or web address] Password [email or web address] 09/26/2018, 2:43 PM

## 2018-09-27 ENCOUNTER — Inpatient Hospital Stay: Payer: Medicare Other | Attending: Internal Medicine | Admitting: Internal Medicine

## 2018-09-27 ENCOUNTER — Inpatient Hospital Stay: Payer: Medicare Other

## 2018-09-27 LAB — GLUCOSE, CAPILLARY
Glucose-Capillary: 111 mg/dL — ABNORMAL HIGH (ref 70–99)
Glucose-Capillary: 214 mg/dL — ABNORMAL HIGH (ref 70–99)
Glucose-Capillary: 172 mg/dL — ABNORMAL HIGH (ref 70–99)

## 2018-09-27 NOTE — Discharge Summary (Addendum)
Physician Discharge Summary  Blake Hines:[id number] DOB: 15-Jun-1931 DOA: 09/23/2018  PCP: Crist Infante, MD  Admit date: 09/23/2018 Discharge date: 09/27/2018  Admitted From: Home Disposition:  Home  Discharge Condition:Stable CODE STATUS: DNR Diet recommendation: Dysphagia 3  Brief/Interim Summary: Blake Hines is an 85 y.o. male with a history of metastatic RCC s/p nephrectomy on immunotherapy, stage III CKD, COPD on home oxygen, PE recently started on lovenox, hyponatremia on democlocycline, hypercalcemia, T2DM, HTN, and bipolar disorder who presented due to increased cough productive of yellow sputum and weakness, also with his daughter having noticed coughing with liquid administration. He had been doing well following discharge on 2/15 (admitted for dehydration, found to be hyponatremic, hypercalcemic, and with PE), but symptoms progressed quickly over 2 days prompting ED visit. His daughter also reports paranoia developing at home. His psychiatrist was to make a home visit on the day of arrival, but his symptoms prompted ED visit on 2/23 where creatinine was elevated, and CXR showed worsening left lower lobe infiltrate concerning for aspiration pneumonia. Zosyn and speech therapy consultation were ordered, and he was admitted with IV fluids. Psychiatry and palliative care also consulted and the patient's oncologist, Dr. Julien Nordmann, was contacted for assessment. Patient's respiratory status is stable now.  He will be discharged today to assisted living facility with hospice follow-up.  Following problems were addressed during his hospitalization:  Acute on chronic hypoxic respiratory failure due to aspiration pneumonia in patient with COPD: BNP, troponin, flu all normal/negative. - Continue supplemental oxygen as needed to maintain SpO2 >90% and/or for comfort.  - SLP evaluation: Nectar thickened liquids, dysphagia 3 diet recommended, but can take normal liquids between meals. - Continue  abx, transition unasyn to augmentin. Has remained afebrile. no leukocytosis. - Aspiration precautions - Continue bronchodilators, anoro ellipta  Acute renal failure on stage III CKD in pt s/p nephrectomy: Now improving/stable. Renal U/S showed medical renal disease in right kidney with increased echogenicity. No hydronephrosis.  Avoid nephrotoxins.   Metastatic renal cell CA s/p nephrectomy on immunotherapy: Patient and family discussed with Dr. Julien Nordmann and palliative care while hospitalized and came to the plan of discontinuing treatment, discharging to ALF with hospice care. This will be in [facility]. PPD placed 2/25 and and found to be negative.  Bipolar disorder: Suspect symptoms worsened with discontinuation of zoloft.  Psychiatry recommended to continue current management: Zyprexa 5mg  qHS. Symptoms appear controlled throughout hospitalization.  T2DM: HbA1c 7.6% - Continue previous home regimen.  Hyponatremia, SIADH: Na improved at 132, DC'ed ARB, HCTZ, SSRI at recent admission - Continue democlocycline   PE:  - Takes Lovenox at home. Continue.  HTN:  - Continue metoprolol and norvasc (substituted for ARB/HCTZ at recent admission)  Hypercalcemia of malignancy: Improved from previous. s/p Aredia 2/12. - Monitor  Anemia of chronic disease:  - Monitor hgb as an outpatient  Discharge Diagnoses:  Principal Problem:   Delirium Active Problems:   Metastatic renal cell carcinoma to lung, left (HCC)   Weakness generalized   AKI (acute kidney injury) ([place])   Type 2 diabetes mellitus without complication (HCC)   Bipolar disorder ([place])   Acute respiratory failure with hypoxia ([place])   Aspiration pneumonia ([place])   Hypercalcemia   Palliative care by specialist   DNR (do not resuscitate)    Discharge Instructions  Discharge Instructions    Diet - low sodium heart healthy   Complete by:  As directed    Discharge instructions   Complete by:  As directed  1)Do   CBC and BMP tests in a week   Increase activity slowly   Complete by:  As directed      Allergies as of 09/27/2018   No Known Allergies     Medication List    TAKE these medications   amLODipine 5 MG tablet Commonly known as:  NORVASC Take 1 tablet (5 mg total) by mouth daily.   ANORO ELLIPTA 62.5-25 MCG/INH Aepb Generic drug:  umeclidinium-vilanterol Inhale 1 puff into the lungs daily.   CoQ10 200 MG Caps Take 200 mg by mouth daily.   demeclocycline 150 MG tablet Commonly known as:  DECLOMYCIN Take 1 tablet (150 mg total) by mouth 2 (two) times daily.   enoxaparin 80 MG/0.8ML injection Commonly known as:  LOVENOX Inject 0.8 mLs (80 mg total) into the skin daily.   fenofibrate 48 MG tablet Commonly known as:  TRICOR Take 48 mg by mouth daily.   HUMALOG KWIKPEN 100 UNIT/ML KwikPen Generic drug:  insulin lispro Inject 4 Units into the skin every evening. With dinner   latanoprost 0.005 % ophthalmic solution Commonly known as:  XALATAN Place 1 drop into both eyes at bedtime.   metoprolol tartrate 25 MG tablet Commonly known as:  LOPRESSOR Take 1 tablet (25 mg total) by mouth 2 (two) times daily.   multivitamin with minerals tablet Take 1 tablet by mouth daily.   OLANZapine 5 MG tablet Commonly known as:  ZYPREXA Take 5 mg by mouth at bedtime.   senna-docusate 8.6-50 MG tablet Commonly known as:  Senokot-S Take 1 tablet by mouth 2 (two) times daily. For constipation   sitaGLIPtin 50 MG tablet Commonly known as:  JANUVIA Take 1 tablet (50 mg total) by mouth every morning. Hold until kidney function has normalized   TOUJEO SOLOSTAR 300 UNIT/ML Sopn Generic drug:  Insulin Glargine (1 Unit Dial) Inject 6 Units into the skin daily.            Durable Medical Equipment  (From admission, onward)         Start     Ordered   09/26/18 1127  For home use only DME Hospital bed  Once    Question:  Bed type  Answer:  Semi-electric   09/26/18 1126    09/26/18 1126  For home use only DME oxygen  Once    Question Answer Comment  Mode or (Route) Nasal cannula   Liters per Minute 3   Frequency Continuous (stationary and portable oxygen unit needed)   Oxygen delivery system Gas      09/26/18 1126         Follow-up Information    Crist Infante, MD. Schedule an appointment as soon as possible for a visit in 1 week(s).   Specialty:  Internal Medicine Contact information: [address] [place] [address] [phone]          No Known Allergies  Consultations:  None   Procedures/Studies: Dg Chest 2 View  Result Date: 09/23/2018 CLINICAL DATA:  Hemoptysis. Renal cell cancer. EXAM: CHEST - 2 VIEW COMPARISON:  Chest CT 09/08/2018 FINDINGS: Cardiomediastinal silhouette is normal. Mediastinal contours appear intact. Calcific atherosclerotic disease and tortuosity of the aorta. Prominence of the hilar regions, likely due to lymphadenopathy or hilar/subhilar masses. Low lung volumes with increasing left lower lobe patchy airspace consolidation. Bilateral small pleural effusions. Known metastatic pulmonary nodules not well seen radiographically. Osseous structures are without acute abnormality. Soft tissues are grossly normal. IMPRESSION: 1. Low lung volumes with increasing  left lower lobe patchy airspace consolidation. 2. Stable right lower lobe patchy airspace consolidation. 3. Bilateral small pleural effusions. 4. Prominence of the hilar regions, likely due to lymphadenopathy or hilar/subhilar masses. Electronically Signed   By: Fidela Salisbury M.D.   On: 09/23/2018 10:43   Dg Chest 2 View  Result Date: 09/04/2018 CLINICAL DATA:  Shortness of breath EXAM: CHEST - 2 VIEW COMPARISON:  08/03/2018 FINDINGS: Patchy bilateral lower lobe airspace opacities are new since prior study concerning for pneumonia. Heart is normal size. No effusions or pneumothorax. No acute bony abnormality. IMPRESSION: Patchy bilateral lower lobe airspace  opacities concerning for pneumonia. Electronically Signed   By: Rolm Baptise M.D.   On: 09/04/2018 10:24   Ct Angio Chest Pe W Or Wo Contrast  Result Date: 09/08/2018 CLINICAL DATA:  History of metastatic renal cell carcinoma. EXAM: CT ANGIOGRAPHY CHEST WITH CONTRAST TECHNIQUE: Multidetector CT imaging of the chest was performed using the standard protocol during bolus administration of intravenous contrast. Multiplanar CT image reconstructions and MIPs were obtained to evaluate the vascular anatomy. CONTRAST:  88mL ISOVUE-370 IOPAMIDOL (ISOVUE-370) INJECTION 76% COMPARISON:  PET-CT 08/10/2018 FINDINGS: Cardiovascular: Normal heart size. Small pericardial effusion. Aorta and main pulmonary artery normal in caliber. Thoracic aortic vascular calcifications. Nonocclusive embolus demonstrated within the distal right lower lobe pulmonary artery (image 164; series 5). Additionally embolus is demonstrated within the right middle lobe segmental and subsegmental pulmonary arteries. Small amount of embolic material demonstrated within the right upper lobe pulmonary arteries. RV/LV ratio less than 1. Mediastinum/Nodes: Re demonstrated extensive mediastinal and hilar adenopathy. Slight interval increase in size of subcarinal lymph node measuring 4.7 cm (image 53; series 4), previously 4.2 cm. Similar-appearing 2.5 cm left inferior hilar lymph node (image 65; series 4), previously 2.5 cm. Interval increase in size of left hilar lymph node measuring 1.5 cm (image 53; series 4), previously 1.0 cm. Lungs/Pleura: Similar-appearing scattered pulmonary nodules. Interval development of masslike area of consolidation within the left lower lobe measuring 6.1 x 3.9 cm. Additionally there is new predominately subpleural ground-glass and consolidative opacities throughout the left lung and right lower lobe. Upper Abdomen: No acute process. Musculoskeletal: Thoracic spine degenerative changes. No aggressive or acute appearing osseous  lesions identified. Review of the MIP images confirms the above findings. IMPRESSION: 1. Findings positive for pulmonary embolus predominately within the right middle and right lower lobes as well as right upper lobe. 2. Interval development of a large (6 cm) masslike area of consolidation within the left lower lobe. Additionally there is new patchy predominately subpleural ground-glass and consolidative opacities throughout the left lung and right lower lobe. Overall findings are concerning for multifocal infectious process. Possibility of superimposed edema not excluded. 3. Interval increase in size of mediastinal and hilar adenopathy. 4. Re demonstrated scattered pulmonary nodules compatible with known metastatic disease. 5. Critical Value/emergent results were called by telephone at the time of interpretation on 09/08/2018 at 6:54 pm to Dr. Vernell Leep , who verbally acknowledged these results. Electronically Signed   By: Lovey Newcomer M.D.   On: 09/08/2018 18:56   US Renal  Result Date: 09/25/2018 CLINICAL DATA:  85 year old male with acute kidney insufficiency. Post left nephrectomy. Renal cell carcinoma. Initial encounter. EXAM: RENAL / URINARY TRACT ULTRASOUND COMPLETE COMPARISON:  PET CT 08/10/2018 FINDINGS: Right Kidney: Renal measurements: 13.5 x 5.1 x 5.7 cm = volume: 26.5 mL. Increased renal parenchyma echogenicity. No hydronephrosis. 3 cysts largest measuring up to 2.3 cm. Left Kidney: Post nephrectomy. Bladder: Partial contracted with  circumferential wall thickening. Prostate gland slightly prominent measuring up to 5.3 cm with slight impression upon the bladder base. Right-sided pleural effusion noted.  Spleen top-normal size. IMPRESSION: 1. Increased right renal parenchyma echogenicity may reflect result of medical renal disease. 2. No right-sided hydronephrosis. 3. 3 right renal cysts without worrisome characteristics. 4. Post left nephrectomy. 5. Partial contracted urinary bladder with  circumferential wall thickening. 6. Prostate gland slightly prominent measuring up to 5.3 cm with slight impression upon the bladder base. 7. Right-sided pleural effusion. 8. Spleen top-normal size. Electronically Signed   By: Genia Del M.D.   On: 09/25/2018 17:56   Nm Pulmonary Perf And Vent  Result Date: 09/07/2018 CLINICAL DATA:  Stage IV renal cell carcinoma. Elevated D-dimer. Clinical concern for pulmonary embolism. COPD. EXAM: NUCLEAR MEDICINE VENTILATION - PERFUSION LUNG SCAN TECHNIQUE: Ventilation images were obtained in multiple projections using inhaled aerosol Tc-67m DTPA. Perfusion images were obtained in multiple projections after intravenous injection of Tc-83m MAA. RADIOPHARMACEUTICALS:  30.2 mCi of Tc-6m DTPA aerosol inhalation and 4.2 mCi Tc67m MAA IV COMPARISON:  09/06/2018 chest radiograph. FINDINGS: Ventilation: Limited heterogeneous radiotracer throughout both lungs on the ventilation images, with central airway radiotracer accumulation. Perfusion: Numerous large matched segmental perfusion defects throughout both lungs, including within the lower lung zones. IMPRESSION: Intermediate probability for pulmonary embolism (20-79%) by PIOPED II criteria. Electronically Signed   By: Ilona Sorrel M.D.   On: 09/07/2018 23:23   Dg Chest Port 1 View  Result Date: 09/06/2018 CLINICAL DATA:  Shortness of breath EXAM: PORTABLE CHEST 1 VIEW COMPARISON:  Two days ago FINDINGS: Asymmetric left lower lung opacity compatible with pneumonia. Pulmonary metastatic disease based on recent PET-CT. COPD. Normal heart size. Aortic tortuosity. IMPRESSION: 1. Unchanged asymmetric left lower pulmonary opacity primarily concerning for pneumonia. 2. Pulmonary metastatic disease. Electronically Signed   By: Monte Fantasia M.D.   On: 09/06/2018 10:31   Vas [place] Lower Extremity Venous (dvt)  Result Date: 09/08/2018  Lower Venous Study Indications: Pulmonary embolism.  Performing Technologist: Abram Sander RVS   Examination Guidelines: A complete evaluation includes B-mode imaging, spectral Doppler, color Doppler, and power Doppler as needed of all accessible portions of each vessel. Bilateral testing is considered an integral part of a complete examination. Limited examinations for reoccurring indications may be performed as noted.  Right Venous Findings: +---------+---------------+---------+-----------+----------+-------+          CompressibilityPhasicitySpontaneityPropertiesSummary +---------+---------------+---------+-----------+----------+-------+ CFV      Full           Yes      Yes                          +---------+---------------+---------+-----------+----------+-------+ SFJ      Full                                                 +---------+---------------+---------+-----------+----------+-------+ FV Prox  Full                                                 +---------+---------------+---------+-----------+----------+-------+ FV Mid   Full                                                 +---------+---------------+---------+-----------+----------+-------+  FV DistalFull                                                 +---------+---------------+---------+-----------+----------+-------+ PFV      Full                                                 +---------+---------------+---------+-----------+----------+-------+ POP      Full           Yes      Yes                          +---------+---------------+---------+-----------+----------+-------+ PTV      Full                                                 +---------+---------------+---------+-----------+----------+-------+ PERO     Full                                                 +---------+---------------+---------+-----------+----------+-------+  Left Venous Findings: +---------+---------------+---------+-----------+----------+-------+           CompressibilityPhasicitySpontaneityPropertiesSummary +---------+---------------+---------+-----------+----------+-------+ CFV      Full           Yes      Yes                          +---------+---------------+---------+-----------+----------+-------+ SFJ      Full                                                 +---------+---------------+---------+-----------+----------+-------+ FV Prox  Full                                                 +---------+---------------+---------+-----------+----------+-------+ FV Mid   Full                                                 +---------+---------------+---------+-----------+----------+-------+ FV DistalFull                                                 +---------+---------------+---------+-----------+----------+-------+ PFV      Full                                                 +---------+---------------+---------+-----------+----------+-------+  POP      Full           Yes      Yes                          +---------+---------------+---------+-----------+----------+-------+ PTV      Full                                                 +---------+---------------+---------+-----------+----------+-------+ PERO     Full                                                 +---------+---------------+---------+-----------+----------+-------+    Summary: Right: There is no evidence of deep vein thrombosis in the lower extremity. No cystic structure found in the popliteal fossa. Left: There is no evidence of deep vein thrombosis in the lower extremity. No cystic structure found in the popliteal fossa.  *See table(s) above for measurements and observations. Electronically signed by Monica Martinez MD on 09/08/2018 at 10:29:12 AM.    Final        Subjective: Patient seen and examined the bedside this morning.  Remains comfortable.  Hemodynamically stable for discharge today.  Discharge Exam: Vitals:    09/27/18 0528 09/27/18 0913  BP: (!) 147/76   Pulse: 97   Resp: 20   Temp: 97.8 F (36.6 C)   SpO2: 96% 97%   Vitals:   09/26/18 1410 09/26/18 2000 09/27/18 0528 09/27/18 0913  BP: 140/66 (!) 148/79 (!) 147/76   Pulse: 99 (!) 110 97   Resp: 17 14 20    Temp: 98.1 F (36.7 C) 98.9 F (37.2 C) 97.8 F (36.6 C)   TempSrc: Oral Oral    SpO2: 98% 94% 96% 97%  Weight:      Height:        General: Pt is alert, awake, not in acute distress Cardiovascular: RRR, S1/S2 +, no rubs, no gallops Respiratory: Bilateral basal crackles. Abdominal: Soft, NT, ND, bowel sounds + Extremities: no edema, no cyanosis    The results of significant diagnostics from this hospitalization (including imaging, microbiology, ancillary and laboratory) are listed below for reference.     Microbiology: No results found for this or any previous visit (from the past 240 hour(s)).   Labs: BNP (last 3 results) Recent Labs    09/23/18 0924  BNP 77.8   Basic Metabolic Panel: Recent Labs  Lab 09/23/18 0924 09/24/18 0659 09/25/18 0613 09/26/18 0644  NA 132* 130* 131* 132*  K 4.1 4.4 4.4 4.1  CL 95* 94* 96* 100  CO2 31 28 28 25   GLUCOSE 138* 139* 125* 138*  BUN 39* 38* 37* 35*  CREATININE 2.59* 2.52* 2.77* 2.35*  CALCIUM 11.6* 11.2* 10.7* 10.3  MG 1.7  --   --   --    Liver Function Tests: Recent Labs  Lab 09/23/18 0924 09/24/18 0659  AST 39 29  ALT 80* 67*  ALKPHOS 56 55  BILITOT 0.3 0.7  PROT 6.0* 5.6*  ALBUMIN 2.5* 2.4*   No results for input(s): LIPASE, AMYLASE in the last 168 hours. No results for input(s): AMMONIA in the last 168 hours. CBC: Recent Labs  Lab 09/23/18  2446 09/24/18 0659  WBC 10.2 9.9  NEUTROABS 7.2  --   HGB 11.9* 11.5*  HCT 37.4* 37.3*  MCV 88.4 88.6  PLT 333 334   Cardiac Enzymes: Recent Labs  Lab 09/23/18 0924  TROPONINI <0.03   BNP: Invalid input(s): POCBNP CBG: Recent Labs  Lab 09/26/18 0801 09/26/18 1209 09/26/18 1640 09/26/18 2328  09/27/18 0733  GLUCAP 126* 275* 108* 139* 111*   D-Dimer No results for input(s): DDIMER in the last 72 hours. Hgb A1c No results for input(s): HGBA1C in the last 72 hours. Lipid Profile No results for input(s): CHOL, HDL, LDLCALC, TRIG, CHOLHDL, LDLDIRECT in the last 72 hours. Thyroid function studies No results for input(s): TSH, T4TOTAL, T3FREE, THYROIDAB in the last 72 hours.  Invalid input(s): FREET3 Anemia work up No results for input(s): VITAMINB12, FOLATE, FERRITIN, TIBC, IRON, RETICCTPCT in the last 72 hours. Urinalysis    Component Value Date/Time   COLORURINE YELLOW 09/25/2018 1620   APPEARANCEUR CLEAR 09/25/2018 1620   LABSPEC 1.008 09/25/2018 1620   PHURINE 7.0 09/25/2018 1620   GLUCOSEU NEGATIVE 09/25/2018 1620   HGBUR NEGATIVE 09/25/2018 1620   [place] 09/25/2018 1620   [place] 09/25/2018 1620   PROTEINUR NEGATIVE 09/25/2018 1620   NITRITE NEGATIVE 09/25/2018 1620   LEUKOCYTESUR NEGATIVE 09/25/2018 1620   Sepsis Labs Invalid input(s): PROCALCITONIN,  WBC,  LACTICIDVEN Microbiology No results found for this or any previous visit (from the past 240 hour(s)).  Please note: You were cared for by a hospitalist during your hospital stay. Once you are discharged, your primary care physician will handle any further medical issues. Please note that NO REFILLS for any discharge medications will be authorized once you are discharged, as it is imperative that you return to your primary care physician (or establish a relationship with a primary care physician if you do not have one) for your post hospital discharge needs so that they can reassess your need for medications and monitor your lab values.    Time coordinating discharge: 40 minutes  SIGNED:   Shelly Coss, MD  [facility] 09/27/2018, 11:41 AM Pager [phone]  If 7PM-7AM, please contact night-coverage [email or web address] Password [email or web address]

## 2018-09-27 NOTE — Progress Notes (Addendum)
2:21 PM LCSW faxed updated dc summary to facility.    1:24 Patient to dc to [facility] in [place].  Facility requesting changes to dc summary. LCSW notified attending via text page. Attending stated that Lovonox could not be changed, due to being an at home med. Attending agreed to other changes.   LCSW notified facility. Facility is reaching out to pt PCP to see if it can be changed. Patient can not dc to ALF with IM med.  LCSW will continue to follow.    Carolin Coy [place]

## 2018-09-27 NOTE — Progress Notes (Signed)
TB skin test administered in right forearm 2 days ago.  No observable areas in right forearm that would be concerning.  Has a number of bruises, but it does not appear to be related.

## 2018-09-27 NOTE — Care Management Note (Signed)
Case Management Note  Patient Details  Name: ULIS KAPS MRN: [id number] Date of Birth: Oct 21, 1930  Subjective/Objective:                  DISCHARGED  Action/Plan: ORDERS CHECKED FOR NEEDS NONE FOUND  Expected Discharge Date:  09/27/18               Expected Discharge Plan:  Home/Self Care  In-House Referral:     Discharge planning Services  CM Consult  Post Acute Care Choice:    Choice offered to:     DME Arranged:    DME Agency:     HH Arranged:    HH Agency:     Status of Service:  Completed, signed off  If discussed at [employer] of Stay Meetings, dates discussed:    Additional Comments:  Leeroy Cha, RN 09/27/2018, 2:05 PM

## 2018-09-27 NOTE — Clinical Social Work Placement (Signed)
D/C Summary / FL2 / TB results sent.  Nursing Director reviewing clinicals at this time.   CLINICAL SOCIAL WORK PLACEMENT  NOTE  Date:  09/27/2018  Patient Details  Name: Blake Hines MRN: [id number] Date of Birth: 1930-08-28  Clinical Social Work is seeking post-discharge placement for this patient at the Assisted Living Facility(Hospice Services) level of care (*CSW will initial, date and re-position this form in  chart as items are completed):  Yes   Patient/family provided with [place] Work Department's list of facilities offering this level of care within the geographic area requested by the patient (or if unable, by the patient's family).  Yes   Patient/family informed of their freedom to choose among providers that offer the needed level of care, that participate in Medicare, Medicaid or managed care program needed by the patient, have an available bed and are willing to accept the patient.  Yes   Patient/family informed of [facility]'s ownership interest in [facility] and [facility], as well as of the fact that they are under no obligation to receive care at these facilities.  PASRR submitted to EDS on       PASRR number received on       Existing PASRR number confirmed on       FL2 transmitted to all facilities in geographic area requested by pt/family on       FL2 transmitted to all facilities within larger geographic area on       Patient informed that his/her managed care company has contracts with or will negotiate with certain facilities, including the following:        Yes   Patient/family informed of bed offers received.  Patient chooses bed at    [facility]   Physician recommends and patient chooses bed at     Patient to be transferred to   [facility]  on 09/27/18.  Patient to be transferred to facility by [facility]     Patient family notified on 09/27/18 of transfer.  Name of family member notified:  Daughter  [name]      PHYSICIAN Please sign DNR     Additional Comment:    _______________________________________________ Lia Hopping, [place] 09/27/2018, 12:42 PM

## 2018-09-27 NOTE — Care Management Note (Signed)
Case Management Note  Patient Details  Name: KARDER GOODIN MRN: [id number] Date of Birth: 05/16/1931  Subjective/Objective:                  Discharge Readiness Return to top of Pneumonia RRG - [place]  Discharge readiness is indicated by patient meeting Recovery Milestones, including ALL of the following: ? Hemodynamic stability YES ? Tachypnea absent YES ? Hypoxemia absent YES ? Afebrile, or temperature acceptable for next level of care YES ? Oxygen absent or at baseline need YES ? Mental status at baseline YES ? Antibiotic regimen acceptable for next level of care YES ? Ambulatory YES ? Oral hydration, medications, and diet REGULAR ? Discharge plans and education understood YES   Action/Plan: DISCHARGED TO HOME Discharged to home with self-care, orders checked for hhc needs. No CM needs present at time of discharge.  Patient is able to arrangement own appointments and home care. Expected Discharge Date:  09/27/18               Expected Discharge Plan:  Home/Self Care  In-House Referral:     Discharge planning Services  CM Consult  Post Acute Care Choice:    Choice offered to:     DME Arranged:    DME Agency:     HH Arranged:    HH Agency:     Status of Service:  Completed, signed off  If discussed at [employer] of Stay Meetings, dates discussed:    Additional Comments:  Leeroy Cha, RN 09/27/2018, 3:19 PM

## 2018-09-27 NOTE — Progress Notes (Signed)
Report called to [place] at [facility].

## 2018-09-27 NOTE — Progress Notes (Signed)
Patient to dc to [facility] in [place].   LCSW faxed dc docs.   Patient to transport by [facility].   RN report number [phone]  Servando Snare, [place] [phone]

## 2018-09-27 NOTE — NC FL2 (Signed)
[place] LEVEL OF CARE SCREENING TOOL     IDENTIFICATION  Patient Name: Blake Hines Birthdate: July 06, 1931 Sex: male Admission Date (Current Location): 09/23/2018  [facility] and [place] Number:  Herbalist and Address:  [facility],  [place] [address] [place]      Provider Number: [id number]  Attending Physician Name and Address:  Shelly Coss, MD  Relative Name and Phone Number:       Current Level of Care: SNF Recommended Level of Care: [place] Prior Approval Number:    Date Approved/Denied:   PASRR Number:    Discharge Plan: Other (Comment)(ALF)    Current Diagnoses: Patient Active Problem List   Diagnosis Date Noted  . Delirium   . Palliative care by specialist   . DNR (do not resuscitate)   . Aspiration pneumonia ([place]) 09/23/2018  . Hypercalcemia 09/23/2018  . Acute respiratory failure with hypoxia ([place]) 09/12/2018  . Hyponatremia 09/04/2018  . AKI (acute kidney injury) ([place]) 09/04/2018  . Type 2 diabetes mellitus without complication ([place]) 62/22/9798  . Bipolar disorder ([place]) 09/04/2018  . Abnormal liver function test 09/04/2018  . Weakness generalized   . Goals of care, counseling/discussion 07/14/2018  . Encounter for antineoplastic immunotherapy 07/14/2018  . Metastatic renal cell carcinoma to lung, left (HCC) 07/12/2018    Orientation RESPIRATION BLADDER Height & Weight     Self  O2(3L hydrated) Continent Weight: 162 lb 8 oz (73.7 kg) Height:  5\' 8"  (172.7 cm)  BEHAVIORAL SYMPTOMS/MOOD NEUROLOGICAL BOWEL NUTRITION STATUS      Continent (Textured Modified Soft )  AMBULATORY STATUS COMMUNICATION OF NEEDS Skin   Extensive Assist Verbally Normal                       Personal Care Assistance Level of Assistance  Bathing, Feeding, Dressing Bathing Assistance: Limited assistance Feeding assistance: Independent Dressing Assistance: Limited assistance     Functional Limitations  Info  Sight, Hearing, Speech Sight Info: Adequate Hearing Info: Adequate Speech Info: Adequate    SPECIAL CARE FACTORS FREQUENCY                       Contractures Contractures Info: Not present    Additional Factors Info  Insulin Sliding Scale Code Status Info: DNR Allergies Info: NKA            Discharge Medications:  Medication List        TAKE these medications       amLODipine 5 MG tablet Commonly known as:  NORVASC Take 1 tablet (5 mg total) by mouth daily.   ANORO ELLIPTA 62.5-25 MCG/INH Aepb Generic drug:  umeclidinium-vilanterol Inhale 1 puff into the lungs daily.   CoQ10 200 MG Caps Take 200 mg by mouth daily.   demeclocycline 150 MG tablet Commonly known as:  DECLOMYCIN Take 1 tablet (150 mg total) by mouth 2 (two) times daily.   enoxaparin 80 MG/0.8ML injection Commonly known as:  LOVENOX Inject 0.8 mLs (80 mg total) into the skin daily.   fenofibrate 48 MG tablet Commonly known as:  TRICOR Take 48 mg by mouth daily.   HUMALOG KWIKPEN 100 UNIT/ML KwikPen Generic drug:  insulin lispro Inject 4 Units into the skin every evening. With dinner   latanoprost 0.005 % ophthalmic solution Commonly known as:  XALATAN Place 1 drop into both eyes at bedtime.   metoprolol tartrate 25 MG tablet Commonly known as:  LOPRESSOR  Take 1 tablet (25 mg total) by mouth 2 (two) times daily.   multivitamin with minerals tablet Take 1 tablet by mouth daily.   OLANZapine 5 MG tablet Commonly known as:  ZYPREXA Take 5 mg by mouth at bedtime.   senna-docusate 8.6-50 MG tablet Commonly known as:  Senokot-S Take 1 tablet by mouth 2 (two) times daily. For constipation   sitaGLIPtin 50 MG tablet Commonly known as:  JANUVIA Take 1 tablet (50 mg total) by mouth every morning. Hold until kidney function has normalized   TOUJEO SOLOSTAR 300 UNIT/ML Sopn Generic drug:  Insulin Glargine (1 Unit Dial) Inject 6 Units into the skin daily.                         Durable Medical Equipment  (From admission, onward)                        Start     Ordered    09/26/18 1127  For home use only DME Hospital bed  Once    Question:  Bed type  Answer:  Semi-electric   09/26/18 1126    09/26/18 1126  For home use only DME oxygen  Once    Question Answer Comment  Mode or (Route) Nasal cannula   Liters per Minute 3   Frequency Continuous (stationary and portable oxygen unit needed)   Oxygen delivery system Gas      09/26/18 1126             Relevant Imaging Results:  Relevant Lab Results:   Additional Information ssn: [id number]; hospice to follow  Lia Hopping, LCSW

## 2018-10-04 ENCOUNTER — Inpatient Hospital Stay: Payer: Medicare Other

## 2018-10-11 ENCOUNTER — Other Ambulatory Visit: Payer: Medicare Other

## 2018-10-17 ENCOUNTER — Ambulatory Visit: Payer: Medicare Other

## 2018-10-17 ENCOUNTER — Ambulatory Visit: Payer: Medicare Other | Admitting: Internal Medicine

## 2018-10-17 ENCOUNTER — Other Ambulatory Visit: Payer: Medicare Other

## 2018-10-31 DEATH — deceased

## 2020-09-10 IMAGING — PT NM PET TUM IMG INITIAL (PI) SKULL BASE T - THIGH
9 series · 19 of 25 positions shown · non-contrast
Comparison: CT chest 06/26/2018

CLINICAL DATA: Initial treatment strategy for lung mass. History of
stage IV renal cell carcinoma

EXAM:
NUCLEAR MEDICINE PET SKULL BASE TO THIGH
TECHNIQUE: 9.1 mCi F-18 FDG was injected intravenously. Full-ring PET imaging
was performed from the skull base to thigh after the radiotracer. CT
data was obtained and used for attenuation correction and anatomic
localization.
Fasting blood glucose: 144 mg/dl

[Series 3: pet sk_thigh ac · axial · 5.0mm · 4.07mm/px · z∈[-830,+138]mm · 3 of 243 slices shown]
[im 1/243]
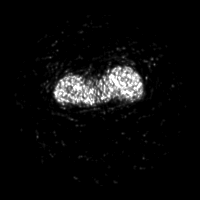
[im 81/243]
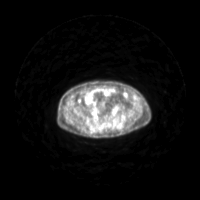
[im 243/243]
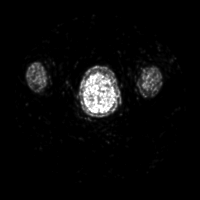

[Series 4: ct sk_thigh 5.0 b31f · axial · 5.0mm · 0.98mm/px · z∈[-830,+138]mm · 4 of 243 slices shown]
[im 1/243]
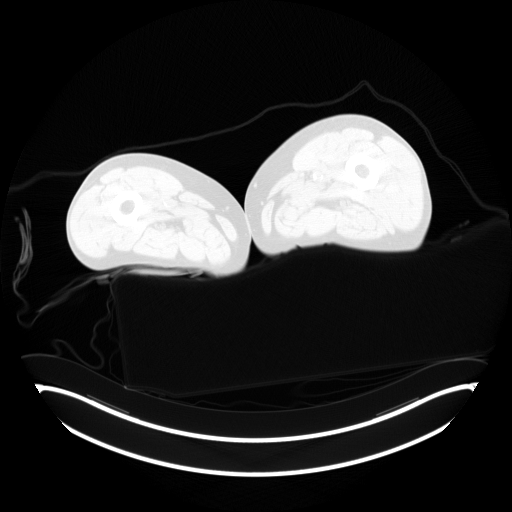
[im 61/243]
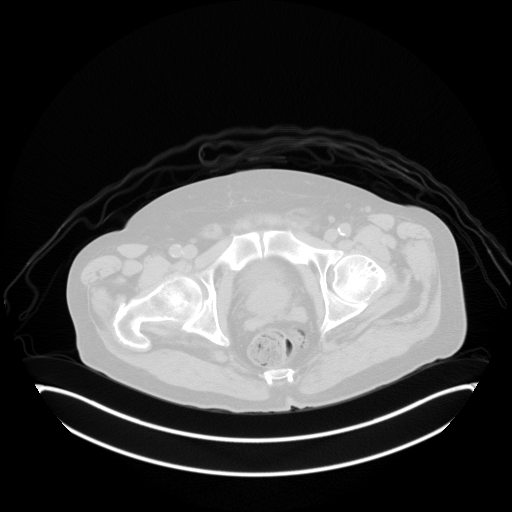
[im 182/243]
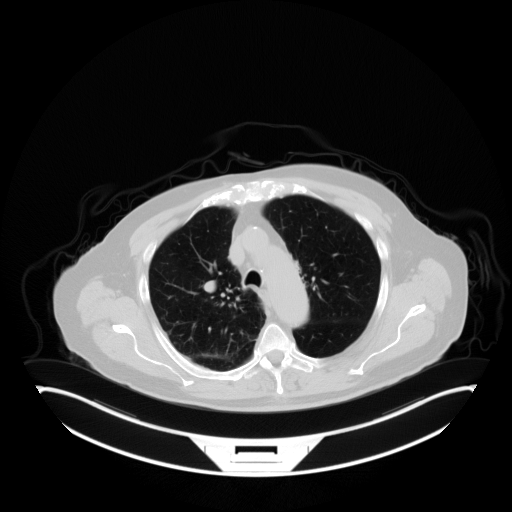
[im 243/243  brain]
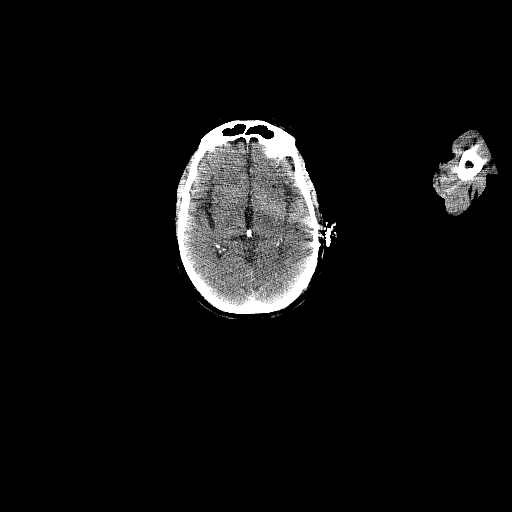

[Series 5: pet sk_thigh nac · axial · 5.0mm · 4.07mm/px · z∈[-830,+138]mm · 4 of 243 slices shown]
[im 1/243]
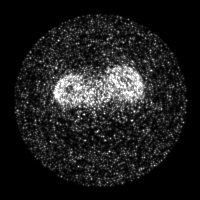
[im 122/243]
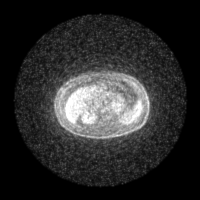
[im 182/243]
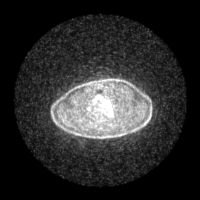
[im 243/243]
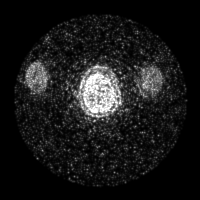

[Series 8: ct sk_thigh 5.0 (id) lung_bone · axial · 5.0mm · 0.72mm/px · 1 of 76 slices shown]
[im 76/76  bone]
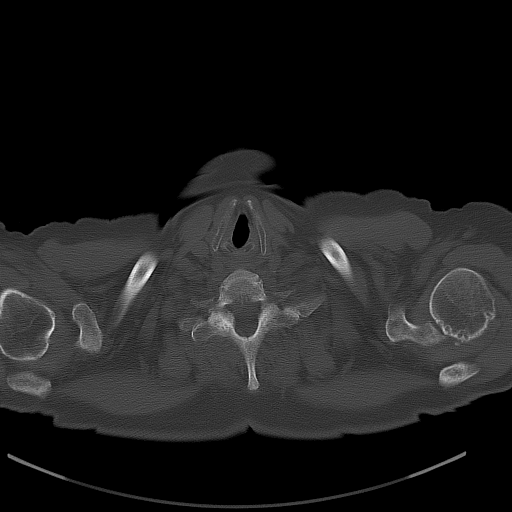

[Series 603: range-ct sk_thigh 5.0 (id)<alpha range> · 1 of 65 slices shown (1 of 2)]
[im 1/65]
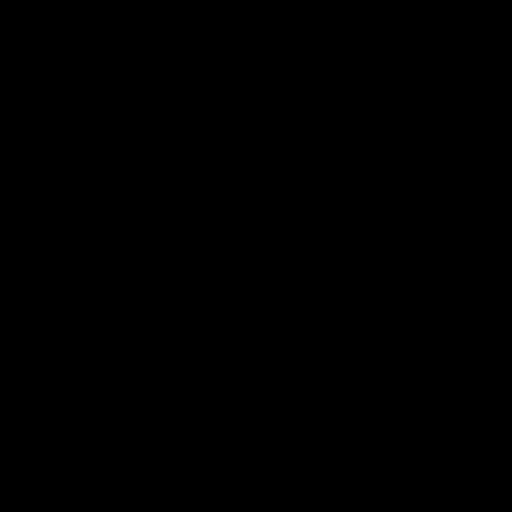

[Series 604: mip range · coronal · 2.01mm/px · 1 of 32 slices shown]
[im 1/32]
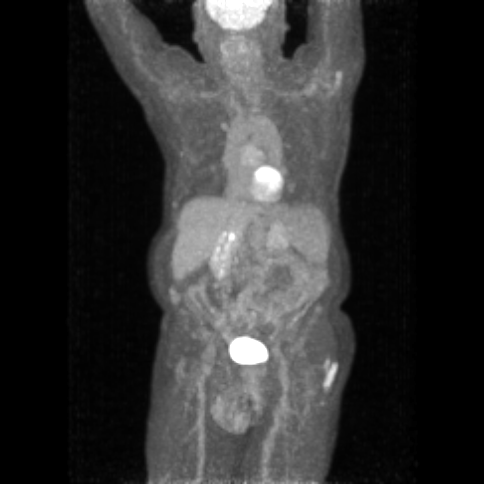

[Series 605: range-ct sk_thigh 5.0 (id)<alpha range> · 3 of 238 slices shown (2 of 2)]
[im 60/238]
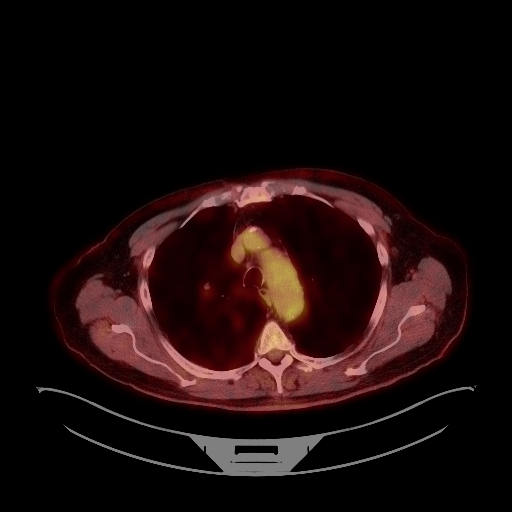
[im 119/238]
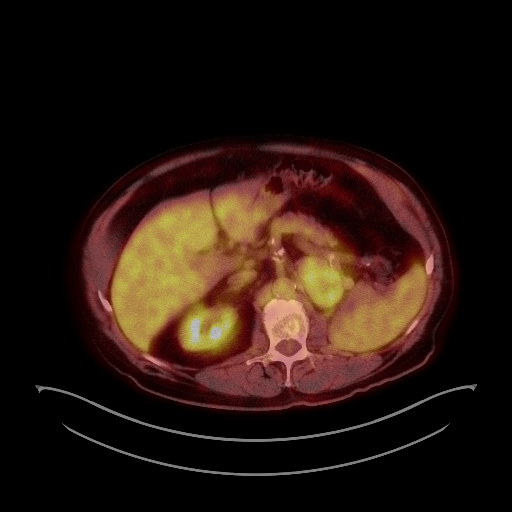
[im 178/238]
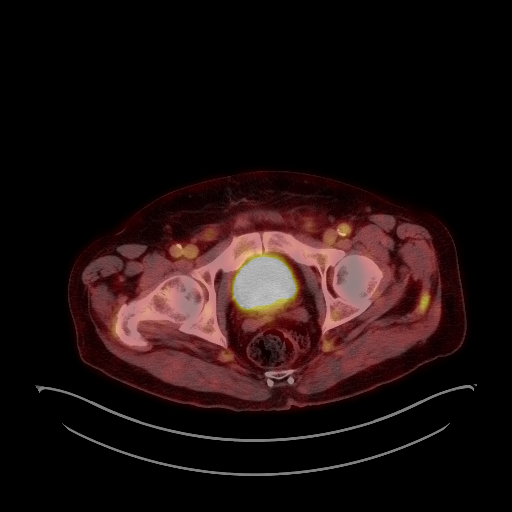

[Series 1067: results mm oncology reading · 1.0mm · 0.50mm/px · 1 of 6 slices shown (1 of 2)]
[im 1/6]
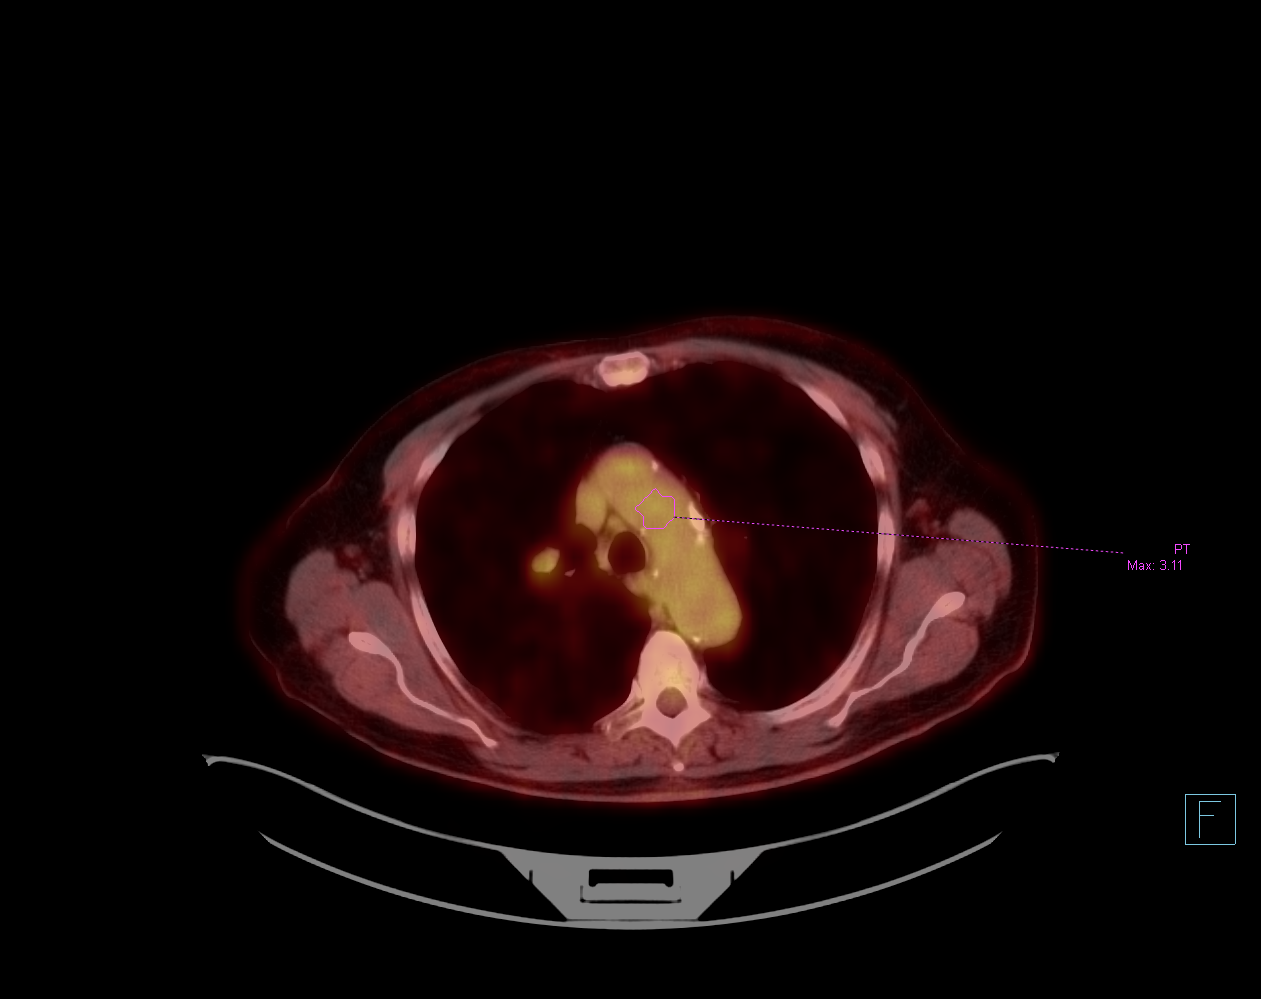

[Series 1087: results mm oncology reading · 4.0mm · 1.17mm/px · 1 of 2 slices shown (2 of 2)]
[im 1/2]
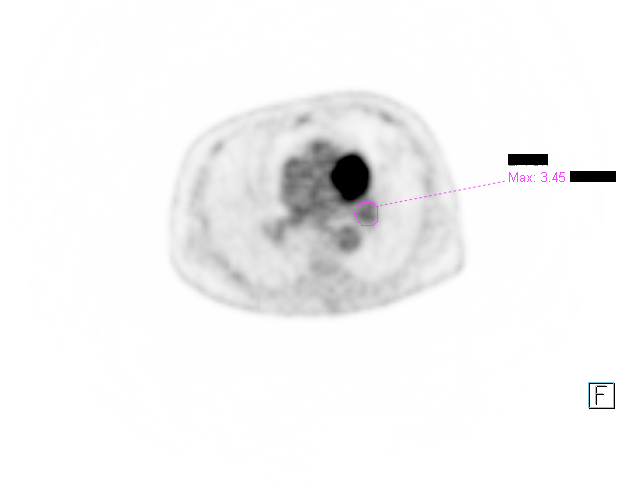

[19 of 25 positions shown; findings below may reference images not displayed]

FINDINGS: Mediastinal blood pool activity: SUV max

NECK: No hypermetabolic lymph nodes in the neck.

Incidental CT findings: none

CHEST: No hypermetabolic axillary or supraclavicular lymph nodes.
The lower left paratracheal lymph nodes measure up to 1 cm within
SUV max of 3.2. On CT chest from 04/20/2011 this node measured
cm. Large subcarinal lymph node measures 4.2 cm and has an SUV max
5.18. This is new when compared with 04/20/2011. On 06/26/2018 this
measured 4.4 cm.

Scattered pulmonary nodules are identified within both lungs. Index
nodules include:

-subpleural nodule within the anteromedial left lower lobe measures
2.9 cm and has an SUV max 3.45. On 06/26/2018 this nodule measured
3.1 cm.

-subpleural nodule within the anteromedial left base posterior to
the left ventricle measures 1.7 cm and has an SUV max 3.03.
Previously this measured 1.8 cm.

-the largest nodule is in the medial right lower lobe measuring 2 cm
within SUV max 2.58. New from 04/20/2011. On 06/26/2018 this
measured 2.3 mm.

-the tubular configured nodule within the right upper lobe adjacent
to airway thickening and obstruction the measures 1.9 cm and has an
SUV max 3.1. most recently this measured 2.0 cm.

Incidental CT findings: none

ABDOMEN/PELVIS: No abnormal radiotracer activity within the liver,
pancreas, or spleen. Normal right adrenal gland. Left adrenal gland
mass measures 5.1 cm and is new from 04/20/2011. On 12/24/2017 this
measured 5.3 cm. This has an SUV max 4.78. Normal right adrenal
gland. No hypermetabolic lymph nodes within the abdomen or pelvis.

Incidental CT findings: Calcified granulomas identified within the
spleen. Status post left nephrectomy. Right kidney cysts are again
noted. Incompletely characterized without IV contrast. Aortic
atherosclerosis. Aneurysmal dilatation of the common iliac arteries
noted. The right common iliac artery measures 3 cm in maximum
diameter, image 155/4.

SKELETON: Muscular uptake identified adjacent to the greater
trochanter of the proximal left femur is identified. This may be
postinflammatory or related to prior trauma. No suspicious
hypermetabolic bone lesions.

Incidental CT findings: none
IMPRESSION: 1. Overall stable to slight improvement in metastatic disease within
the chest and upper abdomen compared with 06/26/2018. No significant
areas of progressive disease noted.
2. Multifocal pulmonary nodules are stable to mildly decreased in
size in the interval.
3. Hypermetabolic subcarinal lymph node is mildly decreased in size
in the interval. No progressive adenopathy identified.
4. Left adrenal gland metastasis is slightly decreased in size in
the interval.
5.  Aortic Atherosclerosis ([id number]-[id number]).
